# Patient Record
Sex: Female | Born: 1945 | Race: White | Hispanic: No | Smoking: Never smoker
Health system: Southern US, Community
[De-identification: ages and names within clinical notes are randomized; demographics above are authoritative.]

## PROBLEM LIST (undated history)

## (undated) DIAGNOSIS — M810 Age-related osteoporosis without current pathological fracture: Secondary | ICD-10-CM

## (undated) DIAGNOSIS — I8390 Asymptomatic varicose veins of unspecified lower extremity: Secondary | ICD-10-CM

## (undated) DIAGNOSIS — M609 Myositis, unspecified: Secondary | ICD-10-CM

## (undated) DIAGNOSIS — M76829 Posterior tibial tendinitis, unspecified leg: Secondary | ICD-10-CM

## (undated) DIAGNOSIS — R011 Cardiac murmur, unspecified: Secondary | ICD-10-CM

## (undated) DIAGNOSIS — L501 Idiopathic urticaria: Secondary | ICD-10-CM

## (undated) DIAGNOSIS — R7401 Elevation of levels of liver transaminase levels: Secondary | ICD-10-CM

## (undated) DIAGNOSIS — R918 Other nonspecific abnormal finding of lung field: Secondary | ICD-10-CM

## (undated) DIAGNOSIS — I10 Essential (primary) hypertension: Secondary | ICD-10-CM

## (undated) DIAGNOSIS — K760 Fatty (change of) liver, not elsewhere classified: Secondary | ICD-10-CM

## (undated) DIAGNOSIS — E7800 Pure hypercholesterolemia, unspecified: Secondary | ICD-10-CM

## (undated) DIAGNOSIS — E039 Hypothyroidism, unspecified: Secondary | ICD-10-CM

## (undated) DIAGNOSIS — D3A090 Benign carcinoid tumor of the bronchus and lung: Secondary | ICD-10-CM

## (undated) DIAGNOSIS — R7303 Prediabetes: Secondary | ICD-10-CM

## (undated) DIAGNOSIS — Z8719 Personal history of other diseases of the digestive system: Secondary | ICD-10-CM

## (undated) DIAGNOSIS — I739 Peripheral vascular disease, unspecified: Secondary | ICD-10-CM

## (undated) HISTORY — PX: KNEE SURGERY: SHX244

## (undated) HISTORY — DX: Other nonspecific abnormal finding of lung field: R91.8

## (undated) HISTORY — DX: Hypothyroidism, unspecified: E03.9

## (undated) HISTORY — DX: Cardiac murmur, unspecified: R01.1

## (undated) HISTORY — PX: REPLACEMENT TOTAL KNEE BILATERAL: SUR1225

## (undated) HISTORY — DX: Posterior tibial tendinitis, unspecified leg: M76.829

## (undated) HISTORY — PX: ABDOMINAL HYSTERECTOMY: SHX81

## (undated) HISTORY — PX: JOINT REPLACEMENT: SHX530

## (undated) HISTORY — DX: Benign carcinoid tumor of the bronchus and lung: D3A.090

## (undated) HISTORY — DX: Asymptomatic varicose veins of unspecified lower extremity: I83.90

## (undated) HISTORY — DX: Elevation of levels of liver transaminase levels: R74.01

## (undated) HISTORY — DX: Age-related osteoporosis without current pathological fracture: M81.0

## (undated) HISTORY — DX: Essential (primary) hypertension: I10

## (undated) HISTORY — DX: Idiopathic urticaria: L50.1

## (undated) HISTORY — DX: Prediabetes: R73.03

## (undated) HISTORY — PX: CHOLECYSTECTOMY: SHX55

## (undated) HISTORY — PX: TONSILLECTOMY: SUR1361

## (undated) HISTORY — PX: BUNIONECTOMY: SHX129

## (undated) HISTORY — DX: Fatty (change of) liver, not elsewhere classified: K76.0

## (undated) HISTORY — PX: SIGMOIDECTOMY: SHX176

## (undated) HISTORY — PX: COLONOSCOPY W/ POLYPECTOMY: SHX1380

## (undated) HISTORY — PX: TRANSTHORACIC ECHOCARDIOGRAM: SHX275

## (undated) HISTORY — DX: Myositis, unspecified: M60.9

## (undated) HISTORY — DX: Pure hypercholesterolemia, unspecified: E78.00

## (undated) HISTORY — PX: CT CTA CORONARY W/CA SCORE W/CM &/OR WO/CM: HXRAD787

---
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
## 2019-05-23 DIAGNOSIS — C801 Malignant (primary) neoplasm, unspecified: Secondary | ICD-10-CM

## 2019-05-23 HISTORY — PX: EYE SURGERY: SHX253

## 2019-05-23 HISTORY — DX: Malignant (primary) neoplasm, unspecified: C80.1

## 2019-06-27 DIAGNOSIS — H04123 Dry eye syndrome of bilateral lacrimal glands: Secondary | ICD-10-CM | POA: Diagnosis not present

## 2019-06-27 DIAGNOSIS — H527 Unspecified disorder of refraction: Secondary | ICD-10-CM | POA: Diagnosis not present

## 2019-06-27 DIAGNOSIS — Z961 Presence of intraocular lens: Secondary | ICD-10-CM | POA: Diagnosis not present

## 2019-06-27 DIAGNOSIS — D3131 Benign neoplasm of right choroid: Secondary | ICD-10-CM | POA: Diagnosis not present

## 2019-06-27 DIAGNOSIS — H26493 Other secondary cataract, bilateral: Secondary | ICD-10-CM | POA: Diagnosis not present

## 2019-07-08 DIAGNOSIS — M9901 Segmental and somatic dysfunction of cervical region: Secondary | ICD-10-CM | POA: Diagnosis not present

## 2019-07-08 DIAGNOSIS — M9905 Segmental and somatic dysfunction of pelvic region: Secondary | ICD-10-CM | POA: Diagnosis not present

## 2019-07-08 DIAGNOSIS — M9903 Segmental and somatic dysfunction of lumbar region: Secondary | ICD-10-CM | POA: Diagnosis not present

## 2019-07-08 DIAGNOSIS — M9904 Segmental and somatic dysfunction of sacral region: Secondary | ICD-10-CM | POA: Diagnosis not present

## 2019-07-09 DIAGNOSIS — M9905 Segmental and somatic dysfunction of pelvic region: Secondary | ICD-10-CM | POA: Diagnosis not present

## 2019-07-09 DIAGNOSIS — M9901 Segmental and somatic dysfunction of cervical region: Secondary | ICD-10-CM | POA: Diagnosis not present

## 2019-07-09 DIAGNOSIS — M9903 Segmental and somatic dysfunction of lumbar region: Secondary | ICD-10-CM | POA: Diagnosis not present

## 2019-07-09 DIAGNOSIS — M9904 Segmental and somatic dysfunction of sacral region: Secondary | ICD-10-CM | POA: Diagnosis not present

## 2019-07-09 DIAGNOSIS — L501 Idiopathic urticaria: Secondary | ICD-10-CM | POA: Diagnosis not present

## 2019-07-11 DIAGNOSIS — M9904 Segmental and somatic dysfunction of sacral region: Secondary | ICD-10-CM | POA: Diagnosis not present

## 2019-07-11 DIAGNOSIS — M9903 Segmental and somatic dysfunction of lumbar region: Secondary | ICD-10-CM | POA: Diagnosis not present

## 2019-07-11 DIAGNOSIS — M9905 Segmental and somatic dysfunction of pelvic region: Secondary | ICD-10-CM | POA: Diagnosis not present

## 2019-07-11 DIAGNOSIS — M9901 Segmental and somatic dysfunction of cervical region: Secondary | ICD-10-CM | POA: Diagnosis not present

## 2019-07-14 DIAGNOSIS — M9901 Segmental and somatic dysfunction of cervical region: Secondary | ICD-10-CM | POA: Diagnosis not present

## 2019-07-14 DIAGNOSIS — M9903 Segmental and somatic dysfunction of lumbar region: Secondary | ICD-10-CM | POA: Diagnosis not present

## 2019-07-14 DIAGNOSIS — M9904 Segmental and somatic dysfunction of sacral region: Secondary | ICD-10-CM | POA: Diagnosis not present

## 2019-07-14 DIAGNOSIS — M9905 Segmental and somatic dysfunction of pelvic region: Secondary | ICD-10-CM | POA: Diagnosis not present

## 2019-07-16 DIAGNOSIS — M9901 Segmental and somatic dysfunction of cervical region: Secondary | ICD-10-CM | POA: Diagnosis not present

## 2019-07-16 DIAGNOSIS — M9904 Segmental and somatic dysfunction of sacral region: Secondary | ICD-10-CM | POA: Diagnosis not present

## 2019-07-16 DIAGNOSIS — M9903 Segmental and somatic dysfunction of lumbar region: Secondary | ICD-10-CM | POA: Diagnosis not present

## 2019-07-16 DIAGNOSIS — M9905 Segmental and somatic dysfunction of pelvic region: Secondary | ICD-10-CM | POA: Diagnosis not present

## 2019-07-17 DIAGNOSIS — M9901 Segmental and somatic dysfunction of cervical region: Secondary | ICD-10-CM | POA: Diagnosis not present

## 2019-07-17 DIAGNOSIS — M9905 Segmental and somatic dysfunction of pelvic region: Secondary | ICD-10-CM | POA: Diagnosis not present

## 2019-07-17 DIAGNOSIS — M9903 Segmental and somatic dysfunction of lumbar region: Secondary | ICD-10-CM | POA: Diagnosis not present

## 2019-07-17 DIAGNOSIS — M9904 Segmental and somatic dysfunction of sacral region: Secondary | ICD-10-CM | POA: Diagnosis not present

## 2019-07-22 DIAGNOSIS — M9903 Segmental and somatic dysfunction of lumbar region: Secondary | ICD-10-CM | POA: Diagnosis not present

## 2019-07-22 DIAGNOSIS — M9901 Segmental and somatic dysfunction of cervical region: Secondary | ICD-10-CM | POA: Diagnosis not present

## 2019-07-22 DIAGNOSIS — M9905 Segmental and somatic dysfunction of pelvic region: Secondary | ICD-10-CM | POA: Diagnosis not present

## 2019-07-22 DIAGNOSIS — M9904 Segmental and somatic dysfunction of sacral region: Secondary | ICD-10-CM | POA: Diagnosis not present

## 2019-07-24 DIAGNOSIS — M9901 Segmental and somatic dysfunction of cervical region: Secondary | ICD-10-CM | POA: Diagnosis not present

## 2019-07-24 DIAGNOSIS — M9905 Segmental and somatic dysfunction of pelvic region: Secondary | ICD-10-CM | POA: Diagnosis not present

## 2019-07-24 DIAGNOSIS — M9904 Segmental and somatic dysfunction of sacral region: Secondary | ICD-10-CM | POA: Diagnosis not present

## 2019-07-24 DIAGNOSIS — M9903 Segmental and somatic dysfunction of lumbar region: Secondary | ICD-10-CM | POA: Diagnosis not present

## 2019-07-25 DIAGNOSIS — M9903 Segmental and somatic dysfunction of lumbar region: Secondary | ICD-10-CM | POA: Diagnosis not present

## 2019-07-25 DIAGNOSIS — M9904 Segmental and somatic dysfunction of sacral region: Secondary | ICD-10-CM | POA: Diagnosis not present

## 2019-07-25 DIAGNOSIS — M9905 Segmental and somatic dysfunction of pelvic region: Secondary | ICD-10-CM | POA: Diagnosis not present

## 2019-07-25 DIAGNOSIS — M9901 Segmental and somatic dysfunction of cervical region: Secondary | ICD-10-CM | POA: Diagnosis not present

## 2019-07-29 DIAGNOSIS — M9905 Segmental and somatic dysfunction of pelvic region: Secondary | ICD-10-CM | POA: Diagnosis not present

## 2019-07-29 DIAGNOSIS — M9904 Segmental and somatic dysfunction of sacral region: Secondary | ICD-10-CM | POA: Diagnosis not present

## 2019-07-29 DIAGNOSIS — M9903 Segmental and somatic dysfunction of lumbar region: Secondary | ICD-10-CM | POA: Diagnosis not present

## 2019-07-29 DIAGNOSIS — M9901 Segmental and somatic dysfunction of cervical region: Secondary | ICD-10-CM | POA: Diagnosis not present

## 2019-07-30 DIAGNOSIS — M1990 Unspecified osteoarthritis, unspecified site: Secondary | ICD-10-CM | POA: Diagnosis not present

## 2019-07-30 DIAGNOSIS — Z1211 Encounter for screening for malignant neoplasm of colon: Secondary | ICD-10-CM | POA: Diagnosis not present

## 2019-07-31 DIAGNOSIS — M9904 Segmental and somatic dysfunction of sacral region: Secondary | ICD-10-CM | POA: Diagnosis not present

## 2019-07-31 DIAGNOSIS — M9901 Segmental and somatic dysfunction of cervical region: Secondary | ICD-10-CM | POA: Diagnosis not present

## 2019-07-31 DIAGNOSIS — Z1211 Encounter for screening for malignant neoplasm of colon: Secondary | ICD-10-CM | POA: Diagnosis not present

## 2019-07-31 DIAGNOSIS — M9903 Segmental and somatic dysfunction of lumbar region: Secondary | ICD-10-CM | POA: Diagnosis not present

## 2019-07-31 DIAGNOSIS — M9905 Segmental and somatic dysfunction of pelvic region: Secondary | ICD-10-CM | POA: Diagnosis not present

## 2019-08-01 DIAGNOSIS — H26493 Other secondary cataract, bilateral: Secondary | ICD-10-CM | POA: Diagnosis not present

## 2019-08-04 DIAGNOSIS — M9904 Segmental and somatic dysfunction of sacral region: Secondary | ICD-10-CM | POA: Diagnosis not present

## 2019-08-04 DIAGNOSIS — M9905 Segmental and somatic dysfunction of pelvic region: Secondary | ICD-10-CM | POA: Diagnosis not present

## 2019-08-04 DIAGNOSIS — M9901 Segmental and somatic dysfunction of cervical region: Secondary | ICD-10-CM | POA: Diagnosis not present

## 2019-08-04 DIAGNOSIS — M9903 Segmental and somatic dysfunction of lumbar region: Secondary | ICD-10-CM | POA: Diagnosis not present

## 2019-08-06 DIAGNOSIS — L501 Idiopathic urticaria: Secondary | ICD-10-CM | POA: Diagnosis not present

## 2019-08-07 DIAGNOSIS — M9905 Segmental and somatic dysfunction of pelvic region: Secondary | ICD-10-CM | POA: Diagnosis not present

## 2019-08-07 DIAGNOSIS — M9904 Segmental and somatic dysfunction of sacral region: Secondary | ICD-10-CM | POA: Diagnosis not present

## 2019-08-07 DIAGNOSIS — M9903 Segmental and somatic dysfunction of lumbar region: Secondary | ICD-10-CM | POA: Diagnosis not present

## 2019-08-07 DIAGNOSIS — M9901 Segmental and somatic dysfunction of cervical region: Secondary | ICD-10-CM | POA: Diagnosis not present

## 2019-08-08 DIAGNOSIS — H26491 Other secondary cataract, right eye: Secondary | ICD-10-CM | POA: Diagnosis not present

## 2019-08-11 DIAGNOSIS — M9904 Segmental and somatic dysfunction of sacral region: Secondary | ICD-10-CM | POA: Diagnosis not present

## 2019-08-11 DIAGNOSIS — M9901 Segmental and somatic dysfunction of cervical region: Secondary | ICD-10-CM | POA: Diagnosis not present

## 2019-08-11 DIAGNOSIS — M9903 Segmental and somatic dysfunction of lumbar region: Secondary | ICD-10-CM | POA: Diagnosis not present

## 2019-08-11 DIAGNOSIS — M9905 Segmental and somatic dysfunction of pelvic region: Secondary | ICD-10-CM | POA: Diagnosis not present

## 2019-08-14 DIAGNOSIS — M9904 Segmental and somatic dysfunction of sacral region: Secondary | ICD-10-CM | POA: Diagnosis not present

## 2019-08-14 DIAGNOSIS — M9903 Segmental and somatic dysfunction of lumbar region: Secondary | ICD-10-CM | POA: Diagnosis not present

## 2019-08-14 DIAGNOSIS — M9901 Segmental and somatic dysfunction of cervical region: Secondary | ICD-10-CM | POA: Diagnosis not present

## 2019-08-14 DIAGNOSIS — M9905 Segmental and somatic dysfunction of pelvic region: Secondary | ICD-10-CM | POA: Diagnosis not present

## 2019-08-18 DIAGNOSIS — M9905 Segmental and somatic dysfunction of pelvic region: Secondary | ICD-10-CM | POA: Diagnosis not present

## 2019-08-18 DIAGNOSIS — M9903 Segmental and somatic dysfunction of lumbar region: Secondary | ICD-10-CM | POA: Diagnosis not present

## 2019-08-18 DIAGNOSIS — M9904 Segmental and somatic dysfunction of sacral region: Secondary | ICD-10-CM | POA: Diagnosis not present

## 2019-08-18 DIAGNOSIS — M9901 Segmental and somatic dysfunction of cervical region: Secondary | ICD-10-CM | POA: Diagnosis not present

## 2019-08-19 DIAGNOSIS — H26492 Other secondary cataract, left eye: Secondary | ICD-10-CM | POA: Diagnosis not present

## 2019-08-21 DIAGNOSIS — M9903 Segmental and somatic dysfunction of lumbar region: Secondary | ICD-10-CM | POA: Diagnosis not present

## 2019-08-21 DIAGNOSIS — M9901 Segmental and somatic dysfunction of cervical region: Secondary | ICD-10-CM | POA: Diagnosis not present

## 2019-08-21 DIAGNOSIS — M9905 Segmental and somatic dysfunction of pelvic region: Secondary | ICD-10-CM | POA: Diagnosis not present

## 2019-08-21 DIAGNOSIS — M9904 Segmental and somatic dysfunction of sacral region: Secondary | ICD-10-CM | POA: Diagnosis not present

## 2019-08-25 DIAGNOSIS — M9903 Segmental and somatic dysfunction of lumbar region: Secondary | ICD-10-CM | POA: Diagnosis not present

## 2019-08-25 DIAGNOSIS — M9901 Segmental and somatic dysfunction of cervical region: Secondary | ICD-10-CM | POA: Diagnosis not present

## 2019-08-25 DIAGNOSIS — M9904 Segmental and somatic dysfunction of sacral region: Secondary | ICD-10-CM | POA: Diagnosis not present

## 2019-08-25 DIAGNOSIS — M9905 Segmental and somatic dysfunction of pelvic region: Secondary | ICD-10-CM | POA: Diagnosis not present

## 2019-08-28 DIAGNOSIS — M9905 Segmental and somatic dysfunction of pelvic region: Secondary | ICD-10-CM | POA: Diagnosis not present

## 2019-08-28 DIAGNOSIS — M9904 Segmental and somatic dysfunction of sacral region: Secondary | ICD-10-CM | POA: Diagnosis not present

## 2019-08-28 DIAGNOSIS — M9903 Segmental and somatic dysfunction of lumbar region: Secondary | ICD-10-CM | POA: Diagnosis not present

## 2019-08-28 DIAGNOSIS — M9901 Segmental and somatic dysfunction of cervical region: Secondary | ICD-10-CM | POA: Diagnosis not present

## 2019-09-01 DIAGNOSIS — M9905 Segmental and somatic dysfunction of pelvic region: Secondary | ICD-10-CM | POA: Diagnosis not present

## 2019-09-01 DIAGNOSIS — M9901 Segmental and somatic dysfunction of cervical region: Secondary | ICD-10-CM | POA: Diagnosis not present

## 2019-09-01 DIAGNOSIS — M9903 Segmental and somatic dysfunction of lumbar region: Secondary | ICD-10-CM | POA: Diagnosis not present

## 2019-09-01 DIAGNOSIS — M9904 Segmental and somatic dysfunction of sacral region: Secondary | ICD-10-CM | POA: Diagnosis not present

## 2019-09-03 DIAGNOSIS — L501 Idiopathic urticaria: Secondary | ICD-10-CM | POA: Diagnosis not present

## 2019-09-04 DIAGNOSIS — M9904 Segmental and somatic dysfunction of sacral region: Secondary | ICD-10-CM | POA: Diagnosis not present

## 2019-09-04 DIAGNOSIS — M9905 Segmental and somatic dysfunction of pelvic region: Secondary | ICD-10-CM | POA: Diagnosis not present

## 2019-09-04 DIAGNOSIS — M9903 Segmental and somatic dysfunction of lumbar region: Secondary | ICD-10-CM | POA: Diagnosis not present

## 2019-09-04 DIAGNOSIS — M9901 Segmental and somatic dysfunction of cervical region: Secondary | ICD-10-CM | POA: Diagnosis not present

## 2019-09-04 DIAGNOSIS — D3613 Benign neoplasm of peripheral nerves and autonomic nervous system of lower limb, including hip: Secondary | ICD-10-CM | POA: Diagnosis not present

## 2019-09-08 DIAGNOSIS — M9905 Segmental and somatic dysfunction of pelvic region: Secondary | ICD-10-CM | POA: Diagnosis not present

## 2019-09-08 DIAGNOSIS — M9904 Segmental and somatic dysfunction of sacral region: Secondary | ICD-10-CM | POA: Diagnosis not present

## 2019-09-08 DIAGNOSIS — M9903 Segmental and somatic dysfunction of lumbar region: Secondary | ICD-10-CM | POA: Diagnosis not present

## 2019-09-08 DIAGNOSIS — M9901 Segmental and somatic dysfunction of cervical region: Secondary | ICD-10-CM | POA: Diagnosis not present

## 2019-09-09 DIAGNOSIS — R195 Other fecal abnormalities: Secondary | ICD-10-CM | POA: Diagnosis not present

## 2019-09-09 DIAGNOSIS — Z9049 Acquired absence of other specified parts of digestive tract: Secondary | ICD-10-CM | POA: Diagnosis not present

## 2019-09-09 DIAGNOSIS — K56699 Other intestinal obstruction unspecified as to partial versus complete obstruction: Secondary | ICD-10-CM | POA: Diagnosis not present

## 2019-09-11 DIAGNOSIS — M9904 Segmental and somatic dysfunction of sacral region: Secondary | ICD-10-CM | POA: Diagnosis not present

## 2019-09-11 DIAGNOSIS — M9905 Segmental and somatic dysfunction of pelvic region: Secondary | ICD-10-CM | POA: Diagnosis not present

## 2019-09-11 DIAGNOSIS — M9903 Segmental and somatic dysfunction of lumbar region: Secondary | ICD-10-CM | POA: Diagnosis not present

## 2019-09-11 DIAGNOSIS — M9901 Segmental and somatic dysfunction of cervical region: Secondary | ICD-10-CM | POA: Diagnosis not present

## 2019-09-15 DIAGNOSIS — M9905 Segmental and somatic dysfunction of pelvic region: Secondary | ICD-10-CM | POA: Diagnosis not present

## 2019-09-15 DIAGNOSIS — M9904 Segmental and somatic dysfunction of sacral region: Secondary | ICD-10-CM | POA: Diagnosis not present

## 2019-09-15 DIAGNOSIS — M9903 Segmental and somatic dysfunction of lumbar region: Secondary | ICD-10-CM | POA: Diagnosis not present

## 2019-09-15 DIAGNOSIS — M9901 Segmental and somatic dysfunction of cervical region: Secondary | ICD-10-CM | POA: Diagnosis not present

## 2019-09-16 ENCOUNTER — Other Ambulatory Visit: Payer: Self-pay | Admitting: Gastroenterology

## 2019-09-16 DIAGNOSIS — R195 Other fecal abnormalities: Secondary | ICD-10-CM

## 2019-09-18 DIAGNOSIS — M9905 Segmental and somatic dysfunction of pelvic region: Secondary | ICD-10-CM | POA: Diagnosis not present

## 2019-09-18 DIAGNOSIS — M9901 Segmental and somatic dysfunction of cervical region: Secondary | ICD-10-CM | POA: Diagnosis not present

## 2019-09-18 DIAGNOSIS — M9903 Segmental and somatic dysfunction of lumbar region: Secondary | ICD-10-CM | POA: Diagnosis not present

## 2019-09-18 DIAGNOSIS — M9904 Segmental and somatic dysfunction of sacral region: Secondary | ICD-10-CM | POA: Diagnosis not present

## 2019-09-22 DIAGNOSIS — M9905 Segmental and somatic dysfunction of pelvic region: Secondary | ICD-10-CM | POA: Diagnosis not present

## 2019-09-22 DIAGNOSIS — M9904 Segmental and somatic dysfunction of sacral region: Secondary | ICD-10-CM | POA: Diagnosis not present

## 2019-09-22 DIAGNOSIS — M9903 Segmental and somatic dysfunction of lumbar region: Secondary | ICD-10-CM | POA: Diagnosis not present

## 2019-09-22 DIAGNOSIS — M9901 Segmental and somatic dysfunction of cervical region: Secondary | ICD-10-CM | POA: Diagnosis not present

## 2019-09-24 DIAGNOSIS — M9904 Segmental and somatic dysfunction of sacral region: Secondary | ICD-10-CM | POA: Diagnosis not present

## 2019-09-24 DIAGNOSIS — M9903 Segmental and somatic dysfunction of lumbar region: Secondary | ICD-10-CM | POA: Diagnosis not present

## 2019-09-24 DIAGNOSIS — M9901 Segmental and somatic dysfunction of cervical region: Secondary | ICD-10-CM | POA: Diagnosis not present

## 2019-09-24 DIAGNOSIS — M9905 Segmental and somatic dysfunction of pelvic region: Secondary | ICD-10-CM | POA: Diagnosis not present

## 2019-09-25 DIAGNOSIS — H35372 Puckering of macula, left eye: Secondary | ICD-10-CM | POA: Diagnosis not present

## 2019-09-25 DIAGNOSIS — H527 Unspecified disorder of refraction: Secondary | ICD-10-CM | POA: Diagnosis not present

## 2019-09-25 DIAGNOSIS — H26493 Other secondary cataract, bilateral: Secondary | ICD-10-CM | POA: Diagnosis not present

## 2019-09-29 DIAGNOSIS — M9903 Segmental and somatic dysfunction of lumbar region: Secondary | ICD-10-CM | POA: Diagnosis not present

## 2019-09-29 DIAGNOSIS — M9904 Segmental and somatic dysfunction of sacral region: Secondary | ICD-10-CM | POA: Diagnosis not present

## 2019-09-29 DIAGNOSIS — M9901 Segmental and somatic dysfunction of cervical region: Secondary | ICD-10-CM | POA: Diagnosis not present

## 2019-09-29 DIAGNOSIS — M9905 Segmental and somatic dysfunction of pelvic region: Secondary | ICD-10-CM | POA: Diagnosis not present

## 2019-10-01 ENCOUNTER — Ambulatory Visit: Payer: Medicare PPO | Admitting: Podiatry

## 2019-10-01 DIAGNOSIS — L501 Idiopathic urticaria: Secondary | ICD-10-CM | POA: Diagnosis not present

## 2019-10-02 ENCOUNTER — Ambulatory Visit
Admission: RE | Admit: 2019-10-02 | Discharge: 2019-10-02 | Disposition: A | Discharge status: Home / Self Care | Payer: Medicare PPO | Source: Ambulatory Visit | Attending: Gastroenterology | Admitting: Gastroenterology

## 2019-10-02 DIAGNOSIS — R195 Other fecal abnormalities: Secondary | ICD-10-CM

## 2019-10-02 DIAGNOSIS — K5730 Diverticulosis of large intestine without perforation or abscess without bleeding: Secondary | ICD-10-CM | POA: Diagnosis not present

## 2019-10-03 ENCOUNTER — Other Ambulatory Visit: Payer: Self-pay | Admitting: Gastroenterology

## 2019-10-03 DIAGNOSIS — R933 Abnormal findings on diagnostic imaging of other parts of digestive tract: Secondary | ICD-10-CM

## 2019-10-03 DIAGNOSIS — R918 Other nonspecific abnormal finding of lung field: Secondary | ICD-10-CM

## 2019-10-06 ENCOUNTER — Other Ambulatory Visit: Payer: Self-pay | Admitting: Gastroenterology

## 2019-10-06 ENCOUNTER — Ambulatory Visit
Admission: RE | Admit: 2019-10-06 | Discharge: 2019-10-06 | Disposition: A | Discharge status: Home / Self Care | Payer: Medicare PPO | Source: Ambulatory Visit | Attending: Gastroenterology | Admitting: Gastroenterology

## 2019-10-06 DIAGNOSIS — R918 Other nonspecific abnormal finding of lung field: Secondary | ICD-10-CM

## 2019-10-06 DIAGNOSIS — M9904 Segmental and somatic dysfunction of sacral region: Secondary | ICD-10-CM | POA: Diagnosis not present

## 2019-10-06 DIAGNOSIS — R911 Solitary pulmonary nodule: Secondary | ICD-10-CM | POA: Diagnosis not present

## 2019-10-06 DIAGNOSIS — R9389 Abnormal findings on diagnostic imaging of other specified body structures: Secondary | ICD-10-CM

## 2019-10-06 DIAGNOSIS — M9901 Segmental and somatic dysfunction of cervical region: Secondary | ICD-10-CM | POA: Diagnosis not present

## 2019-10-06 DIAGNOSIS — M9905 Segmental and somatic dysfunction of pelvic region: Secondary | ICD-10-CM | POA: Diagnosis not present

## 2019-10-06 DIAGNOSIS — R933 Abnormal findings on diagnostic imaging of other parts of digestive tract: Secondary | ICD-10-CM

## 2019-10-06 DIAGNOSIS — M9903 Segmental and somatic dysfunction of lumbar region: Secondary | ICD-10-CM | POA: Diagnosis not present

## 2019-10-06 MED ORDER — IOPAMIDOL (ISOVUE-300) INJECTION 61%
75.0000 mL | Freq: Once | INTRAVENOUS | Status: AC | PRN
  Administered 2019-10-06: 75 mL via INTRAVENOUS

## 2019-10-10 ENCOUNTER — Other Ambulatory Visit: Payer: Self-pay

## 2019-10-10 ENCOUNTER — Encounter: Payer: Self-pay | Admitting: Emergency Medicine

## 2019-10-10 ENCOUNTER — Ambulatory Visit (INDEPENDENT_AMBULATORY_CARE_PROVIDER_SITE_OTHER): Payer: Medicare PPO | Admitting: Emergency Medicine

## 2019-10-10 VITALS — BP 124/72 | HR 67 | Temp 97.5°F | Ht 66.0 in | Wt 155.0 lb

## 2019-10-10 DIAGNOSIS — R918 Other nonspecific abnormal finding of lung field: Secondary | ICD-10-CM | POA: Diagnosis not present

## 2019-10-10 DIAGNOSIS — R911 Solitary pulmonary nodule: Secondary | ICD-10-CM

## 2019-10-10 NOTE — Patient Instructions (Addendum)
Agree with your PET scan as planned We will arrange for navigational bronchoscopy by Dr. Lamonte Sakai to better characterize  right middle lobe nodule We will discuss maintenance of Xolair going forward Follow with Dr Lamonte Sakai in 1 month

## 2019-10-10 NOTE — Assessment & Plan Note (Signed)
2.8 x 1.6 right middle lobe nodule, 7 x 5 mm right upper lobe nodule concerning for malignancy.  Agree with the PET scan that is already scheduled for 10/16/2019.  I believe we need to proceed with scheduling navigational bronchoscopy to approach the right middle lobe lesion.  If the PET scan reveals an alternative target then we will cancel the ENB.  Procedure explained to the patient including risk and benefits.  She agrees to proceed with scheduling.

## 2019-10-10 NOTE — H&P (View-Only) (Signed)
Subjective:    Patient ID: Cynthia Rhodes, female    DOB: [birth year], 74 y.o.   MRN: [id number]  HPI 74 year old never smoker with a history of hypothyroidism and allergic disease on Xolair, scheduled to have another injection in June, being managed Dr Curly Rim.  She is referred today for evaluation of a 3.3 lobulated anterior right middle lobe mass that was originally seen on virtual colonoscopy 10/02/2019.  This prompted a CT chest done on 10/06/2019 which I have reviewed, shows a 2.8 x 1.6 spiculated right middle lobe mass concerning for primary malignancy.  Also noted was a 7 x 5 mm nodule in the right upper lobe.  A PET scan has been ordered and is planned for 5/27.     Review of Systems  Past Medical History:  Diagnosis Date  . Hypothyroidism      Family History  Problem Relation Age of Onset  . Asthma Mother   . 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   Social History   Socioeconomic History  . Marital status: Widowed    Spouse name: Not on file  . Number of children: Not on file  . Years of education: Not on file  . Highest education level: Not on file  Occupational History  . Not on file  Tobacco Use  . Smoking status: Never Smoker  . Smokeless tobacco: Never Used  Substance and Sexual Activity  . Alcohol use: Not on file  . Drug use: Not on file  . Sexual activity: Not on file  Other Topics Concern  . Not on file  Social History Narrative  . Not on file   Social Determinants of Health   Financial Resource Strain:   . Difficulty of Paying Living Expenses:   Food Insecurity:   . Worried About Charity fundraiser in the Last Year:   . Arboriculturist in the Last Year:   Transportation Needs:   . Film/video editor (Medical):   [name] Lack of Transportation (Non-Medical):   Physical Activity:   . Days of Exercise per Week:   . Minutes of Exercise per Session:   Stress:   .  Feeling of Stress :   Social Connections:   . Frequency of Communication with Friends and Family:   . Frequency of Social Gatherings with Friends and Family:   . Attends Religious Services:   . Active Member of Clubs or Organizations:   . Attends Archivist Meetings:   [name] Marital Status:   Intimate Partner Violence:   . Fear of Current or Ex-Partner:   . Emotionally Abused:   [name] Physically Abused:   . Sexually Abused:      Not on File   Outpatient Medications Prior to Visit  Medication Sig Dispense Refill  . levothyroxine (SYNTHROID) 50 MCG tablet Take 50 mcg by mouth daily. Every other day     No facility-administered medications prior to visit.        Objective:   Physical Exam Vitals:   10/10/19 1003  BP: 124/72  Pulse: 67  Temp: (!) 97.5 F (36.4 C)  TempSrc: Temporal  SpO2: 98%  Weight: 155 lb (70.3 kg)  Height: 5\' 6"  (1.676 m)   Gen: Pleasant, well-nourished, in no distress,  normal affect  ENT: No lesions,  mouth clear,  oropharynx clear, no postnasal drip  Neck: No JVD, no  stridor  Lungs: No use of accessory muscles, no crackles or wheezing on normal respiration, no wheeze on forced expiration  Cardiovascular: RRR, heart sounds normal, no murmur or gallops, no peripheral edema  Musculoskeletal: No deformities, no cyanosis or clubbing  Neuro: alert, awake, non focal  Skin: Warm, no lesions or rash       Assessment & Plan:  Pulmonary nodules 2.8 x 1.6 right middle lobe nodule, 7 x 5 mm right upper lobe nodule concerning for malignancy.  Agree with the PET scan that is already scheduled for 10/16/2019.  I believe we need to proceed with scheduling navigational bronchoscopy to approach the right middle lobe lesion.  If the PET scan reveals an alternative target then we will cancel the ENB.  Procedure explained to the patient including risk and benefits.  She agrees to proceed with scheduling.  Baltazar Apo, MD, PhD 10/10/2019, 5:14 PM [place]  [facility] [phone] or if no answer [phone]

## 2019-10-10 NOTE — Progress Notes (Signed)
Subjective:    Patient ID: Cynthia Rhodes, female    DOB: [birth year], 74 y.o.   MRN: [id number]  HPI 74 year old never smoker with a history of hypothyroidism and allergic disease on Xolair, scheduled to have another injection in June, being managed Dr Curly Rim.  She is referred today for evaluation of a 3.3 lobulated anterior right middle lobe mass that was originally seen on virtual colonoscopy 10/02/2019.  This prompted a CT chest done on 10/06/2019 which I have reviewed, shows a 2.8 x 1.6 spiculated right middle lobe mass concerning for primary malignancy.  Also noted was a 7 x 5 mm nodule in the right upper lobe.  A PET scan has been ordered and is planned for 5/27.     Review of Systems  Past Medical History:  Diagnosis Date  . Hypothyroidism      Family History  Problem Relation Age of Onset  . Asthma Mother   . 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   Social History   Socioeconomic History  . Marital status: Widowed    Spouse name: Not on file  . Number of children: Not on file  . Years of education: Not on file  . Highest education level: Not on file  Occupational History  . Not on file  Tobacco Use  . Smoking status: Never Smoker  . Smokeless tobacco: Never Used  Substance and Sexual Activity  . Alcohol use: Not on file  . Drug use: Not on file  . Sexual activity: Not on file  Other Topics Concern  . Not on file  Social History Narrative  . Not on file   Social Determinants of Health   Financial Resource Strain:   . Difficulty of Paying Living Expenses:   Food Insecurity:   . Worried About Charity fundraiser in the Last Year:   . Arboriculturist in the Last Year:   Transportation Needs:   . Film/video editor (Medical):   [name] Lack of Transportation (Non-Medical):   Physical Activity:   . Days of Exercise per Week:   . Minutes of Exercise per Session:   Stress:   .  Feeling of Stress :   Social Connections:   . Frequency of Communication with Friends and Family:   . Frequency of Social Gatherings with Friends and Family:   . Attends Religious Services:   . Active Member of Clubs or Organizations:   . Attends Archivist Meetings:   [name] Marital Status:   Intimate Partner Violence:   . Fear of Current or Ex-Partner:   . Emotionally Abused:   [name] Physically Abused:   . Sexually Abused:      Not on File   Outpatient Medications Prior to Visit  Medication Sig Dispense Refill  . levothyroxine (SYNTHROID) 50 MCG tablet Take 50 mcg by mouth daily. Every other day     No facility-administered medications prior to visit.        Objective:   Physical Exam Vitals:   10/10/19 1003  BP: 124/72  Pulse: 67  Temp: (!) 97.5 F (36.4 C)  TempSrc: Temporal  SpO2: 98%  Weight: 155 lb (70.3 kg)  Height: 5\' 6"  (1.676 m)   Gen: Pleasant, well-nourished, in no distress,  normal affect  ENT: No lesions,  mouth clear,  oropharynx clear, no postnasal drip  Neck: No JVD, no  stridor  Lungs: No use of accessory muscles, no crackles or wheezing on normal respiration, no wheeze on forced expiration  Cardiovascular: RRR, heart sounds normal, no murmur or gallops, no peripheral edema  Musculoskeletal: No deformities, no cyanosis or clubbing  Neuro: alert, awake, non focal  Skin: Warm, no lesions or rash       Assessment & Plan:  Pulmonary nodules 2.8 x 1.6 right middle lobe nodule, 7 x 5 mm right upper lobe nodule concerning for malignancy.  Agree with the PET scan that is already scheduled for 10/16/2019.  I believe we need to proceed with scheduling navigational bronchoscopy to approach the right middle lobe lesion.  If the PET scan reveals an alternative target then we will cancel the ENB.  Procedure explained to the patient including risk and benefits.  She agrees to proceed with scheduling.  Baltazar Apo, MD, PhD 10/10/2019, 5:14 PM [place]  [facility] [phone] or if no answer [phone]

## 2019-10-15 ENCOUNTER — Other Ambulatory Visit: Payer: Self-pay | Admitting: Family Medicine

## 2019-10-15 DIAGNOSIS — Z13820 Encounter for screening for osteoporosis: Secondary | ICD-10-CM

## 2019-10-16 ENCOUNTER — Other Ambulatory Visit: Payer: Self-pay

## 2019-10-16 ENCOUNTER — Encounter (HOSPITAL_COMMUNITY)
Admission: RE | Admit: 2019-10-16 | Discharge: 2019-10-16 | Disposition: A | Discharge status: Home / Self Care | Payer: Medicare PPO | Source: Ambulatory Visit | Attending: Gastroenterology | Admitting: Gastroenterology

## 2019-10-16 DIAGNOSIS — R918 Other nonspecific abnormal finding of lung field: Secondary | ICD-10-CM | POA: Diagnosis not present

## 2019-10-16 DIAGNOSIS — R9389 Abnormal findings on diagnostic imaging of other specified body structures: Secondary | ICD-10-CM

## 2019-10-16 LAB — GLUCOSE, CAPILLARY: Glucose-Capillary: 111 mg/dL — ABNORMAL HIGH (ref 70–99)

## 2019-10-16 MED ORDER — FLUDEOXYGLUCOSE F - 18 (FDG) INJECTION
7.7000 | Freq: Once | INTRAVENOUS | Status: AC
  Administered 2019-10-16: 7.7 via INTRAVENOUS

## 2019-10-21 ENCOUNTER — Other Ambulatory Visit: Payer: Self-pay | Admitting: Gastroenterology

## 2019-10-21 ENCOUNTER — Telehealth: Payer: Self-pay | Admitting: Emergency Medicine

## 2019-10-21 DIAGNOSIS — R9389 Abnormal findings on diagnostic imaging of other specified body structures: Secondary | ICD-10-CM

## 2019-10-21 NOTE — Telephone Encounter (Signed)
I agree - she should get the mammogram

## 2019-10-21 NOTE — Telephone Encounter (Signed)
Pt is scheduled for routine mammogram  Recently had PET scan  Wants to know if still needs to have mammogram  I asked that she call her PCP but she states her PCP is out of the office for a while I advised most likely will need to keep mammo bc this is much different from PET and focuses specifically on the breast  Please advise thanks!

## 2019-10-22 ENCOUNTER — Encounter: Payer: Self-pay | Admitting: Podiatry

## 2019-10-22 ENCOUNTER — Ambulatory Visit (INDEPENDENT_AMBULATORY_CARE_PROVIDER_SITE_OTHER): Payer: Medicare PPO

## 2019-10-22 ENCOUNTER — Ambulatory Visit: Payer: Medicare PPO

## 2019-10-22 ENCOUNTER — Ambulatory Visit (INDEPENDENT_AMBULATORY_CARE_PROVIDER_SITE_OTHER): Payer: Medicare PPO | Admitting: Podiatry

## 2019-10-22 ENCOUNTER — Other Ambulatory Visit: Payer: Self-pay

## 2019-10-22 DIAGNOSIS — G5762 Lesion of plantar nerve, left lower limb: Secondary | ICD-10-CM

## 2019-10-22 MED ORDER — MELOXICAM 15 MG PO TABS
15.0000 mg | ORAL_TABLET | Freq: Every day | ORAL | 1 refills | Status: DC
Start: 2019-10-22 — End: 2019-12-10

## 2019-10-22 NOTE — Telephone Encounter (Signed)
Called spoke with patient, let her know Dr. Sudie Bailey' recommendations, she states she will get the mammogram rescheduled ASAP   Nothing further needed at this time

## 2019-10-26 NOTE — Progress Notes (Signed)
   HPI: 74 y.o. female presenting today as a new patient for evaluation of a burning sensation on the left forefoot.  This is been ongoing for approximately 1 year now.  Had a sudden onset.  Patient denies injury.  Aggravated when she is walking she has the burning sensation.  She has tried orthotics which helped temporarily.  Past Medical History:  Diagnosis Date  . Hypothyroidism      Physical Exam: General: The patient is alert and oriented x3 in no acute distress.  Dermatology: Skin is warm, dry and supple bilateral lower extremities. Negative for open lesions or macerations.  Vascular: Palpable pedal pulses bilaterally. No edema or erythema noted. Capillary refill within normal limits.  Neurological: Epicritic and protective threshold grossly intact bilaterally.   Musculoskeletal Exam: Range of motion within normal limits to all pedal and ankle joints bilateral. Muscle strength 5/5 in all groups bilateral.  Pain on palpation noted to the left second interspace as well as pain with lateral compression and a positive Mulder sign.  Findings consistent with a Morton's neuroma left second interspace  Radiographic Exam:  Normal osseous mineralization. Joint spaces preserved. No fracture/dislocation/boney destruction.    Assessment: 1.  Morton's neuroma left second interspace   Plan of Care:  1. Patient evaluated. X-Rays reviewed.  2.  Injection of 0.5 cc Celestone Soluspan injection of the second interspace left foot 3.  Prescription for meloxicam 15 mg take daily 4.  Offloading metatarsal pads were provided and applied to the insoles 5.  Return to clinic in 4 weeks      Edrick Kins, DPM [facility]  Dr. Edrick Kins, DPM    [address], [place] [address]                Office [phone]  Fax [phone]

## 2019-10-29 ENCOUNTER — Ambulatory Visit
Admission: RE | Admit: 2019-10-29 | Discharge: 2019-10-29 | Disposition: A | Discharge status: Home / Self Care | Payer: Medicare PPO | Source: Ambulatory Visit | Attending: Gastroenterology | Admitting: Gastroenterology

## 2019-10-29 DIAGNOSIS — L501 Idiopathic urticaria: Secondary | ICD-10-CM | POA: Diagnosis not present

## 2019-10-29 DIAGNOSIS — E041 Nontoxic single thyroid nodule: Secondary | ICD-10-CM | POA: Diagnosis not present

## 2019-10-29 DIAGNOSIS — R9389 Abnormal findings on diagnostic imaging of other specified body structures: Secondary | ICD-10-CM

## 2019-10-31 DIAGNOSIS — Z1231 Encounter for screening mammogram for malignant neoplasm of breast: Secondary | ICD-10-CM | POA: Diagnosis not present

## 2019-11-01 ENCOUNTER — Other Ambulatory Visit (HOSPITAL_COMMUNITY)
Admission: RE | Admit: 2019-11-01 | Discharge: 2019-11-01 | Disposition: A | Discharge status: Home / Self Care | Payer: Medicare PPO | Source: Ambulatory Visit | Attending: Emergency Medicine | Admitting: Emergency Medicine

## 2019-11-01 ENCOUNTER — Encounter (HOSPITAL_COMMUNITY): Payer: Self-pay | Admitting: Emergency Medicine

## 2019-11-01 DIAGNOSIS — Z20822 Contact with and (suspected) exposure to covid-19: Secondary | ICD-10-CM | POA: Diagnosis not present

## 2019-11-01 DIAGNOSIS — Z01812 Encounter for preprocedural laboratory examination: Secondary | ICD-10-CM | POA: Insufficient documentation

## 2019-11-01 LAB — SARS CORONAVIRUS 2 (TAT 6-24 HRS): SARS Coronavirus 2: NEGATIVE

## 2019-11-01 NOTE — Progress Notes (Signed)
Patient denies chest pain, shortness of breath, or cardiology visit. Patient states she had a stress test in [place] about 3- 5 years ago. Reports at the time she was not having any cardiac issues. Educated on Environmental manager. Reminded about need for quarantine until after procedure.

## 2019-11-04 ENCOUNTER — Encounter (HOSPITAL_COMMUNITY)
Admission: RE | Disposition: A | Discharge status: Home / Self Care | Payer: Self-pay | Source: Home / Self Care | Attending: Emergency Medicine

## 2019-11-04 ENCOUNTER — Ambulatory Visit (HOSPITAL_COMMUNITY)
Admission: RE | Admit: 2019-11-04 | Discharge: 2019-11-04 | Disposition: A | Discharge status: Home / Self Care | Payer: Medicare PPO | Attending: Emergency Medicine | Admitting: Emergency Medicine

## 2019-11-04 ENCOUNTER — Ambulatory Visit (HOSPITAL_COMMUNITY): Payer: Medicare PPO

## 2019-11-04 ENCOUNTER — Encounter (HOSPITAL_COMMUNITY): Payer: Self-pay | Admitting: Emergency Medicine

## 2019-11-04 ENCOUNTER — Ambulatory Visit (HOSPITAL_COMMUNITY): Payer: Medicare PPO | Admitting: Anesthesiology

## 2019-11-04 DIAGNOSIS — E039 Hypothyroidism, unspecified: Secondary | ICD-10-CM | POA: Diagnosis not present

## 2019-11-04 DIAGNOSIS — R918 Other nonspecific abnormal finding of lung field: Secondary | ICD-10-CM | POA: Diagnosis not present

## 2019-11-04 DIAGNOSIS — Z9889 Other specified postprocedural states: Secondary | ICD-10-CM

## 2019-11-04 DIAGNOSIS — R911 Solitary pulmonary nodule: Secondary | ICD-10-CM | POA: Diagnosis not present

## 2019-11-04 DIAGNOSIS — J9811 Atelectasis: Secondary | ICD-10-CM | POA: Diagnosis not present

## 2019-11-04 HISTORY — PX: BRONCHIAL NEEDLE ASPIRATION BIOPSY: SHX5106

## 2019-11-04 HISTORY — PX: VIDEO BRONCHOSCOPY WITH ENDOBRONCHIAL NAVIGATION: SHX6175

## 2019-11-04 HISTORY — PX: BRONCHIAL BIOPSY: SHX5109

## 2019-11-04 HISTORY — PX: BRONCHIAL BRUSHINGS: SHX5108

## 2019-11-04 HISTORY — PX: BRONCHIAL WASHINGS: SHX5105

## 2019-11-04 LAB — CBC
HCT: 44.9 % (ref 36.0–46.0)
Hemoglobin: 14.3 g/dL (ref 12.0–15.0)
MCH: 29.9 pg (ref 26.0–34.0)
MCHC: 31.8 g/dL (ref 30.0–36.0)
MCV: 93.9 fL (ref 80.0–100.0)
Platelets: 333 10*3/uL (ref 150–400)
RBC: 4.78 MIL/uL (ref 3.87–5.11)
RDW: 13.4 % (ref 11.5–15.5)
WBC: 6.9 10*3/uL (ref 4.0–10.5)
nRBC: 0 % (ref 0.0–0.2)

## 2019-11-04 LAB — COMPREHENSIVE METABOLIC PANEL
ALT: 42 U/L (ref 0–44)
AST: 45 U/L — ABNORMAL HIGH (ref 15–41)
Albumin: 4 g/dL (ref 3.5–5.0)
Alkaline Phosphatase: 98 U/L (ref 38–126)
Anion gap: 8 (ref 5–15)
BUN: 20 mg/dL (ref 8–23)
CO2: 27 mmol/L (ref 22–32)
Calcium: 9.4 mg/dL (ref 8.9–10.3)
Chloride: 106 mmol/L (ref 98–111)
Creatinine, Ser: 0.83 mg/dL (ref 0.44–1.00)
GFR calc Af Amer: >60 mL/min (ref >60)
GFR calc non Af Amer: >60 mL/min (ref >60)
Glucose, Bld: 99 mg/dL (ref 70–99)
Potassium: 3.9 mmol/L (ref 3.5–5.1)
Sodium: 141 mmol/L (ref 135–145)
Total Bilirubin: 0.5 mg/dL (ref 0.3–1.2)
Total Protein: 6.8 g/dL (ref 6.5–8.1)

## 2019-11-04 LAB — APTT: aPTT: 30 seconds (ref 24–36)

## 2019-11-04 LAB — PROTIME-INR
INR: 1 (ref 0.8–1.2)
Prothrombin Time: 13.1 seconds (ref 11.4–15.2)

## 2019-11-04 SURGERY — VIDEO BRONCHOSCOPY WITH ENDOBRONCHIAL NAVIGATION
Anesthesia: General

## 2019-11-04 MED ORDER — LACTATED RINGERS IV SOLN: INTRAVENOUS | Status: DC | PRN

## 2019-11-04 MED ORDER — LIDOCAINE 2% (20 MG/ML) 5 ML SYRINGE
INTRAMUSCULAR | Status: DC | PRN
  Administered 2019-11-04: 40 mg via INTRAVENOUS

## 2019-11-04 MED ORDER — ONDANSETRON HCL 4 MG/2ML IJ SOLN: 4.0000 mg | Freq: Once | INTRAMUSCULAR | Status: DC | PRN

## 2019-11-04 MED ORDER — ONDANSETRON HCL 4 MG/2ML IJ SOLN
INTRAMUSCULAR | Status: DC | PRN
  Administered 2019-11-04: 4 mg via INTRAVENOUS

## 2019-11-04 MED ORDER — ROCURONIUM BROMIDE 10 MG/ML (PF) SYRINGE
PREFILLED_SYRINGE | INTRAVENOUS | Status: DC | PRN
  Administered 2019-11-04: 60 mg via INTRAVENOUS

## 2019-11-04 MED ORDER — FENTANYL CITRATE (PF) 100 MCG/2ML IJ SOLN: 25.0000 ug | INTRAMUSCULAR | Status: DC | PRN

## 2019-11-04 MED ORDER — SUGAMMADEX SODIUM 200 MG/2ML IV SOLN
INTRAVENOUS | Status: DC | PRN
  Administered 2019-11-04: 200 mg via INTRAVENOUS

## 2019-11-04 MED ORDER — CHLORHEXIDINE GLUCONATE 0.12 % MT SOLN
OROMUCOSAL | Status: AC
  Administered 2019-11-04: 15 mL
  Filled 2019-11-04: qty 15

## 2019-11-04 MED ORDER — PROPOFOL 10 MG/ML IV BOLUS
INTRAVENOUS | Status: DC | PRN
  Administered 2019-11-04: 160 mg via INTRAVENOUS

## 2019-11-04 MED ORDER — FENTANYL CITRATE (PF) 250 MCG/5ML IJ SOLN
INTRAMUSCULAR | Status: DC | PRN
  Administered 2019-11-04: 50 ug via INTRAVENOUS

## 2019-11-04 MED ORDER — EPHEDRINE SULFATE 50 MG/ML IJ SOLN
INTRAMUSCULAR | Status: DC | PRN
  Administered 2019-11-04: 5 mg via INTRAVENOUS

## 2019-11-04 MED ORDER — DEXAMETHASONE SODIUM PHOSPHATE 10 MG/ML IJ SOLN
INTRAMUSCULAR | Status: DC | PRN
  Administered 2019-11-04: 5 mg via INTRAVENOUS

## 2019-11-04 NOTE — Op Note (Signed)
Video Bronchoscopy with Electromagnetic Navigation Procedure Note  Date of Operation: 11/04/2019  Pre-op Diagnosis: Right middle lobe nodule, right upper lobe nodule  Post-op Diagnosis: Same  Surgeon: Baltazar Apo  Assistants: None  Anesthesia: General endotracheal anesthesia  Operation: Flexible video fiberoptic bronchoscopy with electromagnetic navigation and biopsies.  Estimated Blood Loss: Minimal  Complications: None apparent  Indications and History: Cynthia Rhodes is a 74 y.o. female, never smoker, with hypothyroidism and allergies on Xolair.  Found to have 3.3 lobulated anterior right middle lobe mass on virtual colonoscopy 10/02/2019.  CT scan and PET scan performed, showed hypermetabolism.  The right upper lobe nodules too small to be fully evaluated by PET.  The risks, benefits, complications, treatment options and expected outcomes were discussed with the patient.  The possibilities of pneumothorax, pneumonia, reaction to medication, pulmonary aspiration, perforation of a viscus, bleeding, failure to diagnose a condition and creating a complication requiring transfusion or operation were discussed with the patient who freely signed the consent.    Description of Procedure: The patient was seen in the Preoperative Area, was examined and was deemed appropriate to proceed.  The patient was taken to [facility] endoscopy room 2, identified as Cynthia Rhodes and the procedure verified as Flexible Video Fiberoptic Bronchoscopy.  A Time Out was held and the above information confirmed.   Prior to the date of the procedure a high-resolution CT scan of the chest was performed. Utilizing [place] a virtual tracheobronchial tree was generated to allow the creation of distinct navigation pathways to the patient's parenchymal abnormalities. After being taken to the operating room general anesthesia was initiated and the patient  was orally intubated. The video fiberoptic  bronchoscope was introduced via the endotracheal tube and a general inspection was performed which showed normal airways throughout.  There were no endobronchial lesions or abnormal secretions seen. The extendable working channel and locator guide were introduced into the bronchoscope. The distinct navigation pathways prepared prior to this procedure were then utilized to navigate to within 0.5-0.8 cm of the center of patient's right middle lobe mass identified on CT scan. The extendable working channel was secured into place and the locator guide was withdrawn. Under fluoroscopic guidance transbronchial needle brushings, transbronchial Wang needle biopsies, and transbronchial forceps biopsies were performed to be sent for cytology and pathology. A bronchioalveolar lavage was performed in the right middle lobe and sent for cytology and microbiology (bacterial, fungal, AFB smears and cultures). At the end of the procedure a general airway inspection was performed and there was no evidence of active bleeding. The bronchoscope was removed.  The patient tolerated the procedure well. There was no significant blood loss and there were no obvious complications. A post-procedural chest x-ray is pending.  Samples: 1. Transbronchial needle brushings from right middle lobe mass 2. Transbronchial Wang needle biopsies from right middle lobe mass 3. Transbronchial forceps biopsies from right middle lobe mass 4. Bronchoalveolar lavage from right middle lobe  Plans:  The patient will be discharged from the PACU to home when recovered from anesthesia and after chest x-ray is reviewed. We will review the cytology, pathology and microbiology results with the patient when they become available. Outpatient followup will be with Dr Lamonte Sakai.    Baltazar Apo, MD, PhD 11/04/2019, 9:07 AM [place] [facility] [phone] or if no answer [phone]

## 2019-11-04 NOTE — Anesthesia Postprocedure Evaluation (Signed)
Anesthesia Post Note  Patient: Cynthia Rhodes  Procedure(s) Performed: VIDEO BRONCHOSCOPY WITH ENDOBRONCHIAL NAVIGATION (N/A ) BRONCHIAL BRUSHINGS BRONCHIAL NEEDLE ASPIRATION BIOPSIES BRONCHIAL BIOPSIES BRONCHIAL WASHINGS     Patient location during evaluation: PACU Anesthesia Type: General Level of consciousness: awake and alert Pain management: pain level controlled Vital Signs Assessment: post-procedure vital signs reviewed and stable Respiratory status: spontaneous breathing, nonlabored ventilation and respiratory function stable Cardiovascular status: blood pressure returned to baseline and stable Postop Assessment: no apparent nausea or vomiting Anesthetic complications: no   No complications documented.  Last Vitals:  Vitals:   11/04/19 1021 11/04/19 1022  BP: (!) 144/61   Pulse: 68 64  Resp: 16 19  Temp: 36.6 C   SpO2: 93% 95%    Last Pain:  Vitals:   11/04/19 1021  TempSrc:   PainSc: 0-No pain                 Lidia Collum

## 2019-11-04 NOTE — Anesthesia Procedure Notes (Signed)
Procedure Name: Intubation Date/Time: 11/04/2019 7:36 AM Performed by: Alain Marion, CRNA Pre-anesthesia Checklist: Patient identified, Emergency Drugs available, Suction available and Patient being monitored Patient Re-evaluated:Patient Re-evaluated prior to induction Oxygen Delivery Method: Circle System Utilized Preoxygenation: Pre-oxygenation with 100% oxygen Induction Type: IV induction Ventilation: Mask ventilation without difficulty Laryngoscope Size: Mac and 3 Grade View: Grade I Tube type: Oral Tube size: 8.5 mm Number of attempts: 1 Airway Equipment and Method: Stylet Placement Confirmation: ETT inserted through vocal cords under direct vision,  positive ETCO2 and breath sounds checked- equal and bilateral Secured at: 20 cm Tube secured with: Tape Dental Injury: Teeth and Oropharynx as per pre-operative assessment  Comments: Performed by Reece Agar, SRNA under supervision of CRNA and MDA

## 2019-11-04 NOTE — Anesthesia Preprocedure Evaluation (Signed)
Anesthesia Evaluation  Patient identified by MRN, date of birth, ID band Patient awake    Reviewed: Allergy & Precautions, NPO status , Patient's Chart, lab work & pertinent test results  Airway Mallampati: II  TM Distance: >3 FB Neck ROM: Full    Dental  (+) Teeth Intact, Dental Advisory Given   Pulmonary    breath sounds clear to auscultation       Cardiovascular  Rhythm:Regular Rate:Normal     Neuro/Psych    GI/Hepatic   Endo/Other    Renal/GU      Musculoskeletal   Abdominal   Peds  Hematology   Anesthesia Other Findings   Reproductive/Obstetrics                             Anesthesia Physical Anesthesia Plan  ASA: II  Anesthesia Plan: General   Post-op Pain Management:    Induction: Intravenous  PONV Risk Score and Plan: Ondansetron and Dexamethasone  Airway Management Planned: Oral ETT  Additional Equipment:   Intra-op Plan:   Post-operative Plan: Extubation in OR  Informed Consent: I have reviewed the patients History and Physical, chart, labs and discussed the procedure including the risks, benefits and alternatives for the proposed anesthesia with the patient or authorized representative who has indicated his/her understanding and acceptance.     Dental advisory given  Plan Discussed with: CRNA and Anesthesiologist  Anesthesia Plan Comments:         Anesthesia Quick Evaluation

## 2019-11-04 NOTE — Interval H&P Note (Signed)
History and Physical Interval Note:  11/04/2019 7:26 AM  Donah Driver  has presented today for surgery, with the diagnosis of LUNG NODULE.  The various methods of treatment have been discussed with the patient and family. After consideration of risks, benefits and other options for treatment, the patient has consented to  Procedure(s): [place] (N/A) as a surgical intervention.  The patient's history has been reviewed, patient examined, no change in status, stable for surgery.  I have reviewed the patient's chart and labs.  Questions were answered to the patient's satisfaction.     Collene Gobble

## 2019-11-04 NOTE — Transfer of Care (Signed)
Immediate Anesthesia Transfer of Care Note  Patient: Anneliese E Haze  Procedure(s) Performed: VIDEO BRONCHOSCOPY WITH ENDOBRONCHIAL NAVIGATION (N/A ) BRONCHIAL BRUSHINGS BRONCHIAL NEEDLE ASPIRATION BIOPSIES BRONCHIAL BIOPSIES BRONCHIAL WASHINGS  Patient Location: PACU  Anesthesia Type:General  Level of Consciousness: awake and alert   Airway & Oxygen Therapy: Patient Spontanous Breathing and Patient connected to face mask oxygen  Post-op Assessment: Report given to RN, Post -op Vital signs reviewed and stable and Patient moving all extremities  Post vital signs: Reviewed and stable  Last Vitals:  Vitals Value Taken Time  BP 166/74 11/04/19 0915  Temp    Pulse 65 11/04/19 0916  Resp 16 11/04/19 0916  SpO2 100 % 11/04/19 0916  Vitals shown include unvalidated device data.  Last Pain:  Vitals:   11/04/19 0629  TempSrc: Oral  PainSc: 0-No pain         Complications: No complications documented.

## 2019-11-04 NOTE — Discharge Instructions (Signed)
Flexible Bronchoscopy, Care After This sheet gives you information about how to care for yourself after your test. Your doctor may also give you more specific instructions. If you have problems or questions, contact your doctor. Follow these instructions at home: Eating and drinking  Do not eat or drink anything (not even water) for 2 hours after your test, or until your numbing medicine (local anesthetic) wears off.  When your numbness is gone and your cough and gag reflexes have come back, you may: ? Eat only soft foods. ? Slowly drink liquids.  The day after the test, go back to your normal diet. Driving  Do not drive for 24 hours if you were given a medicine to help you relax (sedative).  Do not drive or use heavy machinery while taking prescription pain medicine. General instructions   Take over-the-counter and prescription medicines only as told by your doctor.  Return to your normal activities as told. Ask what activities are safe for you.  Do not use any products that have nicotine or tobacco in them. This includes cigarettes and e-cigarettes. If you need help quitting, ask your doctor.  Keep all follow-up visits as told by your doctor. This is important. It is very important if you had a tissue sample (biopsy) taken. Get help right away if:  You have shortness of breath that gets worse.  You get light-headed.  You feel like you are going to pass out (faint).  You have chest pain.  You cough up: ? More than a little blood. ? More blood than before. Summary  Do not eat or drink anything (not even water) for 2 hours after your test, or until your numbing medicine wears off.  Do not use cigarettes. Do not use e-cigarettes.  Get help right away if you have chest pain.  Please call our office for any questions or concerns.  [phone].  This information is not intended to replace advice given to you by your health care provider. Make sure you discuss any  questions you have with your health care provider. Document Revised: 04/20/2017 Document Reviewed: 05/26/2016 Elsevier Patient Education  2020 [employer].

## 2019-11-05 ENCOUNTER — Encounter (HOSPITAL_COMMUNITY): Payer: Self-pay | Admitting: Emergency Medicine

## 2019-11-05 LAB — ACID FAST SMEAR (AFB, MYCOBACTERIA): Acid Fast Smear: NEGATIVE

## 2019-11-05 LAB — CYTOLOGY - NON PAP

## 2019-11-05 LAB — SURGICAL PATHOLOGY

## 2019-11-06 LAB — CULTURE, RESPIRATORY W GRAM STAIN
Culture: NO GROWTH
Gram Stain: NONE SEEN

## 2019-11-10 ENCOUNTER — Encounter: Payer: Self-pay | Admitting: Emergency Medicine

## 2019-11-10 ENCOUNTER — Ambulatory Visit (INDEPENDENT_AMBULATORY_CARE_PROVIDER_SITE_OTHER): Payer: Medicare PPO | Admitting: Emergency Medicine

## 2019-11-10 ENCOUNTER — Other Ambulatory Visit: Payer: Self-pay

## 2019-11-10 ENCOUNTER — Encounter (HOSPITAL_COMMUNITY): Payer: Self-pay | Admitting: Radiology

## 2019-11-10 VITALS — BP 122/70 | HR 72 | Temp 97.8°F | Ht 66.0 in | Wt 155.0 lb

## 2019-11-10 DIAGNOSIS — R918 Other nonspecific abnormal finding of lung field: Secondary | ICD-10-CM

## 2019-11-10 NOTE — Patient Instructions (Addendum)
Perform pulmonary function testing.  We will schedule for you.  We will arrange for a needle biopsy of your right middle lobe pulmonary nodule in interventional radiology.  Follow with Dr. Lamonte Sakai next available with full pulmonary function testing on the same day.

## 2019-11-10 NOTE — Progress Notes (Signed)
   Subjective:    Patient ID: Cynthia Rhodes, female    DOB: 1946/01/17, 74 y.o.   MRN: [id number]  HPI 74 year old never smoker with a history of hypothyroidism and allergic disease on Xolair, scheduled to have another injection in June, being managed Dr Curly Rim.  She is referred today for evaluation of a 3.3 lobulated anterior right middle lobe mass that was originally seen on virtual colonoscopy 10/02/2019.  This prompted a CT chest done on 10/06/2019 which I have reviewed, shows a 2.8 x 1.6 spiculated right middle lobe mass concerning for primary malignancy.  Also noted was a 7 x 5 mm nodule in the right upper lobe.  A PET scan has been ordered and is planned for 5/27.   ROV 11/10/19 --follow-up visit further evaluation of pulmonary nodular disease.  We performed bronchoscopy to better evaluate a 2.8 x 1.6 spiculated right middle lobe mass: No malignant cells were seen on cytology or pathology.   She feels well post-procedure.   Review of Systems As per HPI   Past Medical History:  Diagnosis Date  . Hypothyroidism      Family History  Problem Relation Age of Onset  . Asthma Mother   . Breast cancer Mother     Has lived in [place], [place], [place] Worked as a rad Designer, multimedia - wore protective lead No inhaled exposure.        Objective:   Physical Exam Vitals:   11/10/19 0930  BP: 122/70  Pulse: 72  Temp: 97.8 F (36.6 C)  TempSrc: Oral  SpO2: 97%  Weight: 155 lb (70.3 kg)  Height: 5\' 6"  (1.676 m)   Gen: Pleasant, well-nourished, in no distress,  normal affect  ENT: No lesions,  mouth clear,  oropharynx clear, no postnasal drip  Neck: No JVD, no stridor  Lungs: No use of accessory muscles, no crackles or wheezing on normal respiration, no wheeze on forced expiration  Cardiovascular: RRR, heart sounds normal, no murmur or gallops, no peripheral edema  Musculoskeletal: No deformities, no cyanosis or clubbing  Neuro: alert, awake, non focal  Skin: Warm, no lesions or rash    We  spent 25 minutes discussing the bx results, options for dx and next steps.      Assessment & Plan:  Pulmonary nodules Good samples of right middle lobe nodule on ENB but cytology and pathology negative.  We discussed the pros and cons of repeating her CT scan in 3 months, then plan another procedure if needed.  Also discussed possibly pursuing alternative tissue diagnosis now.  She favors getting a procedure now if feasible.  I will consult interventional radiology for transthoracic needle biopsy.  I will also perform pulmonary function testing in the event that resection is a possibility going forward.  I have not sent her for primary resection because she has a smaller right upper lobe nodule that has not been well characterized.  She will need repeat scanning for this.  Baltazar Apo, MD, PhD 11/10/2019, 10:13 AM [facility] [phone] or if no answer [phone]

## 2019-11-10 NOTE — Assessment & Plan Note (Signed)
Good samples of right middle lobe nodule on ENB but cytology and pathology negative.  We discussed the pros and cons of repeating Cynthia Rhodes CT scan in 3 months, then plan another procedure if needed.  Also discussed possibly pursuing alternative tissue diagnosis now.  She favors getting a procedure now if feasible.  I will consult interventional radiology for transthoracic needle biopsy.  I will also perform pulmonary function testing in the event that resection is a possibility going forward.  I have not sent Cynthia Rhodes for primary resection because she has a smaller right upper lobe nodule that has not been well characterized.  She will need repeat scanning for this.

## 2019-11-10 NOTE — Progress Notes (Signed)
Cynthia Rhodes "[name]" Female, 74 y.o., [birth year] MRN:  [id number] Phone:  [phone] Jerilynn Mages) PCP:  [name], DO Coverage:  [facility] Medicare/[employer] Medicare Choice Ppo Next Appt With Radiology (GI-BCG DX DEXA 1) 01/08/2020 at 2:00 PM  RE: CT Biopsy Received: Today Arne Cleveland, MD  Jillyn Hidden [place]   CT core RML lesion  Bronch neg   DDH       Previous Messages   ----- Message -----  From: Garth Bigness D  Sent: 11/10/2019 12:16 PM EDT  To: [facility] Procedure Requests  Subject: CT Biopsy                     Procedure:  CT Biopsy   Reason: Pulmonary nodules, right middle lobe mass   History: CT, NM, [place] in computer   Provider: Collene Gobble   Provider Contact: [phone]

## 2019-11-17 ENCOUNTER — Ambulatory Visit (HOSPITAL_COMMUNITY)
Admission: RE | Admit: 2019-11-17 | Discharge: 2019-11-17 | Disposition: A | Discharge status: Home / Self Care | Payer: Medicare PPO | Source: Ambulatory Visit | Attending: Emergency Medicine | Admitting: Emergency Medicine

## 2019-11-17 DIAGNOSIS — Z01812 Encounter for preprocedural laboratory examination: Secondary | ICD-10-CM | POA: Insufficient documentation

## 2019-11-17 DIAGNOSIS — Z20822 Contact with and (suspected) exposure to covid-19: Secondary | ICD-10-CM | POA: Insufficient documentation

## 2019-11-17 LAB — SARS CORONAVIRUS 2 (TAT 6-24 HRS): SARS Coronavirus 2: NEGATIVE

## 2019-11-18 ENCOUNTER — Other Ambulatory Visit: Payer: Self-pay | Admitting: Radiology

## 2019-11-19 ENCOUNTER — Ambulatory Visit (HOSPITAL_COMMUNITY)
Admission: RE | Admit: 2019-11-19 | Discharge: 2019-11-19 | Disposition: A | Discharge status: Home / Self Care | Payer: Medicare PPO | Source: Ambulatory Visit | Attending: Emergency Medicine | Admitting: Emergency Medicine

## 2019-11-19 ENCOUNTER — Encounter (HOSPITAL_COMMUNITY): Payer: Self-pay

## 2019-11-19 ENCOUNTER — Other Ambulatory Visit: Payer: Self-pay

## 2019-11-19 ENCOUNTER — Ambulatory Visit (HOSPITAL_COMMUNITY)
Admission: RE | Admit: 2019-11-19 | Discharge: 2019-11-19 | Disposition: A | Discharge status: Home / Self Care | Payer: Medicare PPO | Source: Ambulatory Visit | Attending: Interventional Radiology | Admitting: Interventional Radiology

## 2019-11-19 DIAGNOSIS — R918 Other nonspecific abnormal finding of lung field: Secondary | ICD-10-CM | POA: Insufficient documentation

## 2019-11-19 DIAGNOSIS — Z79899 Other long term (current) drug therapy: Secondary | ICD-10-CM | POA: Insufficient documentation

## 2019-11-19 DIAGNOSIS — D3A8 Other benign neuroendocrine tumors: Secondary | ICD-10-CM | POA: Diagnosis not present

## 2019-11-19 DIAGNOSIS — Z885 Allergy status to narcotic agent status: Secondary | ICD-10-CM | POA: Insufficient documentation

## 2019-11-19 DIAGNOSIS — E039 Hypothyroidism, unspecified: Secondary | ICD-10-CM | POA: Diagnosis not present

## 2019-11-19 DIAGNOSIS — Z882 Allergy status to sulfonamides status: Secondary | ICD-10-CM | POA: Diagnosis not present

## 2019-11-19 DIAGNOSIS — C7A8 Other malignant neuroendocrine tumors: Secondary | ICD-10-CM | POA: Diagnosis not present

## 2019-11-19 DIAGNOSIS — Z96653 Presence of artificial knee joint, bilateral: Secondary | ICD-10-CM | POA: Insufficient documentation

## 2019-11-19 DIAGNOSIS — Z791 Long term (current) use of non-steroidal anti-inflammatories (NSAID): Secondary | ICD-10-CM | POA: Insufficient documentation

## 2019-11-19 DIAGNOSIS — Z9889 Other specified postprocedural states: Secondary | ICD-10-CM | POA: Diagnosis not present

## 2019-11-19 DIAGNOSIS — R911 Solitary pulmonary nodule: Secondary | ICD-10-CM | POA: Diagnosis not present

## 2019-11-19 LAB — CBC
HCT: 44.5 % (ref 36.0–46.0)
Hemoglobin: 13.9 g/dL (ref 12.0–15.0)
MCH: 29.7 pg (ref 26.0–34.0)
MCHC: 31.2 g/dL (ref 30.0–36.0)
MCV: 95.1 fL (ref 80.0–100.0)
Platelets: 316 10*3/uL (ref 150–400)
RBC: 4.68 MIL/uL (ref 3.87–5.11)
RDW: 13.6 % (ref 11.5–15.5)
WBC: 7.8 10*3/uL (ref 4.0–10.5)
nRBC: 0 % (ref 0.0–0.2)

## 2019-11-19 LAB — PROTIME-INR
INR: 1 (ref 0.8–1.2)
Prothrombin Time: 12.7 seconds (ref 11.4–15.2)

## 2019-11-19 MED ORDER — FENTANYL CITRATE (PF) 100 MCG/2ML IJ SOLN
INTRAMUSCULAR | Status: AC | PRN
  Administered 2019-11-19: 50 ug via INTRAVENOUS
  Administered 2019-11-19 (×2): 25 ug via INTRAVENOUS

## 2019-11-19 MED ORDER — FENTANYL CITRATE (PF) 100 MCG/2ML IJ SOLN
INTRAMUSCULAR | Status: AC
  Filled 2019-11-19: qty 2

## 2019-11-19 MED ORDER — MIDAZOLAM HCL 2 MG/2ML IJ SOLN
INTRAMUSCULAR | Status: AC | PRN
  Administered 2019-11-19 (×2): 0.5 mg via INTRAVENOUS

## 2019-11-19 MED ORDER — MIDAZOLAM HCL 2 MG/2ML IJ SOLN
INTRAMUSCULAR | Status: AC
  Filled 2019-11-19: qty 4

## 2019-11-19 MED ORDER — LIDOCAINE-EPINEPHRINE 1 %-1:100000 IJ SOLN
INTRAMUSCULAR | Status: AC
  Filled 2019-11-19: qty 1

## 2019-11-19 MED ORDER — SODIUM CHLORIDE 0.9 % IV SOLN: INTRAVENOUS | Status: DC

## 2019-11-19 NOTE — H&P (Signed)
Chief Complaint: Patient was seen in consultation today for right lung mass biopsy at the request of Byrum,Robert S  Referring Physician(s): Baltazar Apo S  Supervising Physician: Sandi Mariscal  Patient Status: [facility] - Out-pt  History of Present Illness: Cynthia Rhodes is a 74 y.o. female   Hx never smoker Hypothyroid  Incidental finding of RML nodule during routine virtual colonoscopy CT chest done on 10/06/2019 which I have reviewed, shows a 2.8 x 1.6 spiculated right middle lobe mass concerning for primary malignancy.  Bronchoscopy 11/04/19: A. LUNG, RIGHT MIDDLE LOBE MASS, BIOPSY:  - Lung tissue; no malignancy identified.   Now scheduled for needle biopsy of RML lesion  Denies pain; wt loss  Endorsed dry cough for years  Past Medical History:  Diagnosis Date   Hypothyroidism     Past Surgical History:  Procedure Laterality Date   ABDOMINAL HYSTERECTOMY     BRONCHIAL BIOPSY  11/04/2019   Procedure: BRONCHIAL BIOPSIES;  Surgeon: Collene Gobble, MD;  Location: [facility] ENDOSCOPY;  Service: Pulmonary;;   BRONCHIAL BRUSHINGS  11/04/2019   Procedure: BRONCHIAL BRUSHINGS;  Surgeon: Collene Gobble, MD;  Location: [facility] ENDOSCOPY;  Service: Pulmonary;;   BRONCHIAL NEEDLE ASPIRATION BIOPSY  11/04/2019   Procedure: BRONCHIAL NEEDLE ASPIRATION BIOPSIES;  Surgeon: Collene Gobble, MD;  Location: [facility];  Service: Pulmonary;;   BRONCHIAL WASHINGS  11/04/2019   Procedure: BRONCHIAL WASHINGS;  Surgeon: Collene Gobble, MD;  Location: [facility] ENDOSCOPY;  Service: Pulmonary;;   BUNIONECTOMY Bilateral    CHOLECYSTECTOMY     KNEE SURGERY Bilateral    REPLACEMENT TOTAL KNEE BILATERAL     SIGMOIDECTOMY     TONSILLECTOMY     VIDEO BRONCHOSCOPY WITH ENDOBRONCHIAL NAVIGATION N/A 11/04/2019   Procedure: VIDEO BRONCHOSCOPY WITH ENDOBRONCHIAL NAVIGATION;  Surgeon: Collene Gobble, MD;  Location: [facility] ENDOSCOPY;  Service: Pulmonary;  Laterality: N/A;    Allergies: Codeine, Drug [tape],  and Sulfa antibiotics  Medications: Prior to Admission medications   Medication Sig Start Date End Date Taking? Authorizing Provider  Calcium Carb-Cholecalciferol (CALCIUM + D3 PO) Take by mouth daily.   Yes [provider]  levothyroxine (SYNTHROID) 50 MCG tablet Take 50 mcg by mouth every other day. Alternating with 75 mcg   Yes [provider]  levothyroxine (SYNTHROID) 75 MCG tablet Take 75 mcg by mouth every other day. Alternating with 50 mcg 09/29/19  Yes [provider]  magnesium oxide (MAG-OX) 400 MG tablet Take 400 mg by mouth daily.   Yes [provider]  meloxicam (MOBIC) 15 MG tablet Take 1 tablet (15 mg total) by mouth daily. 10/22/19  Yes Edrick Kins, DPM  Multiple Vitamins-Minerals (HAIR/SKIN/NAILS/BIOTIN PO) Take 1 tablet by mouth daily.   Yes [provider]  Multiple Vitamins-Minerals (OCUVITE EYE HEALTH FORMULA PO) Take 1 tablet by mouth daily.   Yes [provider]  OVER THE COUNTER MEDICATION Take 3 capsules by mouth daily. Balance of Nature   Yes [provider]  Propylene Glycol (SYSTANE BALANCE) 0.6 % SOLN Place 1 drop into both eyes daily.   Yes [provider]  TURMERIC PO Take 1,000 mg by mouth daily.    Yes [provider]     Family History  Problem Relation Age of Onset   Asthma Mother    Breast cancer Mother     Social History   Socioeconomic History   Marital status: Widowed    Spouse name: Not on file   Number of children: Not on  file   Years of education: Not on file   Highest education level: Not on file  Occupational History   Not on file  Tobacco Use   Smoking status: Never Smoker   Smokeless tobacco: Never Used  Vaping Use   Vaping Use: Never used  Substance and Sexual Activity   Alcohol use: Yes    Alcohol/week: 14.0 standard drinks    Types: 14 Glasses of wine per week   Drug use: Never   Sexual activity: Not on file  Other Topics Concern     Not on file  Social History Narrative   Not on file   Social Determinants of Health   Financial Resource Strain:    Difficulty of Paying Living Expenses:   Food Insecurity:    Worried About Charity fundraiser in the Last Year:    Arboriculturist in the Last Year:   Transportation Needs:    Film/video editor (Medical):    Lack of Transportation (Non-Medical):   Physical Activity:    Days of Exercise per Week:    Minutes of Exercise per Session:   Stress:    Feeling of Stress :   Social Connections:    Frequency of Communication with Friends and Family:    Frequency of Social Gatherings with Friends and Family:    Attends Religious Services:    Active Member of Clubs or Organizations:    Attends Archivist Meetings:    Marital Status:      Review of Systems: A 12 point ROS discussed and pertinent positives are indicated in the HPI above.  All other systems are negative.  Review of Systems  Constitutional: Negative for activity change, fatigue and unexpected weight change.  HENT: Negative for sore throat.   Respiratory: Positive for cough. Negative for shortness of breath.   Cardiovascular: Negative for chest pain.  Gastrointestinal: Negative for abdominal pain.  Neurological: Negative for weakness.  Psychiatric/Behavioral: Negative for behavioral problems and confusion.    Vital Signs: BP (!) 162/86    Pulse 65    Temp 97.6 F (36.4 C) (Oral)    Resp 14    Ht 5\' 6"  (1.676 m)    Wt 155 lb (70.3 kg)    SpO2 98%    BMI 25.02 kg/m   Physical Exam Vitals reviewed.  Cardiovascular:     Rate and Rhythm: Normal rate and regular rhythm.     Heart sounds: Normal heart sounds.  Pulmonary:     Effort: Pulmonary effort is normal.     Breath sounds: Normal breath sounds.  Abdominal:     Palpations: Abdomen is soft.  Musculoskeletal:        General: Normal range of motion.  Skin:    General: Skin is warm.  Neurological:     Mental Status:  She is alert and oriented to person, place, and time.  Psychiatric:        Behavior: Behavior normal.     Imaging: DG Chest Port 1 View  Result Date: 11/04/2019 CLINICAL DATA:  Status post bronchoscopy EXAM: PORTABLE CHEST 1 VIEW COMPARISON:  Chest CT Oct 06, 2019; PET-CT Oct 16, 2019 FINDINGS: No pneumothorax. The known mass in the right middle lobe is evident with mild surrounding atelectasis. Lungs elsewhere are clear. Heart size and pulmonary vascular normal. No adenopathy. No bone lesions. IMPRESSION: Known right middle lobe mass evident with mild surrounding atelectasis. Lungs elsewhere clear. No pneumothorax. Cardiac silhouette normal. No adenopathy appreciable by  radiography. Electronically Signed   By: Lowella Grip III M.D.   On: 11/04/2019 09:28   US THYROID  Result Date: 10/29/2019 CLINICAL DATA:  Right neck nodule on recent PET-CT, without hypermetabolic activity. EXAM: THYROID ULTRASOUND TECHNIQUE: Ultrasound examination of the thyroid gland and adjacent soft tissues was performed. COMPARISON:  PET-CT 10/16/2019 FINDINGS: Parenchymal Echotexture: Moderately heterogenous Isthmus: 0.2 cm thickness Right lobe: 3 x 1 x 1.3 cm Left lobe: 3.4 x 0.9 x 0.7 cm _________________________________________________________ Estimated total number of nodules >/= 1 cm: 0 Number of spongiform nodules >/=  2 cm not described below (TR1): 0 Number of mixed cystic and solid nodules >/= 1.5 cm not described below (TR2): 0 _________________________________________________________ No discrete nodules are seen within the thyroid gland. Posterolateral to the right lobe of the thyroid is a 1.7 x 0.8 x 1.2 cm cystic lesion with internal echogenic foci, no significant color flow signal on color Doppler, no soft tissue component. IMPRESSION: 1. Small thyroid without nodule or other indication for biopsy or dedicated imaging follow-up. 2. 1.7 cm cystic lesion posterolateral to the right thyroid lobe corresponding to  lesion on PET-CT without hypermetabolic activity. 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 Correlate with any clinical or laboratory evidence of parathyroid pathology. The above is in keeping with the ACR TI-RADS recommendations - J Am Coll Radiol 2017;14:587-595. Electronically Signed   By: Lucrezia Europe M.D.   On: 10/29/2019 17:33   DG C-ARM BRONCHOSCOPY  Result Date: 11/04/2019 C-ARM BRONCHOSCOPY: Fluoroscopy was utilized by the requesting physician.  No radiographic interpretation.    Labs:  CBC: Recent Labs    11/04/19 0646 11/19/19 0935  WBC 6.9 7.8  HGB 14.3 13.9  HCT 44.9 44.5  PLT 333 316    COAGS: Recent Labs    11/04/19 0646  INR 1.0  APTT 30    BMP: Recent Labs    11/04/19 0646  NA 141  K 3.9  CL 106  CO2 27  GLUCOSE 99  BUN 20  CALCIUM 9.4  CREATININE 0.83  GFRNONAA >60  GFRAA >60    LIVER FUNCTION TESTS: Recent Labs    11/04/19 0646  BILITOT 0.5  AST 45*  ALT 42  ALKPHOS 98  PROT 6.8  ALBUMIN 4.0    TUMOR MARKERS: No results for input(s): AFPTM, CEA, CA199, CHROMGRNA in the last 8760 hours.  Assessment and Plan:  Right middle lobe lung nodule Incidental finding during routine virtual colonoscopy Bronch negative Scheduled now for needle biopsy Risks and benefits of CT guided lung nodule biopsy was discussed with the patient including, but not limited to bleeding, hemoptysis, respiratory failure requiring intubation, infection, pneumothorax requiring chest tube placement, stroke from air embolism or even death.  All of the patient's questions were answered and the patient is agreeable to proceed. Consent signed and in chart.   Thank you for this interesting consult.  I greatly enjoyed meeting ROSY ESTABROOK and look forward to participating in their care.  A copy of this report was sent to the requesting provider on this date.  Electronically Signed: Lavonia Drafts, PA-C 11/19/2019,  10:23 AM   I spent a total of  30 Minutes   in face to face in clinical consultation, greater than 50% of which was counseling/coordinating care for right lung nodule bx

## 2019-11-19 NOTE — Procedures (Signed)
Pre procedural Dx: Hypermetabolic right middle lobe nodule Post procedural Dx: Same  Technically successful CT guided biopsy of hypermetabolic right middle lobe pulmonary nodule.   EBL: None.  Complications: None immediate.   Ronny Bacon, MD Pager #: [phone]

## 2019-11-19 NOTE — Discharge Instructions (Signed)
Lung Biopsy, Care After This sheet gives you information about how to care for yourself after your procedure. Your health care provider may also give you more specific instructions depending on the type of biopsy you had. If you have problems or questions, contact your health care provider. What can I expect after the procedure? After the procedure, it is common to have:  A cough.  A sore throat.  Pain where a needle, bronchoscope, or incision was used to collect a biopsy sample (biopsy site). Follow these instructions at home: Medicines  Take over-the-counter and prescription medicines only as told by your health care provider.  Do not drink alcohol if your health care provider tells you not to drink.  Ask your health care provider if the medicine prescribed to you: ? Requires you to avoid driving or using heavy machinery. ? Can cause constipation. You may need to take these actions to prevent or treat constipation:  Drink enough fluid to keep your urine pale yellow.  Take over-the-counter or prescription medicines.  Eat foods that are high in fiber, such as beans, whole grains, and fresh fruits and vegetables.  Limit foods that are high in fat and processed sugars, such as fried or sweet foods.  Do not drive for 24 hours if you were given a sedative. Biopsy site care   Follow instructions from your health care provider about how to take care of your biopsy site. Make sure you: ? Wash your hands with soap and water before and after you change your bandage (dressing). If soap and water are not available, use hand sanitizer. ? Change your dressing as told by your health care provider. ? Leave stitches (sutures), skin glue, or adhesive strips in place. These skin closures may need to stay in place for 2 weeks or longer. If adhesive strip edges start to loosen and curl up, you may trim the loose edges. Do not remove adhesive strips completely unless your health care provider tells  you to do that.  Do not take baths, swim, or use a hot tub until your health care provider approves. Ask your health care provider if you may take showers. You may only be allowed to take sponge baths.  Check your biopsy site every day for signs of infection. Check for: ? Redness, swelling, or more pain. ? Fluid or blood. ? Warmth. ? Pus or a bad smell. General instructions  Return to your normal activities as told by your health care provider. Ask your health care provider what activities are safe for you.  It is up to you to get the results of your procedure. Ask your health care provider, or the department that is doing the procedure, when your results will be ready.  Keep all follow-up visits as told by your health care provider. This is important. Contact a health care provider if:  You have a fever.  You have redness, swelling, or more pain around your biopsy site.  You have fluid or blood coming from your biopsy site.  Your biopsy site feels warm to the touch.  You have pus or a bad smell coming from your biopsy site.  You have pain that does not get better with medicine. Get help right away if:  You cough up blood.  You have trouble breathing.  You have chest pain.  You lose consciousness. Summary  After the procedure, it is common to have a sore throat and a cough.  Return to your normal activities as told by your  health care provider. Ask your health care provider what activities are safe for you.  Take over-the-counter and prescription medicines only as told by your health care provider.  Report any unusual symptoms to your health care provider. This information is not intended to replace advice given to you by your health care provider. Make sure you discuss any questions you have with your health care provider. Document Revised: 06/12/2018 Document Reviewed: 06/06/2016 Elsevier Patient Education  Reserve.

## 2019-11-25 ENCOUNTER — Telehealth: Payer: Self-pay | Admitting: Emergency Medicine

## 2019-11-25 LAB — SURGICAL PATHOLOGY

## 2019-11-25 NOTE — Telephone Encounter (Signed)
Spoke with pt. She verbalized that she seen results on Mychart that said that her lung biopsy showed cancer. Dr Lamonte Sakai is out of the office this week. She has an appt with Derl Barrow, NP on 12/12/19 to discuss results but pt doesn't feel comfortable waiting that long to discuss. There are no other appt openings sooner on the schedule. Please advise.

## 2019-11-25 NOTE — Telephone Encounter (Signed)
appt scheduled with BW for 12/03/19 at 4 pm  Nothing in office sooner unfortunately

## 2019-11-25 NOTE — Telephone Encounter (Signed)
11/25/2019  I would recommend then getting the patient scheduled for an earlier visit.  If she would like a virtual visit then an APP can always discuss this over the phone.  Although I would prefer typically to have a in office visit.  Wyn Quaker, FNP

## 2019-11-26 ENCOUNTER — Other Ambulatory Visit: Payer: Self-pay

## 2019-11-26 ENCOUNTER — Ambulatory Visit (INDEPENDENT_AMBULATORY_CARE_PROVIDER_SITE_OTHER): Payer: Medicare PPO | Admitting: Podiatry

## 2019-11-26 ENCOUNTER — Encounter: Payer: Self-pay | Admitting: Podiatry

## 2019-11-26 DIAGNOSIS — G5762 Lesion of plantar nerve, left lower limb: Secondary | ICD-10-CM | POA: Diagnosis not present

## 2019-11-26 DIAGNOSIS — L501 Idiopathic urticaria: Secondary | ICD-10-CM | POA: Diagnosis not present

## 2019-11-26 NOTE — Progress Notes (Signed)
   HPI: 74 y.o. 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  Patient states that after her last visit she has noticed significant improvement.  She has been walking without pain.  She states that she is doing so much better.  She is currently taking the meloxicam and wearing the insoles with the offloading metatarsal pad as directed.  Past Medical History:  Diagnosis Date  . Hypothyroidism      Physical Exam: General: The patient is alert and oriented x3 in no acute distress.  Dermatology: Skin is warm, dry and supple bilateral lower extremities. Negative for open lesions or macerations.  Vascular: Palpable pedal pulses bilaterally. No edema or erythema noted. Capillary refill within normal limits.  Neurological: Epicritic and protective threshold grossly intact bilaterally.   Musculoskeletal Exam: Range of motion within normal limits to all pedal and ankle joints bilateral. Muscle strength 5/5 in all groups bilateral.  Significantly improved minimal pain on palpation noted to the left second interspace as well as pain with lateral compression and a positive Mulder sign.  Findings consistent with a Morton's neuroma left second interspace  Assessment: 1.  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 Patient evaluated.  2.  Continue meloxicam as needed 3.  Continue offloading metatarsal pads to the insoles of the shoes.  Additional metatarsal pads were dispensed today 4.  Return to clinic as needed     Edrick Kins, DPM [facility]  Dr. Edrick Kins, DPM    [address], [place] [address]                Office [phone]  Fax [phone]

## 2019-12-03 ENCOUNTER — Encounter: Payer: Self-pay | Admitting: Primary Care

## 2019-12-03 ENCOUNTER — Other Ambulatory Visit: Payer: Self-pay

## 2019-12-03 ENCOUNTER — Ambulatory Visit (INDEPENDENT_AMBULATORY_CARE_PROVIDER_SITE_OTHER): Payer: Medicare PPO | Admitting: Primary Care

## 2019-12-03 ENCOUNTER — Telehealth: Payer: Self-pay | Admitting: Primary Care

## 2019-12-03 DIAGNOSIS — R918 Other nonspecific abnormal finding of lung field: Secondary | ICD-10-CM

## 2019-12-03 NOTE — Telephone Encounter (Signed)
Hi Cynthia Rhodes, I have a patient of Dr. 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 Discussed pathology results today,  would like to refer this patient to [place].  -Derl Barrow

## 2019-12-03 NOTE — Patient Instructions (Addendum)
Well-differentiated lung neuroendocrine (bronchial carcinoid) tumors (NETs) are a rare group of pulmonary neoplasms that are often characterized by indolent clinical behavior. NETs can arise at a number of sites throughout the body, including the thymus, lung, gastrointestinal (GI) tract, and ovary.    Lung NETs are characterized by strikingly heterogeneous pathological features and clinical behavior. At one end of the spectrum are the so-called typical carcinoids, which are well-differentiated, low-grade, slowly growing neoplasms that seldom metastasize to extrathoracic structures. At the other end of the spectrum are the poorly differentiated and high-grade neuroendocrine carcinomas, as typified by small cell lung cancer, which behaves aggressively, with rapid tumor growth and early distant dissemination. The biologic behavior of intermediate-grade (atypical) NETs, which are of intermediate grade and differentiation, is intermediate between low-grade NETs and small cell lung cancer. The terms "typical" and "atypical" carcinoid of the lung correspond roughly to the terms "grade 1" and "grade 2," which are used more commonly in extrathoracic NETs  Patients with localized lung neuroendocrine tumors (NETs), surgical resection is the preferred treatment approach, assuming adequate pulmonary reserve. For patients whose condition does not permit complete resection and for exceptional low-grade cases where the lesion is entirely endobronchial, transbronchoscopic resection may be an alternative   Refer to [facility]/ Dr. [name]

## 2019-12-03 NOTE — Telephone Encounter (Signed)
Thank you :)

## 2019-12-03 NOTE — Telephone Encounter (Signed)
Thanks for the update, we will get her in

## 2019-12-03 NOTE — Progress Notes (Signed)
Virtual Visit via Telephone Note  I connected with Cynthia Rhodes on 12/03/19 at  4:00 PM EDT by telephone and verified that I am speaking with the correct person using two identifiers.  Location: Patient: Home Provider: Office   I discussed the limitations, risks, security and privacy concerns of performing an evaluation and management service by telephone and the availability of in person appointments. I also discussed with the patient that there may be a patient responsible charge related to this service. The patient expressed understanding and agreed to proceed.   History of Present Illness: 74 year old female, never smoked. 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 Patient of Dr. Lamonte Sakai, seen for initial consult on 10/10/19 for 2.2 lobulated anterior right middle lobe mass originally seen on virtual colonoscopy in May 2021. CT chest  Showed 2.8 x 1.6 spiculated right middle lobe mass concerning for primary malignancy. Good sample RML nodules on ENB but cytology and pathology negative. Discussed pros and cons of repeat CT chest I 3 months and plan another procedure if needed. She favored getting procedure now if feasible.   12/03/2019 Patient contacted today to review lung biopsy results. She underwent CT guided lung nodule biopsy on 11/19/19. 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 She is doing well, no acute complaints. Never smoked. No significant shortness of breath or cough. She is not on oxygen. She has never had pulmonary function testing, these have been ordered and need to be completed. Discussed pathology results with Dr. Lamonte Sakai he would like for [place] to refer patient to [facility].   Observations/Objective:  - Very pleasant; no overt shortness of breath, wheezing or cough  Testing: 11/19/19 SURGICAL LUNG BX-  LUNG, RIGHT MIDDLE LOBE, NEEDLE CORE BIOPSY:  - Well-differentiated neuroendocrine tumor (carcinoid)  - The  biopsy consists of a single small core with nests of small blue  cells without necrosis or significant mitotic activity. By  immunohistochemistry, the neoplastic cells are positive for CD56,  chromogranin, TTF-1 and synaptophysin. The proliferative rate by Ki-67  is less than 2%. 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   Imaging:  10/16/19 PET- 1. Hypermetabolic RIGHT middle lobe mass with moderate FDG uptake remains concerning for bronchogenic neoplasm. The smaller nodule in the RIGHT upper lobe does not display increased FDG activity however, the possibility of additional site of disease is not excluded. 2. No signs of adenopathy. 3. Focal area of activity in the colon may correspond to what was felt to represent a focus of formed stool based on mobility. The possibility of a pedunculated colonic lesion is considered. Sigmoid assessment with direct visualization may be warranted. 4. Ovoid hyperdense area in the RIGHT neck may represent residual thyroid tissue or parathyroid. Focused ultrasound may be helpful for further assessment. 5. Well-circumscribed water density structure along the RIGHT flank without surrounding stranding may represent a benign cystic area in the peritoneum but is not well evaluated. This would be in an ideal location for focused ultrasound assessment.  Assessment and Plan:  Right middle lobe lung mass: - 10/16/19 PET scan showed hypermetabolic RML lung mass measuring 3.1 x 1.5cm (SUVmax 4.8) and small right upper lobe nodule measuring 51m which did not displace increased FDG activity. No signs of adenopathy.  - CT guided needle bx RML on 6/30 showed well-differentiated neuroendocrine tumor (carcinoid)  - PFTs have been ordered and need to be scheduled  - [facility]aka[facility]   Follow Up Instructions:   -  1 month with Dr. Lamonte Sakai or APP/ follow-up based on [place] recommendation  I discussed the  assessment and treatment plan with the patient. The patient was provided an opportunity to ask questions and all were answered. The patient agreed with the plan and demonstrated an understanding of the instructions.   The patient was advised to call back or seek an in-person evaluation if the symptoms worsen or if the condition fails to improve as anticipated.  I provided 25 minutes of non-face-to-face time during this encounter.   Martyn Ehrich, NP

## 2019-12-04 ENCOUNTER — Telehealth: Payer: Self-pay | Admitting: Internal Medicine

## 2019-12-04 ENCOUNTER — Encounter: Payer: Self-pay | Admitting: *Deleted

## 2019-12-04 DIAGNOSIS — R918 Other nonspecific abnormal finding of lung field: Secondary | ICD-10-CM

## 2019-12-04 NOTE — Telephone Encounter (Signed)
Received a new pt referral from Dr. Lamonte Sakai for dx of lung cancer. Cynthia Rhodes has been cld and scheduled to see Dr. [name] on 7/21 at 1:30pm w/labs at 1pm. Pt aware to arrive 15 minutes early.

## 2019-12-04 NOTE — Progress Notes (Signed)
I received referral yesterday on Cynthia Rhodes.  Dr. [name] is able to see her on 12/10/19.  I notified new patient coordinator to call and schedule her to be seen with labs on  12/10/19.

## 2019-12-09 LAB — FUNGAL ORGANISM REFLEX

## 2019-12-09 LAB — FUNGUS CULTURE RESULT

## 2019-12-09 LAB — FUNGUS CULTURE WITH STAIN

## 2019-12-10 ENCOUNTER — Inpatient Hospital Stay: Payer: Medicare PPO | Attending: Internal Medicine | Admitting: Internal Medicine

## 2019-12-10 ENCOUNTER — Other Ambulatory Visit: Payer: Self-pay

## 2019-12-10 ENCOUNTER — Encounter: Payer: Self-pay | Admitting: *Deleted

## 2019-12-10 ENCOUNTER — Inpatient Hospital Stay: Payer: Medicare PPO

## 2019-12-10 ENCOUNTER — Encounter: Payer: Self-pay | Admitting: Internal Medicine

## 2019-12-10 DIAGNOSIS — E039 Hypothyroidism, unspecified: Secondary | ICD-10-CM | POA: Diagnosis not present

## 2019-12-10 DIAGNOSIS — Z79899 Other long term (current) drug therapy: Secondary | ICD-10-CM | POA: Diagnosis not present

## 2019-12-10 DIAGNOSIS — R5383 Other fatigue: Secondary | ICD-10-CM | POA: Insufficient documentation

## 2019-12-10 DIAGNOSIS — C342 Malignant neoplasm of middle lobe, bronchus or lung: Secondary | ICD-10-CM | POA: Diagnosis not present

## 2019-12-10 DIAGNOSIS — R0602 Shortness of breath: Secondary | ICD-10-CM | POA: Insufficient documentation

## 2019-12-10 DIAGNOSIS — C3492 Malignant neoplasm of unspecified part of left bronchus or lung: Secondary | ICD-10-CM | POA: Diagnosis not present

## 2019-12-10 DIAGNOSIS — R918 Other nonspecific abnormal finding of lung field: Secondary | ICD-10-CM

## 2019-12-10 DIAGNOSIS — Z803 Family history of malignant neoplasm of breast: Secondary | ICD-10-CM | POA: Insufficient documentation

## 2019-12-10 LAB — CBC WITH DIFFERENTIAL (CANCER CENTER ONLY)
Abs Immature Granulocytes: 0.02 10*3/uL (ref 0.00–0.07)
Basophils Absolute: 0.1 10*3/uL (ref 0.0–0.1)
Basophils Relative: 1 %
Eosinophils Absolute: 0.6 10*3/uL — ABNORMAL HIGH (ref 0.0–0.5)
Eosinophils Relative: 7 %
HCT: 40.6 % (ref 36.0–46.0)
Hemoglobin: 13.2 g/dL (ref 12.0–15.0)
Immature Granulocytes: 0 %
Lymphocytes Relative: 22 %
Lymphs Abs: 1.7 10*3/uL (ref 0.7–4.0)
MCH: 30.1 pg (ref 26.0–34.0)
MCHC: 32.5 g/dL (ref 30.0–36.0)
MCV: 92.7 fL (ref 80.0–100.0)
Monocytes Absolute: 0.9 10*3/uL (ref 0.1–1.0)
Monocytes Relative: 11 %
Neutro Abs: 4.6 10*3/uL (ref 1.7–7.7)
Neutrophils Relative %: 59 %
Platelet Count: 321 10*3/uL (ref 150–400)
RBC: 4.38 MIL/uL (ref 3.87–5.11)
RDW: 13.3 % (ref 11.5–15.5)
WBC Count: 7.9 10*3/uL (ref 4.0–10.5)
nRBC: 0 % (ref 0.0–0.2)

## 2019-12-10 LAB — CMP (CANCER CENTER ONLY)
ALT: 36 U/L (ref 0–44)
AST: 39 U/L (ref 15–41)
Albumin: 3.8 g/dL (ref 3.5–5.0)
Alkaline Phosphatase: 125 U/L (ref 38–126)
Anion gap: 8 (ref 5–15)
BUN: 26 mg/dL — ABNORMAL HIGH (ref 8–23)
CO2: 27 mmol/L (ref 22–32)
Calcium: 9.7 mg/dL (ref 8.9–10.3)
Chloride: 105 mmol/L (ref 98–111)
Creatinine: 0.9 mg/dL (ref 0.44–1.00)
GFR, Est AFR Am: >60 mL/min (ref >60)
GFR, Estimated: >60 mL/min (ref >60)
Glucose, Bld: 110 mg/dL — ABNORMAL HIGH (ref 70–99)
Potassium: 4.1 mmol/L (ref 3.5–5.1)
Sodium: 140 mmol/L (ref 135–145)
Total Bilirubin: 0.5 mg/dL (ref 0.3–1.2)
Total Protein: 7.2 g/dL (ref 6.5–8.1)

## 2019-12-10 NOTE — Progress Notes (Signed)
[place] Telephone:([phone]   [phone]  CONSULT NOTE  REFERRING PHYSICIAN: Dr. Baltazar Apo  REASON FOR CONSULTATION:  74 years old white female recently diagnosed with lung cancer.  HPI Cynthia Rhodes is a 74 y.o. female with past medical history significant for hypothyroidism and no history of smoking.  The patient was seen by gastroenterology and had CT virtual colonoscopy on 10/02/2019 and it showed incidental finding of lobular and somewhat spiculated 3.3 cm mass in the anterior right middle lobe.  There was also nodular areas within the left lower lobe.  The scan of the chest with contrast on 10/06/2019 and it showed 2.8 x 1.6 cm spiculated mass with pleural tail noted in the right middle lobe consistent with primary malignancy.  There was also a 0.7 x 0.5 cm nodule noted in the right upper lobe concerning for metastatic lesion.  The patient had a PET scan on 10/16/2019 and it showed the area of concern in the right middle lobe with lobular margins and some surrounding spiculation measuring 3.1 x 1.5 cm with bandlike area extending to the pleural service with SUV max of 4.8.  There was also 0.5 cm right upper lobe pulmonary nodule with no significant FDG uptake and smaller nodules along the minor fissure unchanged from the previous exam.  The PET scan also showed small suspicious nodule measuring 0.7 cm in the right thoracic inlet without FDG uptake compatible with thyroid tissue.  The patient was seen by Dr. Lamonte Sakai and she had ultrasound of the thyroid on 10/29/2019 and it showed small thyroid without nodule or other indication for biopsy.  On 11/04/2019 the patient underwent bronchoscopy with electromagnetic navigation procedure but the final cytology was not conclusive for malignancy.  The patient was then referred to interventional radiology and on November 19, 2019 she underwent CT-guided biopsy of the right middle lobe lung nodule. The final pathology  ([id number]) showed well-differentiated neuroendocrine tumor, carcinoid. The biopsy consists of a single small core with nests of small blue cells without necrosis or significant mitotic activity. By  immunohistochemistry, the neoplastic cells are positive for CD56, chromogranin, TTF-1 and synaptophysin. The proliferative rate by Ki-67 is less than 2%. 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  The patient was referred to me today for evaluation and recommendation regarding treatment of her condition. When seen today she is feeling fine with no concerning complaints except for fatigue and mild shortness of breath.  She denied having any other symptoms including hot flashes or diarrhea.  She has no weight loss.  She has no nausea, vomiting or abdominal pain.  She has no recent weight loss or night sweats.  She has no chest pain, cough or hemoptysis. Family history significant for mother with breast cancer, father had skin cancer and brother had prostate cancer. The patient is single and has no children.  She is a retired Warden/ranger.  She has no history of smoking and drinks 4 to 6 ounces of wine 3-4 times a week.  She has no history of drug abuse.  HPI  Past Medical History:  Diagnosis Date  . Hypothyroidism     Past Surgical History:  Procedure Laterality Date  . ABDOMINAL HYSTERECTOMY    . BRONCHIAL BIOPSY  11/04/2019   Procedure: BRONCHIAL BIOPSIES;  Surgeon: Collene Gobble, MD;  Location: [facility] ENDOSCOPY;  Service: Pulmonary;;  . BRONCHIAL BRUSHINGS  11/04/2019   Procedure: BRONCHIAL BRUSHINGS;  Surgeon: Collene Gobble, MD;  Location: [facility] ENDOSCOPY;  Service: Pulmonary;;  . BRONCHIAL NEEDLE ASPIRATION BIOPSY  11/04/2019   Procedure: BRONCHIAL NEEDLE ASPIRATION BIOPSIES;  Surgeon: Collene Gobble, MD;  Location[facility] ENDOSCOPY;  Service: Pulmonary;;  . BRONCHIAL WASHINGS  11/04/2019   Procedure: BRONCHIAL WASHINGS;  Surgeon: Collene Gobble, MD;   Location: [facility] ENDOSCOPY;  Service: Pulmonary;;  . BUNIONECTOMY Bilateral   . CHOLECYSTECTOMY    . KNEE SURGERY Bilateral   . REPLACEMENT TOTAL KNEE BILATERAL    . SIGMOIDECTOMY    . TONSILLECTOMY    . VIDEO BRONCHOSCOPY WITH ENDOBRONCHIAL NAVIGATION N/A 11/04/2019   Procedure: VIDEO BRONCHOSCOPY WITH ENDOBRONCHIAL NAVIGATION;  Surgeon: Collene Gobble, MD;  Location: [place] ENDOSCOPY;  Service: Pulmonary;  Laterality: N/A;    Family History  Problem Relation Age of Onset  . Asthma Mother   . Breast cancer Mother     Social History Social History   Tobacco Use  . Smoking status: Never Smoker  . Smokeless tobacco: Never Used  Vaping Use  . Vaping Use: Never used  Substance Use Topics  . Alcohol use: Yes    Alcohol/week: 14.0 standard drinks    Types: 14 Glasses of wine per week  . Drug use: Never    Allergies  Allergen Reactions  . Codeine Nausea Only and Other (See Comments)    Severe headaches   . Drug [Tape] Hives  . Sulfa Antibiotics Nausea And Vomiting    Current Outpatient Medications  Medication Sig Dispense Refill  . Calcium Carb-Cholecalciferol (CALCIUM + D3 PO) Take by mouth daily.    [name] levothyroxine (SYNTHROID) 50 MCG tablet Take 50 mcg by mouth every other day. Alternating with 75 mcg    . levothyroxine (SYNTHROID) 75 MCG tablet Take 75 mcg by mouth every other day. Alternating with 50 mcg    . magnesium oxide (MAG-OX) 400 MG tablet Take 400 mg by mouth daily.    . meloxicam (MOBIC) 15 MG tablet Take 1 tablet (15 mg total) by mouth daily. 30 tablet 1  . Multiple Vitamins-Minerals (HAIR/SKIN/NAILS/BIOTIN PO) Take 1 tablet by mouth daily.    . Multiple Vitamins-Minerals (OCUVITE EYE HEALTH FORMULA PO) Take 1 tablet by mouth daily.    [name] OVER THE COUNTER MEDICATION Take 3 capsules by mouth daily. Balance of Nature    . Propylene Glycol (SYSTANE BALANCE) 0.6 % SOLN Place 1 drop into both eyes daily.    . TURMERIC PO Take 1,000 mg by mouth daily.      No current  facility-administered medications for this visit.    Review of Systems  Constitutional: positive for fatigue Eyes: negative Ears, nose, mouth, throat, and face: negative Respiratory: positive for dyspnea on exertion Cardiovascular: negative Gastrointestinal: negative Genitourinary:negative Integument/breast: negative Hematologic/lymphatic: negative Musculoskeletal:negative Neurological: negative Behavioral/Psych: negative Endocrine: negative Allergic/Immunologic: negative  Physical Exam  ALP:FXTKW, healthy, no distress, well nourished, well developed and anxious SKIN: skin color, texture, turgor are normal, no rashes or significant lesions HEAD: Normocephalic, No masses, lesions, tenderness or abnormalities EYES: normal, PERRLA, Conjunctiva are pink and non-injected EARS: External ears normal, Canals clear OROPHARYNX:no exudate, no erythema and lips, buccal mucosa, and tongue normal  NECK: supple, no adenopathy, no JVD LYMPH:  no palpable lymphadenopathy, no hepatosplenomegaly BREAST:not examined LUNGS: clear to auscultation , and palpation HEART: regular rate & rhythm, no murmurs and no gallops ABDOMEN:abdomen soft, non-tender, normal bowel sounds and no masses or organomegaly BACK: No CVA tenderness, Range of motion is normal EXTREMITIES:no joint deformities, effusion, or inflammation, no edema  NEURO: alert & oriented x 3 with fluent speech, no focal motor/sensory deficits  PERFORMANCE STATUS: ECOG 1  LABORATORY DATA: Lab Results  Component Value Date   WBC 7.9 12/10/2019   HGB 13.2 12/10/2019   HCT 40.6 12/10/2019   MCV 92.7 12/10/2019   PLT 321 12/10/2019      Chemistry      Component Value Date/Time   NA 141 11/04/2019 0646   K 3.9 11/04/2019 0646   CL 106 11/04/2019 0646   CO2 27 11/04/2019 0646   BUN 20 11/04/2019 0646   CREATININE 0.83 11/04/2019 0646      Component Value Date/Time   CALCIUM 9.4 11/04/2019 0646   ALKPHOS 98 11/04/2019 0646   AST  45 (H) 11/04/2019 0646   ALT 42 11/04/2019 0646   BILITOT 0.5 11/04/2019 0646       RADIOGRAPHIC STUDIES: CT Biopsy  Result Date: 2019/12/01 INDICATION: Indeterminate hypermetabolic right middle lobe pulmonary nodule post nondiagnostic attempted bronchoscopic biopsy. As such, patient presents today for CT-guided right middle lobe pulmonary nodule biopsy. EXAM: CT-GUIDED BIOPSY OF HYPERMETABOLIC RIGHT MIDDLE LOBE PULMONARY NODULE COMPARISON:  PET-CT-10/16/2018; chest CT-10/06/2019 MEDICATIONS: None. ANESTHESIA/SEDATION: Fentanyl 100 mcg IV; Versed 1 mg IV Sedation time: 12 minutes; The patient was continuously monitored during the procedure by the interventional radiology nurse under my direct supervision. CONTRAST:  None COMPLICATIONS: None immediate. PROCEDURE: Informed consent was obtained from the patient following an explanation of the procedure, risks, benefits and alternatives. The patient understands,agrees and consents for the procedure. All questions were addressed. A time out was performed prior to the initiation of the procedure. The patient was positioned supine on the CT table and a limited chest CT was performed for procedural planning demonstrating unchanged size and appearance of approximately 3.6 x 1.7 cm macrolobulated right middle lobe pulmonary nodule/mass (image 46, series 2. The operative site was prepped and draped in the usual sterile fashion. Under sterile conditions and local anesthesia, a 17 gauge coaxial needle was advanced into the peripheral aspect of the nodule. Positioning was confirmed with intermittent CT fluoroscopy and followed by the acquisition of 4 core needle biopsies with an 18 gauge core needle biopsy device. The coaxial needle was removed following deployment of a Biosentry plug and superficial hemostasis was achieved with manual compression. Limited post procedural chest CT was negative for pneumothorax or additional complication. A dressing was placed. The  patient tolerated the procedure well without immediate postprocedural complication. The patient was escorted to have an upright chest radiograph. IMPRESSION: Technically successful CT guided core needle core biopsy of indeterminate hypermetabolic right middle lobe pulmonary nodule/mass. Electronically Signed   By: Sandi Mariscal M.D.   On: 2019/12/01 15:03   DG Chest Port 1 View  Result Date: 01-Dec-2019 CLINICAL DATA:  Status post CT-guided biopsy of hypermetabolic right middle lobe pulmonary nodule. EXAM: PORTABLE CHEST 1 VIEW COMPARISON:  Chest x-ray dated 11/04/2019 and chest CT dated 10/06/2019 FINDINGS: There is no pneumothorax after CT directed needle biopsy. Ill-defined mass in the right middle lobe is again noted. Small peripheral mass in the lateral aspect of the left lower lobe appears unchanged. The lungs are otherwise clear. No effusions. No acute bone abnormality. IMPRESSION: 1. No pneumothorax after CT directed needle biopsy. 2. Stable masses in the right middle lobe and left lower lobe. Electronically Signed   By: Lorriane Shire M.D.   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  There was also a suspicious focus in the right upper lobe diagnosed in June 2021.   PLAN: I had a lengthy discussion with the patient today about her current condition and treatment options.  I personally and independently reviewed the scan images and discussed the results with the patient today. I strongly recommend for the patient to see cardiothoracic surgery for consideration of surgical resection of the lesion in the right middle lobe and probably wedge resection of the right upper lobe lesion too. The patient had a lot of question about her condition and all the option available for treatment of her disease and I answered her question to her satisfaction. We will arrange for the patient a follow-up visit  with me after her evaluation by surgery and hopefully after the surgical resection to for close monitoring of her condition. She was advised to call immediately if she has any other concerning symptoms in the interval. The patient voices understanding of current disease status and treatment options and is in agreement with the current care plan.  All questions were answered. The patient knows to call the clinic with any problems, questions or concerns. We can certainly see the patient much sooner if necessary.  Thank you so much for allowing me to participate in the care of Cynthia Rhodes. I will continue to follow up the patient with you and assist in her care.  The total time spent in the appointment was 60 minutes.  Disclaimer: This note was dictated with voice recognition software. Similar sounding words can inadvertently be transcribed and may not be corrected upon review.   Eilleen Kempf December 10, 2019, 1:18 PM

## 2019-12-10 NOTE — Progress Notes (Addendum)
ERROR

## 2019-12-10 NOTE — Progress Notes (Signed)
I spoke with Cynthia Rhodes today during her first visit with Dr. [name].  She has dx of carcinoid tumor and has been referred to thoracic surgery.  I gave and explained information on carcinoid lung cancer and next steps. I reached out to their office with an update on referral.

## 2019-12-11 ENCOUNTER — Telehealth: Payer: Self-pay | Admitting: *Deleted

## 2019-12-11 NOTE — Telephone Encounter (Signed)
I followed up on Cynthia Rhodes's schedule.  She is set up to see Dr. Roxan Hockey on 12/30/19.  I called patient to make sure she was aware of appt.  She is aware and states her anxiety is coming down.  I listened as she explained.  I asked that she call me if needed.

## 2019-12-12 ENCOUNTER — Ambulatory Visit: Payer: Medicare PPO | Admitting: Primary Care

## 2019-12-17 LAB — ACID FAST CULTURE WITH REFLEXED SENSITIVITIES (MYCOBACTERIA): Acid Fast Culture: NEGATIVE

## 2019-12-18 DIAGNOSIS — R195 Other fecal abnormalities: Secondary | ICD-10-CM | POA: Diagnosis not present

## 2019-12-18 DIAGNOSIS — K56699 Other intestinal obstruction unspecified as to partial versus complete obstruction: Secondary | ICD-10-CM | POA: Diagnosis not present

## 2019-12-18 DIAGNOSIS — Z9049 Acquired absence of other specified parts of digestive tract: Secondary | ICD-10-CM | POA: Diagnosis not present

## 2019-12-18 DIAGNOSIS — R948 Abnormal results of function studies of other organs and systems: Secondary | ICD-10-CM | POA: Diagnosis not present

## 2019-12-18 DIAGNOSIS — D3A090 Benign carcinoid tumor of the bronchus and lung: Secondary | ICD-10-CM | POA: Diagnosis not present

## 2019-12-24 DIAGNOSIS — L501 Idiopathic urticaria: Secondary | ICD-10-CM | POA: Diagnosis not present

## 2019-12-30 ENCOUNTER — Other Ambulatory Visit: Payer: Self-pay

## 2019-12-30 ENCOUNTER — Institutional Professional Consult (permissible substitution) (INDEPENDENT_AMBULATORY_CARE_PROVIDER_SITE_OTHER): Payer: Medicare PPO | Admitting: Thoracic Surgery (Cardiothoracic Vascular Surgery)

## 2019-12-30 ENCOUNTER — Ambulatory Visit (INDEPENDENT_AMBULATORY_CARE_PROVIDER_SITE_OTHER): Payer: Medicare PPO | Admitting: Emergency Medicine

## 2019-12-30 ENCOUNTER — Encounter: Payer: Self-pay | Admitting: Thoracic Surgery (Cardiothoracic Vascular Surgery)

## 2019-12-30 VITALS — BP 147/78 | HR 83 | Temp 97.5°F | Resp 16 | Ht 66.0 in | Wt 155.4 lb

## 2019-12-30 DIAGNOSIS — D3A090 Benign carcinoid tumor of the bronchus and lung: Secondary | ICD-10-CM

## 2019-12-30 DIAGNOSIS — C3492 Malignant neoplasm of unspecified part of left bronchus or lung: Secondary | ICD-10-CM | POA: Diagnosis not present

## 2019-12-30 DIAGNOSIS — R918 Other nonspecific abnormal finding of lung field: Secondary | ICD-10-CM | POA: Diagnosis not present

## 2019-12-30 DIAGNOSIS — R911 Solitary pulmonary nodule: Secondary | ICD-10-CM

## 2019-12-30 DIAGNOSIS — C7A090 Malignant carcinoid tumor of the bronchus and lung: Secondary | ICD-10-CM

## 2019-12-30 LAB — PULMONARY FUNCTION TEST
DL/VA % pred: 114 %
DL/VA: 4.63 ml/min/mmHg/L
DLCO cor % pred: 107 %
DLCO cor: 22.06 ml/min/mmHg
DLCO unc % pred: 107 %
DLCO unc: 21.92 ml/min/mmHg
FEF 25-75 Post: 1.79 L/sec
FEF 25-75 Pre: 1.19 L/sec
FEF2575-%Change-Post: 49 %
FEF2575-%Pred-Post: 100 %
FEF2575-%Pred-Pre: 67 %
FEV1-%Change-Post: 9 %
FEV1-%Pred-Post: 88 %
FEV1-%Pred-Pre: 80 %
FEV1-Post: 2.03 L
FEV1-Pre: 1.85 L
FEV1FVC-%Change-Post: 5 %
FEV1FVC-%Pred-Pre: 93 %
FEV6-%Change-Post: 5 %
FEV6-%Pred-Post: 94 %
FEV6-%Pred-Pre: 89 %
FEV6-Post: 2.75 L
FEV6-Pre: 2.61 L
FEV6FVC-%Change-Post: 0 %
FEV6FVC-%Pred-Post: 104 %
FEV6FVC-%Pred-Pre: 104 %
FVC-%Change-Post: 4 %
FVC-%Pred-Post: 89 %
FVC-%Pred-Pre: 86 %
FVC-Post: 2.75 L
FVC-Pre: 2.64 L
Post FEV1/FVC ratio: 74 %
Post FEV6/FVC ratio: 100 %
Pre FEV1/FVC ratio: 70 %
Pre FEV6/FVC Ratio: 100 %
RV % pred: 126 %
RV: 3.02 L
TLC % pred: 105 %
TLC: 5.64 L

## 2019-12-30 NOTE — H&P (View-Only) (Signed)
PCP is Masneri, [name], DO Referring Provider is Curt Bears, MD  Chief Complaint  Patient presents with  . Lung Cancer    CARCINOID of ther RIGHT LUNG per 11/19/19 BX..Surgical consult,  PFT's - today,  PET Scan 10/16/19, Chest CT 10/06/19     HPI: Ms. 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  Cynthia Rhodes is a 74 year old woman with history of hypothyroidism, hysterectomy, degenerative joint disease, bilateral total knee replacements.  She is a lifelong non-smoker.  She recently had a virtual colonoscopy.  She was noted to have a right lung opacity.  CT of the chest showed a right middle lobe lung nodule, measuring 2.8 x 1.6 cm.  There also was a 7 mm nodule in the right upper lobe.  On PET CT the middle lobe nodule measured 3.1 x 1.5 cm.  It had an SUV max of 4.8.  There was no uptake in the right upper lobe nodule.  There was no hilar or mediastinal adenopathy.  Bronchoscopy was nondiagnostic.  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  She was referred to Dr. [name] and now is referred here for consideration for surgical resection.  She has a chronic cough dating back almost 10 years.  It is nonproductive.  She has had decreased energy but no change in appetite or weight loss.  She gets short of breath after walking about a mile.  She does have arthritis.  She does bruise easily.  She has not had any unusual headaches or visual changes.  Zubrod Score: At the time of surgery this patient's most appropriate activity status/level should be described as: []     0    Normal activity, no symptoms [x]     1    Restricted in physical strenuous activity but ambulatory, able to do out light work []     2    Ambulatory and capable of self care, unable to do work activities, up and about >50 % of waking hours                              []     3    Only limited self care, in bed greater than 50% of waking hours []     4     Completely disabled, no self care, confined to bed or chair []     5    Moribund   Past Medical History:  Diagnosis Date  . Hypothyroidism     Past Surgical History:  Procedure Laterality Date  . ABDOMINAL HYSTERECTOMY    . BRONCHIAL BIOPSY  11/04/2019   Procedure: BRONCHIAL BIOPSIES;  Surgeon: Collene Gobble, MD;  Location: [facility] ENDOSCOPY;  Service: Pulmonary;;  . BRONCHIAL BRUSHINGS  11/04/2019   Procedure: BRONCHIAL BRUSHINGS;  Surgeon: Collene Gobble, MD;  Location: [facility] ENDOSCOPY;  Service: Pulmonary;;  . BRONCHIAL NEEDLE ASPIRATION BIOPSY  11/04/2019   Procedure: BRONCHIAL NEEDLE ASPIRATION BIOPSIES;  Surgeon: Collene Gobble, MD;  Location: [facility] ENDOSCOPY;  Service: Pulmonary;;  . BRONCHIAL WASHINGS  11/04/2019   Procedure: BRONCHIAL WASHINGS;  Surgeon: Collene Gobble, MD;  Location: [facility] ENDOSCOPY;  Service: Pulmonary;;  . BUNIONECTOMY Bilateral   . CHOLECYSTECTOMY    . KNEE SURGERY Bilateral   . REPLACEMENT TOTAL KNEE BILATERAL    . SIGMOIDECTOMY    . TONSILLECTOMY    . VIDEO BRONCHOSCOPY WITH ENDOBRONCHIAL NAVIGATION N/A 11/04/2019   Procedure: VIDEO BRONCHOSCOPY WITH ENDOBRONCHIAL  NAVIGATION;  Surgeon: Collene Gobble, MD;  Location: [facility] ENDOSCOPY;  Service: Pulmonary;  Laterality: N/A;    Family History  Problem Relation Age of Onset  . Asthma Mother   . Breast cancer Mother     Social History Social History   Tobacco Use  . Smoking status: Never Smoker  . Smokeless tobacco: Never Used  Vaping Use  . Vaping Use: Never used  Substance Use Topics  . Alcohol use: Yes    Alcohol/week: 14.0 standard drinks    Types: 14 Glasses of wine per week  . Drug use: Never    Current Outpatient Medications  Medication Sig Dispense Refill  . Calcium Carb-Cholecalciferol (CALCIUM + D3 PO) Take by mouth daily.    [name] levothyroxine (SYNTHROID) 50 MCG tablet Take 50 mcg by mouth every other day. Alternating with 75 mcg    . levothyroxine (SYNTHROID) 75 MCG tablet Take 75 mcg by mouth  every other day. Alternating with 50 mcg    . magnesium oxide (MAG-OX) 400 MG tablet Take 400 mg by mouth daily.    . Multiple Vitamins-Minerals (HAIR/SKIN/NAILS/BIOTIN PO) Take 1 tablet by mouth daily.    . Multiple Vitamins-Minerals (OCUVITE EYE HEALTH FORMULA PO) Take 1 tablet by mouth daily.    [name] OVER THE COUNTER MEDICATION Take 3 capsules by mouth daily. Balance of Nature    . Propylene Glycol (SYSTANE BALANCE) 0.6 % SOLN Place 1 drop into both eyes daily.    . TURMERIC PO Take 1,000 mg by mouth daily.      No current facility-administered medications for this visit.    Allergies  Allergen Reactions  . Codeine Nausea Only and Other (See Comments)    Severe headaches   . Drug [Tape] Hives  . Sulfa Antibiotics Nausea And Vomiting    Review of Systems  Constitutional: Positive for fatigue. Negative for activity change and unexpected weight change.  HENT: Negative for trouble swallowing and voice change.   Respiratory: Positive for cough. Negative for wheezing.   Cardiovascular: Negative for chest pain and leg swelling.  Gastrointestinal: Negative for abdominal distention and abdominal pain.  Genitourinary: Negative for difficulty urinating and dyspareunia.  Musculoskeletal: Positive for arthralgias, joint swelling and myalgias (Leg cramps).  Neurological: Negative for seizures, syncope and weakness.  Hematological: Negative for adenopathy. Bruises/bleeds easily.  All other systems reviewed and are negative.   BP (!) 147/78 (BP Location: Right Arm, Patient Position: Sitting, Cuff Size: Normal)   Pulse 83   Temp (!) 97.5 F (36.4 C)   Resp 16   Ht 5\' 6"  (1.676 m)   Wt 155 lb 6.4 oz (70.5 kg)   SpO2 96% Comment: RA  BMI 25.08 kg/m  Physical Exam Vitals reviewed.  Constitutional:      General: She is not in acute distress.    Appearance: Normal appearance.  HENT:     Head: Normocephalic and atraumatic.  Eyes:     General: No scleral icterus.    Extraocular Movements:  Extraocular movements intact.  Cardiovascular:     Rate and Rhythm: Normal rate and regular rhythm.     Pulses: Normal pulses.     Heart sounds: Normal heart sounds. No murmur heard.  No friction rub. No gallop.   Pulmonary:     Effort: Pulmonary effort is normal. No respiratory distress.     Breath sounds: Normal breath sounds. No wheezing.  Abdominal:     General: There is no distension.     Palpations: Abdomen is  soft.  Musculoskeletal:        General: No swelling.     Cervical back: Neck supple.  Lymphadenopathy:     Cervical: No cervical adenopathy.  Skin:    General: Skin is warm and dry.  Neurological:     General: No focal deficit present.     Mental Status: She is alert and oriented to person, place, and time.     Cranial Nerves: No cranial nerve deficit.     Motor: No weakness.     Gait: Gait normal.    Diagnostic Tests: NUCLEAR MEDICINE PET SKULL BASE TO THIGH  TECHNIQUE: 7.7 mCi F-18 FDG was injected intravenously. Full-ring PET imaging was performed from the skull base to thigh after the radiotracer. CT data was obtained and used for attenuation correction and anatomic localization.  Fasting blood glucose: 111 mg/dl  COMPARISON:  CT chest of 10/06/2019  FINDINGS: Mediastinal blood pool activity: SUV max 2.39  Liver activity: SUV max NA  NECK: No hypermetabolic lymph nodes in the neck.  Incidental CT findings: none  CHEST: Area of concern in the RIGHT middle lobe with lobular margins and some surrounding spiculation measuring 3.1 x 1.5 cm with bandlike areas extending to the pleural surface, stable when measured in a similar fashion compared to the very recent comparison study. (SUVmax = 4.8) (image 43, series 8)  5 mm RIGHT upper lobe pulmonary nodule (image 32, series 8) demonstrates no significant FDG uptake well below blood pool activity. Smaller nodules along the minor fissure unchanged from previous, very recent imaging without FDG  uptake. No sign of mediastinal nodal disease  Incidental CT findings: 7 mm RIGHT thoracic inlet lesion without FDG uptake, hyperdensity 86 Hounsfield units. Just at the level of the thyroid bed but more focal than expected for thyroid tissue. Density would be compatible with thyroid tissue.  Minimal atherosclerotic changes in the thoracic aorta. No pericardial effusion.  ABDOMEN/PELVIS: Focus of increased metabolic activity in the sigmoid colon at the site of for was felt to represent a small focus of stool along the wall of the colon. (SUVmax = 8.7) corresponding abnormality felt to represent a focus of stool in the colon given mobile features, question attachment to adjacent haustration potentially a mobile polyp in the sigmoid colon.  Incidental CT findings: Ovoid cystic area along the RIGHT flank 5.4 x 1.7 cm (image 142, series 4) no sign of increased FDG uptake or surrounding stranding. No change from previous virtual colonoscopy. Calcific atherosclerotic changes throughout a nonaneurysmal abdominal aorta. Post hysterectomy.  SKELETON: No focal hypermetabolic activity to suggest skeletal metastasis.  Incidental CT findings: none  IMPRESSION: 1. Hypermetabolic RIGHT middle lobe mass with moderate FDG uptake remains concerning for bronchogenic neoplasm. The smaller nodule in the RIGHT upper lobe does not display increased FDG activity however, the possibility of additional site of disease is not excluded. 2. No signs of adenopathy. 3. Focal area of activity in the colon may correspond to what was felt to represent a focus of formed stool based on mobility. The possibility of a pedunculated colonic lesion is considered. Sigmoid assessment with direct visualization may be warranted. 4. Ovoid hyperdense area in the RIGHT neck may represent residual thyroid tissue or parathyroid. Focused ultrasound may be helpful for further assessment. 5. Well-circumscribed water  density structure along the RIGHT flank without surrounding stranding may represent a benign cystic area in the peritoneum but is not well evaluated. This would be in an ideal location for focused ultrasound assessment.  These  results were called by telephone at the time of interpretation on 10/16/2019 at 11:55 am to provider [facility] , who verbally acknowledged these results.   Electronically Signed   By: Zetta Bills M.D.   On: 10/16/2019 11:55 I personally reviewed the PET/CT images and concur with the findings noted above  Pulmonary function testing 12/30/2019 FVC 2.64 (86%) FEV1 1.85 (80%) FEV1 2.03 (88%) postbronchodilator TLC 5.64 (105%) DLCO 22.06 (107%)  Impression: Joany "[name]" Baldonado is a 74 year old non-smoker who was incidentally noted to have a right middle lobe nodule on a CT done for a virtual colonoscopy.  That was confirmed with a CT of the chest.  There also was a small right upper lobe nodule.  PET/CT showed the right middle lobe nodule was hypermetabolic.  There was no adenopathy.  Biopsy showed a well-differentiated carcinoid tumor.  I reviewed the CT and PET/CT images with Ms. Rubi and her significant other.  We discussed the management of carcinoid tumors.  Surgical resection is the gold standard.  In her case that would involve right middle lobectomy.  We also discussed options for dealing with the right upper lobe nodule.  One option would be radiographic observation.  The other option would be wedge resection at the time of surgery.  Unfortunately this nodule will not be easily located.  So we discussed the possibility of a navigational bronchoscopy to mark that nodule prior to resection.  I discussed the proposed operation of electromagnetic navigational bronchoscopy for marking of the right upper lobe nodule followed by robotic right VATS for right middle lobectomy and wedge resection of the right upper lobe nodule.  I informed her of the  general nature of the procedure including the need for general anesthesia, the incisions to be used, use of drains to postoperatively, the expected hospital stay, and the overall recovery.  She has good lung function in her middle lobe is very small so I do not think she will have any long-term effects on her functional status.  I informed her of the indications, risks, benefits, and alternatives.  She understands the risks include, but not limited to death, MI, DVT, PE, bleeding, possible need for transfusion, infection, prolonged air leak, cardiac arrhythmias, as well as the possibility of other unforeseeable complications.  She understands accepts the risk and agrees to proceed.  Plan: We will likely need new CT for planning for navigational bronchoscopy.  It has been 3 months since her most recent CT, so is not a bad idea anyway.  Navigational bronchoscopy for marking, robotic right VATS for right middle lobectomy and wedge of right upper lobe nodule on Thursday, January 15, 2020  Melrose Nakayama, MD [facility] [phone]

## 2019-12-30 NOTE — Progress Notes (Signed)
Full PFT performed today. °

## 2019-12-30 NOTE — Progress Notes (Signed)
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  Cynthia Rhodes is a 74 year old woman with history of hypothyroidism, hysterectomy, degenerative joint disease, bilateral total knee replacements.  She is a lifelong non-smoker.  She recently had a virtual colonoscopy.  She was noted to have a right lung opacity.  CT of the chest showed a right middle lobe lung nodule, measuring 2.8 x 1.6 cm.  There also was a 7 mm nodule in the right upper lobe.  On PET CT the middle lobe nodule measured 3.1 x 1.5 cm.  It had an SUV max of 4.8.  There was no uptake in the right upper lobe nodule.  There was no hilar or mediastinal adenopathy.  Bronchoscopy was nondiagnostic.  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  She was referred to Dr. [name] and now is referred here for consideration for surgical resection.  She has a chronic cough dating back almost 10 years.  It is nonproductive.  She has had decreased energy but no change in appetite or weight loss.  She gets short of breath after walking about a mile.  She does have arthritis.  She does bruise easily.  She has not had any unusual headaches or visual changes.  Zubrod Score: At the time of surgery this patients most appropriate activity status/level should be described as: []     0    Normal activity, no symptoms [x]     1    Restricted in physical strenuous activity but ambulatory, able to do out light work []     2    Ambulatory and capable of self care, unable to do work activities, up and about >50 % of waking hours                              []     3    Only limited self care, in bed greater than 50% of waking hours []     4     Completely disabled, no self care, confined to bed or chair []     5    Moribund   Past Medical History:  Diagnosis Date   Hypothyroidism     Past Surgical History:  Procedure Laterality Date   ABDOMINAL HYSTERECTOMY     BRONCHIAL BIOPSY  11/04/2019   Procedure: BRONCHIAL BIOPSIES;  Surgeon: Collene Gobble, MD;  Location: [place];  Service: Pulmonary;;   BRONCHIAL BRUSHINGS  11/04/2019   Procedure: BRONCHIAL BRUSHINGS;  Surgeon: Collene Gobble, MD;  Location: [facility] ENDOSCOPY;  Service: Pulmonary;;   BRONCHIAL NEEDLE ASPIRATION BIOPSY  11/04/2019   Procedure: BRONCHIAL NEEDLE ASPIRATION BIOPSIES;  Surgeon: Collene Gobble, MD;  Location: [place];  Service: Pulmonary;;   BRONCHIAL WASHINGS  11/04/2019   Procedure: BRONCHIAL WASHINGS;  Surgeon: Collene Gobble, MD;  Location: [facility] ENDOSCOPY;  Service: Pulmonary;;   BUNIONECTOMY Bilateral    CHOLECYSTECTOMY     KNEE SURGERY Bilateral    REPLACEMENT TOTAL KNEE BILATERAL     SIGMOIDECTOMY     TONSILLECTOMY     VIDEO BRONCHOSCOPY WITH ENDOBRONCHIAL NAVIGATION N/A 11/04/2019   Procedure: VIDEO BRONCHOSCOPY WITH ENDOBRONCHIAL  NAVIGATION;  Surgeon: Collene Gobble, MD;  Location: [facility] ENDOSCOPY;  Service: Pulmonary;  Laterality: N/A;    Family History  Problem Relation Age of Onset   Asthma Mother    Breast cancer Mother     Social History Social History   Tobacco Use   Smoking status: Never Smoker   Smokeless tobacco: Never Used  Vaping Use   Vaping Use: Never used  Substance Use Topics   Alcohol use: Yes    Alcohol/week: 14.0 standard drinks    Types: 14 Glasses of wine per week   Drug use: Never    Current Outpatient Medications  Medication Sig Dispense Refill   Calcium Carb-Cholecalciferol (CALCIUM + D3 PO) Take by mouth daily.     levothyroxine (SYNTHROID) 50 MCG tablet Take 50 mcg by mouth every other day. Alternating with 75 mcg     levothyroxine (SYNTHROID) 75 MCG tablet Take 75 mcg by mouth  every other day. Alternating with 50 mcg     magnesium oxide (MAG-OX) 400 MG tablet Take 400 mg by mouth daily.     Multiple Vitamins-Minerals (HAIR/SKIN/NAILS/BIOTIN PO) Take 1 tablet by mouth daily.     Multiple Vitamins-Minerals (OCUVITE EYE HEALTH FORMULA PO) Take 1 tablet by mouth daily.     OVER THE COUNTER MEDICATION Take 3 capsules by mouth daily. Balance of Nature     Propylene Glycol (SYSTANE BALANCE) 0.6 % SOLN Place 1 drop into both eyes daily.     TURMERIC PO Take 1,000 mg by mouth daily.      No current facility-administered medications for this visit.    Allergies  Allergen Reactions   Codeine Nausea Only and Other (See Comments)    Severe headaches    Drug [Tape] Hives   Sulfa Antibiotics Nausea And Vomiting    Review of Systems  Constitutional: Positive for fatigue. Negative for activity change and unexpected weight change.  HENT: Negative for trouble swallowing and voice change.   Respiratory: Positive for cough. Negative for wheezing.   Cardiovascular: Negative for chest pain and leg swelling.  Gastrointestinal: Negative for abdominal distention and abdominal pain.  Genitourinary: Negative for difficulty urinating and dyspareunia.  Musculoskeletal: Positive for arthralgias, joint swelling and myalgias (Leg cramps).  Neurological: Negative for seizures, syncope and weakness.  Hematological: Negative for adenopathy. Bruises/bleeds easily.  All other systems reviewed and are negative.   BP (!) 147/78 (BP Location: Right Arm, Patient Position: Sitting, Cuff Size: Normal)    Pulse 83    Temp (!) 97.5 F (36.4 C)    Resp 16    Ht 5\' 6"  (1.676 m)    Wt 155 lb 6.4 oz (70.5 kg)    SpO2 96% Comment: RA   BMI 25.08 kg/m  Physical Exam Vitals reviewed.  Constitutional:      General: She is not in acute distress.    Appearance: Normal appearance.  HENT:     Head: Normocephalic and atraumatic.  Eyes:     General: No scleral icterus.    Extraocular Movements:  Extraocular movements intact.  Cardiovascular:     Rate and Rhythm: Normal rate and regular rhythm.     Pulses: Normal pulses.     Heart sounds: Normal heart sounds. No murmur heard.  No friction rub. No gallop.   Pulmonary:     Effort: Pulmonary effort is normal. No respiratory distress.     Breath sounds: Normal breath sounds. No wheezing.  Abdominal:     General: There is no distension.  Palpations: Abdomen is soft.  Musculoskeletal:        General: No swelling.     Cervical back: Neck supple.  Lymphadenopathy:     Cervical: No cervical adenopathy.  Skin:    General: Skin is warm and dry.  Neurological:     General: No focal deficit present.     Mental Status: She is alert and oriented to person, place, and time.     Cranial Nerves: No cranial nerve deficit.     Motor: No weakness.     Gait: Gait normal.    Diagnostic Tests: NUCLEAR MEDICINE PET SKULL BASE TO THIGH  TECHNIQUE: 7.7 mCi F-18 FDG was injected intravenously. Full-ring PET imaging was performed from the skull base to thigh after the radiotracer. CT data was obtained and used for attenuation correction and anatomic localization.  Fasting blood glucose: 111 mg/dl  COMPARISON:  CT chest of 10/06/2019  FINDINGS: Mediastinal blood pool activity: SUV max 2.39  Liver activity: SUV max NA  NECK: No hypermetabolic lymph nodes in the neck.  Incidental CT findings: none  CHEST: Area of concern in the RIGHT middle lobe with lobular margins and some surrounding spiculation measuring 3.1 x 1.5 cm with bandlike areas extending to the pleural surface, stable when measured in a similar fashion compared to the very recent comparison study. (SUVmax = 4.8) (image 43, series 8)  5 mm RIGHT upper lobe pulmonary nodule (image 32, series 8) demonstrates no significant FDG uptake well below blood pool activity. Smaller nodules along the minor fissure unchanged from previous, very recent imaging without FDG  uptake. No sign of mediastinal nodal disease  Incidental CT findings: 7 mm RIGHT thoracic inlet lesion without FDG uptake, hyperdensity 86 Hounsfield units. Just at the level of the thyroid bed but more focal than expected for thyroid tissue. Density would be compatible with thyroid tissue.  Minimal atherosclerotic changes in the thoracic aorta. No pericardial effusion.  ABDOMEN/PELVIS: Focus of increased metabolic activity in the sigmoid colon at the site of for was felt to represent a small focus of stool along the wall of the colon. (SUVmax = 8.7) corresponding abnormality felt to represent a focus of stool in the colon given mobile features, question attachment to adjacent haustration potentially a mobile polyp in the sigmoid colon.  Incidental CT findings: Ovoid cystic area along the RIGHT flank 5.4 x 1.7 cm (image 142, series 4) no sign of increased FDG uptake or surrounding stranding. No change from previous virtual colonoscopy. Calcific atherosclerotic changes throughout a nonaneurysmal abdominal aorta. Post hysterectomy.  SKELETON: No focal hypermetabolic activity to suggest skeletal metastasis.  Incidental CT findings: none  IMPRESSION: 1. Hypermetabolic RIGHT middle lobe mass with moderate FDG uptake remains concerning for bronchogenic neoplasm. The smaller nodule in the RIGHT upper lobe does not display increased FDG activity however, the possibility of additional site of disease is not excluded. 2. No signs of adenopathy. 3. Focal area of activity in the colon may correspond to what was felt to represent a focus of formed stool based on mobility. The possibility of a pedunculated colonic lesion is considered. Sigmoid assessment with direct visualization may be warranted. 4. Ovoid hyperdense area in the RIGHT neck may represent residual thyroid tissue or parathyroid. Focused ultrasound may be helpful for further assessment. 5. Well-circumscribed water  density structure along the RIGHT flank without surrounding stranding may represent a benign cystic area in the peritoneum but is not well evaluated. This would be in an ideal location for focused ultrasound  assessment.  These results were called by telephone at the time of interpretation on 10/16/2019 at 11:55 am to provider [facility] , who verbally acknowledged these results.   Electronically Signed   By: Zetta Bills M.D.   On: 10/16/2019 11:55 I personally reviewed the PET/CT images and concur with the findings noted above  Pulmonary function testing 12/30/2019 FVC 2.64 (86%) FEV1 1.85 (80%) FEV1 2.03 (88%) postbronchodilator TLC 5.64 (105%) DLCO 22.06 (107%)  Impression: Lovina "[name]" Rhodes is a 74 year old non-smoker who was incidentally noted to have a right middle lobe nodule on a CT done for a virtual colonoscopy.  That was confirmed with a CT of the chest.  There also was a small right upper lobe nodule.  PET/CT showed the right middle lobe nodule was hypermetabolic.  There was no adenopathy.  Biopsy showed a well-differentiated carcinoid tumor.  I reviewed the CT and PET/CT images with Ms. Szuch and her significant other.  We discussed the management of carcinoid tumors.  Surgical resection is the gold standard.  In her case that would involve right middle lobectomy.  We also discussed options for dealing with the right upper lobe nodule.  One option would be radiographic observation.  The other option would be wedge resection at the time of surgery.  Unfortunately this nodule will not be easily located.  So we discussed the possibility of a navigational bronchoscopy to mark that nodule prior to resection.  I discussed the proposed operation of electromagnetic navigational bronchoscopy for marking of the right upper lobe nodule followed by robotic right VATS for right middle lobectomy and wedge resection of the right upper lobe nodule.  I informed her of the  general nature of the procedure including the need for general anesthesia, the incisions to be used, use of drains to postoperatively, the expected hospital stay, and the overall recovery.  She has good lung function in her middle lobe is very small so I do not think she will have any long-term effects on her functional status.  I informed her of the indications, risks, benefits, and alternatives.  She understands the risks include, but not limited to death, MI, DVT, PE, bleeding, possible need for transfusion, infection, prolonged air leak, cardiac arrhythmias, as well as the possibility of other unforeseeable complications.  She understands accepts the risk and agrees to proceed.  Plan: We will likely need new CT for planning for navigational bronchoscopy.  It has been 3 months since her most recent CT, so is not a bad idea anyway.  Navigational bronchoscopy for marking, robotic right VATS for right middle lobectomy and wedge of right upper lobe nodule on Thursday, January 15, 2020  Melrose Nakayama, MD [facility] [phone]

## 2019-12-30 NOTE — Progress Notes (Signed)
      [address]       [place] [address]             [phone]       

## 2019-12-31 DIAGNOSIS — M9904 Segmental and somatic dysfunction of sacral region: Secondary | ICD-10-CM | POA: Diagnosis not present

## 2019-12-31 DIAGNOSIS — M9905 Segmental and somatic dysfunction of pelvic region: Secondary | ICD-10-CM | POA: Diagnosis not present

## 2019-12-31 DIAGNOSIS — M9903 Segmental and somatic dysfunction of lumbar region: Secondary | ICD-10-CM | POA: Diagnosis not present

## 2019-12-31 DIAGNOSIS — M9901 Segmental and somatic dysfunction of cervical region: Secondary | ICD-10-CM | POA: Diagnosis not present

## 2020-01-08 ENCOUNTER — Other Ambulatory Visit: Payer: Medicare PPO

## 2020-01-09 DIAGNOSIS — H35372 Puckering of macula, left eye: Secondary | ICD-10-CM | POA: Diagnosis not present

## 2020-01-09 DIAGNOSIS — H35342 Macular cyst, hole, or pseudohole, left eye: Secondary | ICD-10-CM | POA: Diagnosis not present

## 2020-01-09 DIAGNOSIS — H26493 Other secondary cataract, bilateral: Secondary | ICD-10-CM | POA: Diagnosis not present

## 2020-01-12 NOTE — Progress Notes (Signed)
[facility] [place], [place] - [address] SEC OF [address] [place] [address] Phone: [phone] Fax: [phone]      Your procedure is scheduled on Thursday 01/15/2020.  Report to [facility] Main Entrance "A" at 08:15 A.M., and check in at the Admitting office.  Call this number if you have problems the morning of surgery:  [phone]  Call [phone] if you have any questions prior to your surgery date Monday-Friday 8am-4pm    Remember:  Do not eat or drink after midnight the night before your surgery    Take these medicines the morning of surgery with A SIP OF WATER: Levothyroxine (Synthroid) Propylene Glycol (Systane balance) eye drop Acetaminophen (Tylenol) - if needed for pain   As of today, STOP taking any Turmeric, Aspirin (unless otherwise instructed by your surgeon) Aleve, Naproxen, Ibuprofen, Motrin, Advil, Goody's, BC's, all herbal medications, fish oil, and all vitamins.                      Do not wear jewelry, make up, or nail polish            Do not wear lotions, powders, perfumes, or deodorant.            Do not shave 48 hours prior to surgery.  Men may shave face and neck.            Do not bring valuables to the hospital.            [facility] is not responsible for any belongings or valuables.  Do NOT Smoke (Tobacco/Vaping) or drink Alcohol 24 hours prior to your procedure  If you use a CPAP at night, you may bring all equipment for your overnight stay.   Contacts, glasses, dentures or bridgework may not be worn into surgery.      For patients admitted to the hospital, discharge time will be determined by your treatment team.   Patients discharged the day of surgery will not be allowed to drive home, and someone needs to stay with them for 24 hours.    Special instructions:   [facility]- Preparing For Surgery  Before surgery, you can play an important role. Because skin is  not sterile, your skin needs to be as free of germs as possible. You can reduce the number of germs on your skin by washing with CHG (chlorahexidine gluconate) Soap before surgery.  CHG is an antiseptic cleaner which kills germs and bonds with the skin to continue killing germs even after washing.    Oral Hygiene is also important to reduce your risk of infection.  Remember - BRUSH YOUR TEETH THE MORNING OF SURGERY WITH YOUR REGULAR TOOTHPASTE  Please do not use if you have an allergy to CHG or antibacterial soaps. If your skin becomes reddened/irritated stop using the CHG.  Do not shave (including legs and underarms) for at least 48 hours prior to first CHG shower. It is OK to shave your face.  Please follow these instructions carefully.   1. Shower the NIGHT BEFORE SURGERY and the MORNING OF SURGERY with CHG Soap.   2. If you chose to wash your hair, wash your hair first as usual with your normal shampoo.  3. After you shampoo, rinse your hair and body thoroughly to remove the shampoo.  4. Use CHG as you would any other liquid soap. You can apply CHG  directly to the skin and wash gently with a scrungie or a clean washcloth.   5. Apply the CHG Soap to your body ONLY FROM THE NECK DOWN.  Do not use on open wounds or open sores. Avoid contact with your eyes, ears, mouth and genitals (private parts). Wash Face and genitals (private parts)  with your normal soap.   6. Wash thoroughly, paying special attention to the area where your surgery will be performed.  7. Thoroughly rinse your body with warm water from the neck down.  8. DO NOT shower/wash with your normal soap after using and rinsing off the CHG Soap.  9. Pat yourself dry with a CLEAN TOWEL.  10. Wear CLEAN PAJAMAS to bed the night before surgery  11. Place CLEAN SHEETS on your bed the night of your first shower and DO NOT SLEEP WITH PETS.   Day of Surgery: Shower with CHG soap as directed Wear Clean/Comfortable clothing the  morning of surgery Do not apply any deodorants/lotions.   Remember to brush your teeth WITH YOUR REGULAR TOOTHPASTE.   Please read over the following fact sheets that you were given.

## 2020-01-13 ENCOUNTER — Encounter (HOSPITAL_COMMUNITY): Payer: Self-pay

## 2020-01-13 ENCOUNTER — Other Ambulatory Visit: Payer: Self-pay

## 2020-01-13 ENCOUNTER — Encounter (HOSPITAL_COMMUNITY)
Admission: RE | Admit: 2020-01-13 | Discharge: 2020-01-13 | Disposition: A | Discharge status: Home / Self Care | Payer: Medicare PPO | Source: Ambulatory Visit | Attending: Thoracic Surgery (Cardiothoracic Vascular Surgery) | Admitting: Thoracic Surgery (Cardiothoracic Vascular Surgery)

## 2020-01-13 ENCOUNTER — Other Ambulatory Visit (HOSPITAL_COMMUNITY)
Admission: RE | Admit: 2020-01-13 | Discharge: 2020-01-13 | Disposition: A | Discharge status: Home / Self Care | Payer: Medicare PPO | Source: Ambulatory Visit | Attending: Thoracic Surgery (Cardiothoracic Vascular Surgery) | Admitting: Thoracic Surgery (Cardiothoracic Vascular Surgery)

## 2020-01-13 DIAGNOSIS — E039 Hypothyroidism, unspecified: Secondary | ICD-10-CM | POA: Diagnosis not present

## 2020-01-13 DIAGNOSIS — Z825 Family history of asthma and other chronic lower respiratory diseases: Secondary | ICD-10-CM | POA: Diagnosis not present

## 2020-01-13 DIAGNOSIS — R911 Solitary pulmonary nodule: Secondary | ICD-10-CM

## 2020-01-13 DIAGNOSIS — Z803 Family history of malignant neoplasm of breast: Secondary | ICD-10-CM | POA: Diagnosis not present

## 2020-01-13 DIAGNOSIS — J9811 Atelectasis: Secondary | ICD-10-CM | POA: Diagnosis not present

## 2020-01-13 DIAGNOSIS — Z4682 Encounter for fitting and adjustment of non-vascular catheter: Secondary | ICD-10-CM | POA: Diagnosis not present

## 2020-01-13 DIAGNOSIS — Z01818 Encounter for other preprocedural examination: Secondary | ICD-10-CM | POA: Diagnosis not present

## 2020-01-13 DIAGNOSIS — J90 Pleural effusion, not elsewhere classified: Secondary | ICD-10-CM | POA: Diagnosis not present

## 2020-01-13 DIAGNOSIS — J439 Emphysema, unspecified: Secondary | ICD-10-CM | POA: Diagnosis not present

## 2020-01-13 DIAGNOSIS — R9389 Abnormal findings on diagnostic imaging of other specified body structures: Secondary | ICD-10-CM | POA: Diagnosis not present

## 2020-01-13 DIAGNOSIS — Z79890 Hormone replacement therapy (postmenopausal): Secondary | ICD-10-CM | POA: Diagnosis not present

## 2020-01-13 DIAGNOSIS — Z885 Allergy status to narcotic agent status: Secondary | ICD-10-CM | POA: Diagnosis not present

## 2020-01-13 DIAGNOSIS — C7A090 Malignant carcinoid tumor of the bronchus and lung: Secondary | ICD-10-CM | POA: Diagnosis not present

## 2020-01-13 DIAGNOSIS — M1990 Unspecified osteoarthritis, unspecified site: Secondary | ICD-10-CM | POA: Diagnosis not present

## 2020-01-13 DIAGNOSIS — C3481 Malignant neoplasm of overlapping sites of right bronchus and lung: Secondary | ICD-10-CM | POA: Diagnosis not present

## 2020-01-13 DIAGNOSIS — C342 Malignant neoplasm of middle lobe, bronchus or lung: Secondary | ICD-10-CM | POA: Diagnosis present

## 2020-01-13 DIAGNOSIS — Z888 Allergy status to other drugs, medicaments and biological substances status: Secondary | ICD-10-CM | POA: Diagnosis not present

## 2020-01-13 DIAGNOSIS — J8410 Pulmonary fibrosis, unspecified: Secondary | ICD-10-CM | POA: Diagnosis not present

## 2020-01-13 DIAGNOSIS — Z96653 Presence of artificial knee joint, bilateral: Secondary | ICD-10-CM | POA: Diagnosis not present

## 2020-01-13 DIAGNOSIS — Z9889 Other specified postprocedural states: Secondary | ICD-10-CM | POA: Diagnosis not present

## 2020-01-13 DIAGNOSIS — C7A8 Other malignant neuroendocrine tumors: Secondary | ICD-10-CM | POA: Diagnosis not present

## 2020-01-13 DIAGNOSIS — C3411 Malignant neoplasm of upper lobe, right bronchus or lung: Secondary | ICD-10-CM | POA: Diagnosis not present

## 2020-01-13 DIAGNOSIS — Z20822 Contact with and (suspected) exposure to covid-19: Secondary | ICD-10-CM | POA: Diagnosis not present

## 2020-01-13 DIAGNOSIS — J939 Pneumothorax, unspecified: Secondary | ICD-10-CM | POA: Diagnosis not present

## 2020-01-13 DIAGNOSIS — Z882 Allergy status to sulfonamides status: Secondary | ICD-10-CM | POA: Diagnosis not present

## 2020-01-13 DIAGNOSIS — J852 Abscess of lung without pneumonia: Secondary | ICD-10-CM | POA: Diagnosis not present

## 2020-01-13 HISTORY — DX: Peripheral vascular disease, unspecified: I73.9

## 2020-01-13 HISTORY — DX: Personal history of other diseases of the digestive system: Z87.19

## 2020-01-13 LAB — BLOOD GAS, ARTERIAL
Acid-Base Excess: 1.7 mmol/L (ref 0.0–2.0)
Bicarbonate: 25.3 mmol/L (ref 20.0–28.0)
Drawn by: 58793
FIO2: 21
O2 Saturation: 97.3 %
Patient temperature: 37
pCO2 arterial: 36.8 mmHg (ref 32.0–48.0)
pH, Arterial: 7.452 — ABNORMAL HIGH (ref 7.350–7.450)
pO2, Arterial: 91.7 mmHg (ref 83.0–108.0)

## 2020-01-13 LAB — CBC
HCT: 42.4 % (ref 36.0–46.0)
Hemoglobin: 13.5 g/dL (ref 12.0–15.0)
MCH: 29.4 pg (ref 26.0–34.0)
MCHC: 31.8 g/dL (ref 30.0–36.0)
MCV: 92.4 fL (ref 80.0–100.0)
Platelets: 348 10*3/uL (ref 150–400)
RBC: 4.59 MIL/uL (ref 3.87–5.11)
RDW: 13.2 % (ref 11.5–15.5)
WBC: 7.9 10*3/uL (ref 4.0–10.5)
nRBC: 0 % (ref 0.0–0.2)

## 2020-01-13 LAB — APTT: aPTT: 29 seconds (ref 24–36)

## 2020-01-13 LAB — COMPREHENSIVE METABOLIC PANEL
ALT: 28 U/L (ref 0–44)
AST: 35 U/L (ref 15–41)
Albumin: 3.7 g/dL (ref 3.5–5.0)
Alkaline Phosphatase: 105 U/L (ref 38–126)
Anion gap: 11 (ref 5–15)
BUN: 17 mg/dL (ref 8–23)
CO2: 22 mmol/L (ref 22–32)
Calcium: 9.6 mg/dL (ref 8.9–10.3)
Chloride: 106 mmol/L (ref 98–111)
Creatinine, Ser: 0.71 mg/dL (ref 0.44–1.00)
GFR calc Af Amer: >60 mL/min (ref >60)
GFR calc non Af Amer: >60 mL/min (ref >60)
Glucose, Bld: 102 mg/dL — ABNORMAL HIGH (ref 70–99)
Potassium: 4.2 mmol/L (ref 3.5–5.1)
Sodium: 139 mmol/L (ref 135–145)
Total Bilirubin: 0.4 mg/dL (ref 0.3–1.2)
Total Protein: 6.9 g/dL (ref 6.5–8.1)

## 2020-01-13 LAB — URINALYSIS, ROUTINE W REFLEX MICROSCOPIC
Bilirubin Urine: NEGATIVE
Glucose, UA: NEGATIVE mg/dL
Hgb urine dipstick: NEGATIVE
Ketones, ur: NEGATIVE mg/dL
Leukocytes,Ua: NEGATIVE
Nitrite: NEGATIVE
Protein, ur: NEGATIVE mg/dL
Specific Gravity, Urine: 1.016 (ref 1.005–1.030)
pH: 5 (ref 5.0–8.0)

## 2020-01-13 LAB — SURGICAL PCR SCREEN
MRSA, PCR: NEGATIVE
Staphylococcus aureus: NEGATIVE

## 2020-01-13 LAB — PROTIME-INR
INR: 1 (ref 0.8–1.2)
Prothrombin Time: 12.7 seconds (ref 11.4–15.2)

## 2020-01-13 LAB — SARS CORONAVIRUS 2 (TAT 6-24 HRS): SARS Coronavirus 2: NEGATIVE

## 2020-01-13 NOTE — Progress Notes (Signed)
PCP - Dr. Crissie Sickles with [facility]  Chest x-ray - 01/13/20 EKG - 01/13/20 Stress Test - Yes unsure of year ECHO - Denies Cardiac Cath - Denies  Sleep Study - Denies DM - Denies  COVID TEST- 01/13/20  Anesthesia review: No  Patient denies shortness of breath, fever, cough and chest pain at PAT appointment   All instructions explained to the patient, with a verbal understanding of the material. Patient agrees to go over the instructions while at home for a better understanding. Patient also instructed to self quarantine after being tested for COVID-19. The opportunity to ask questions was provided.

## 2020-01-14 DIAGNOSIS — Z825 Family history of asthma and other chronic lower respiratory diseases: Secondary | ICD-10-CM | POA: Diagnosis not present

## 2020-01-14 DIAGNOSIS — R9389 Abnormal findings on diagnostic imaging of other specified body structures: Secondary | ICD-10-CM | POA: Diagnosis not present

## 2020-01-14 DIAGNOSIS — E039 Hypothyroidism, unspecified: Secondary | ICD-10-CM | POA: Diagnosis not present

## 2020-01-14 NOTE — Anesthesia Preprocedure Evaluation (Addendum)
Anesthesia Evaluation  Patient identified by MRN, date of birth, ID band Patient awake    Reviewed: Allergy & Precautions, H&P , NPO status , Patient's Chart, lab work & pertinent test results  Airway Mallampati: III  TM Distance: >3 FB Neck ROM: Full    Dental no notable dental hx. (+) Teeth Intact, Dental Advisory Given, Chipped,    Pulmonary neg pulmonary ROS,    Pulmonary exam normal breath sounds clear to auscultation       Cardiovascular Exercise Tolerance: Good + Peripheral Vascular Disease  negative cardio ROS   Rhythm:Regular Rate:Normal     Neuro/Psych negative neurological ROS  negative psych ROS   GI/Hepatic Neg liver ROS, hiatal hernia,   Endo/Other  Hypothyroidism   Renal/GU negative Renal ROS  negative genitourinary   Musculoskeletal   Abdominal   Peds  Hematology negative hematology ROS (+)   Anesthesia Other Findings   Reproductive/Obstetrics negative OB ROS                           Anesthesia Physical Anesthesia Plan  ASA: III  Anesthesia Plan: General   Post-op Pain Management:    Induction: Intravenous  PONV Risk Score and Plan: 4 or greater and Dexamethasone, Ondansetron, Treatment may vary due to age or medical condition and Midazolam  Airway Management Planned: Double Lumen EBT  Additional Equipment: Arterial line, CVP and Ultrasound Guidance Line Placement  Intra-op Plan:   Post-operative Plan: Extubation in OR and Possible Post-op intubation/ventilation  Informed Consent: I have reviewed the patients History and Physical, chart, labs and discussed the procedure including the risks, benefits and alternatives for the proposed anesthesia with the patient or authorized representative who has indicated his/her understanding and acceptance.     Dental advisory given  Plan Discussed with: CRNA  Anesthesia Plan Comments:         Anesthesia Quick  Evaluation

## 2020-01-15 ENCOUNTER — Inpatient Hospital Stay (HOSPITAL_COMMUNITY): Payer: Medicare PPO | Admitting: Certified Registered Nurse Anesthetist

## 2020-01-15 ENCOUNTER — Other Ambulatory Visit: Payer: Self-pay

## 2020-01-15 ENCOUNTER — Inpatient Hospital Stay (HOSPITAL_COMMUNITY)
Admission: RE | Admit: 2020-01-15 | Discharge: 2020-01-18 | DRG: 165 | Disposition: A | Discharge status: Home / Self Care | Payer: Medicare PPO | Attending: Thoracic Surgery (Cardiothoracic Vascular Surgery) | Admitting: Thoracic Surgery (Cardiothoracic Vascular Surgery)

## 2020-01-15 ENCOUNTER — Encounter (HOSPITAL_COMMUNITY)
Admission: RE | Disposition: A | Discharge status: Home / Self Care | Payer: Self-pay | Source: Home / Self Care | Attending: Thoracic Surgery (Cardiothoracic Vascular Surgery)

## 2020-01-15 ENCOUNTER — Inpatient Hospital Stay (HOSPITAL_COMMUNITY): Payer: Medicare PPO

## 2020-01-15 ENCOUNTER — Encounter (HOSPITAL_COMMUNITY): Payer: Self-pay | Admitting: Thoracic Surgery (Cardiothoracic Vascular Surgery)

## 2020-01-15 DIAGNOSIS — C342 Malignant neoplasm of middle lobe, bronchus or lung: Secondary | ICD-10-CM | POA: Diagnosis present

## 2020-01-15 DIAGNOSIS — Z825 Family history of asthma and other chronic lower respiratory diseases: Secondary | ICD-10-CM

## 2020-01-15 DIAGNOSIS — Z419 Encounter for procedure for purposes other than remedying health state, unspecified: Secondary | ICD-10-CM

## 2020-01-15 DIAGNOSIS — Z882 Allergy status to sulfonamides status: Secondary | ICD-10-CM | POA: Diagnosis not present

## 2020-01-15 DIAGNOSIS — Z96653 Presence of artificial knee joint, bilateral: Secondary | ICD-10-CM | POA: Diagnosis present

## 2020-01-15 DIAGNOSIS — Z20822 Contact with and (suspected) exposure to covid-19: Secondary | ICD-10-CM | POA: Diagnosis present

## 2020-01-15 DIAGNOSIS — Z902 Acquired absence of lung [part of]: Secondary | ICD-10-CM

## 2020-01-15 DIAGNOSIS — Z803 Family history of malignant neoplasm of breast: Secondary | ICD-10-CM | POA: Diagnosis not present

## 2020-01-15 DIAGNOSIS — Z885 Allergy status to narcotic agent status: Secondary | ICD-10-CM | POA: Diagnosis not present

## 2020-01-15 DIAGNOSIS — Z888 Allergy status to other drugs, medicaments and biological substances status: Secondary | ICD-10-CM | POA: Diagnosis not present

## 2020-01-15 DIAGNOSIS — E039 Hypothyroidism, unspecified: Secondary | ICD-10-CM | POA: Diagnosis present

## 2020-01-15 DIAGNOSIS — Z79890 Hormone replacement therapy (postmenopausal): Secondary | ICD-10-CM | POA: Diagnosis not present

## 2020-01-15 DIAGNOSIS — Z4682 Encounter for fitting and adjustment of non-vascular catheter: Secondary | ICD-10-CM

## 2020-01-15 DIAGNOSIS — C3481 Malignant neoplasm of overlapping sites of right bronchus and lung: Secondary | ICD-10-CM | POA: Diagnosis present

## 2020-01-15 DIAGNOSIS — J939 Pneumothorax, unspecified: Secondary | ICD-10-CM

## 2020-01-15 DIAGNOSIS — M1990 Unspecified osteoarthritis, unspecified site: Secondary | ICD-10-CM | POA: Diagnosis present

## 2020-01-15 DIAGNOSIS — R911 Solitary pulmonary nodule: Secondary | ICD-10-CM

## 2020-01-15 DIAGNOSIS — C3411 Malignant neoplasm of upper lobe, right bronchus or lung: Secondary | ICD-10-CM

## 2020-01-15 HISTORY — PX: INTERCOSTAL NERVE BLOCK: SHX5021

## 2020-01-15 HISTORY — PX: VIDEO BRONCHOSCOPY WITH ENDOBRONCHIAL NAVIGATION: SHX6175

## 2020-01-15 HISTORY — PX: LYMPH NODE DISSECTION: SHX5087

## 2020-01-15 LAB — POCT I-STAT 7, (LYTES, BLD GAS, ICA,H+H)
Acid-base deficit: 1 mmol/L (ref 0.0–2.0)
Bicarbonate: 28.2 mmol/L — ABNORMAL HIGH (ref 20.0–28.0)
Calcium, Ion: 1.31 mmol/L (ref 1.15–1.40)
HCT: 40 % (ref 36.0–46.0)
Hemoglobin: 13.6 g/dL (ref 12.0–15.0)
O2 Saturation: 94 %
Potassium: 4.2 mmol/L (ref 3.5–5.1)
Sodium: 140 mmol/L (ref 135–145)
TCO2: 30 mmol/L (ref 22–32)
pCO2 arterial: 66.1 mmHg (ref 32.0–48.0)
pH, Arterial: 7.238 — ABNORMAL LOW (ref 7.350–7.450)
pO2, Arterial: 86 mmHg (ref 83.0–108.0)

## 2020-01-15 LAB — ABO/RH: ABO/RH(D): A POS

## 2020-01-15 LAB — PREPARE RBC (CROSSMATCH)

## 2020-01-15 LAB — GLUCOSE, CAPILLARY: Glucose-Capillary: 131 mg/dL — ABNORMAL HIGH (ref 70–99)

## 2020-01-15 SURGERY — LOBECTOMY, LUNG, ROBOT-ASSISTED, USING VATS
Anesthesia: General | Site: Chest | Laterality: Right

## 2020-01-15 MED ORDER — HYPROMELLOSE (GONIOSCOPIC) 2.5 % OP SOLN
1.0000 [drp] | OPHTHALMIC | Status: DC | PRN
  Filled 2020-01-15: qty 15

## 2020-01-15 MED ORDER — SODIUM CHLORIDE FLUSH 0.9 % IV SOLN
INTRAVENOUS | Status: DC | PRN
  Administered 2020-01-15: 90 mL

## 2020-01-15 MED ORDER — ROCURONIUM BROMIDE 10 MG/ML (PF) SYRINGE
PREFILLED_SYRINGE | INTRAVENOUS | Status: DC | PRN
  Administered 2020-01-15: 20 mg via INTRAVENOUS
  Administered 2020-01-15: 60 mg via INTRAVENOUS
  Administered 2020-01-15: 20 mg via INTRAVENOUS

## 2020-01-15 MED ORDER — ONDANSETRON HCL 4 MG/2ML IJ SOLN
INTRAMUSCULAR | Status: DC | PRN
  Administered 2020-01-15: 4 mg via INTRAVENOUS

## 2020-01-15 MED ORDER — DEXAMETHASONE SODIUM PHOSPHATE 10 MG/ML IJ SOLN
INTRAMUSCULAR | Status: AC
  Filled 2020-01-15: qty 1

## 2020-01-15 MED ORDER — BISACODYL 5 MG PO TBEC
10.0000 mg | DELAYED_RELEASE_TABLET | Freq: Every day | ORAL | Status: DC
  Administered 2020-01-16 – 2020-01-18 (×3): 10 mg via ORAL
  Filled 2020-01-15 (×3): qty 2

## 2020-01-15 MED ORDER — PROPOFOL 10 MG/ML IV BOLUS
INTRAVENOUS | Status: AC
  Filled 2020-01-15: qty 20

## 2020-01-15 MED ORDER — IPRATROPIUM-ALBUTEROL 0.5-2.5 (3) MG/3ML IN SOLN
3.0000 mL | Freq: Four times a day (QID) | RESPIRATORY_TRACT | Status: DC
  Administered 2020-01-15: 3 mL via RESPIRATORY_TRACT
  Filled 2020-01-15: qty 3

## 2020-01-15 MED ORDER — ACETAMINOPHEN 500 MG PO TABS
1000.0000 mg | ORAL_TABLET | Freq: Once | ORAL | Status: AC
  Administered 2020-01-15: 1000 mg via ORAL
  Filled 2020-01-15: qty 2

## 2020-01-15 MED ORDER — FENTANYL CITRATE (PF) 100 MCG/2ML IJ SOLN
INTRAMUSCULAR | Status: DC | PRN
  Administered 2020-01-15: 100 ug via INTRAVENOUS
  Administered 2020-01-15: 50 ug via INTRAVENOUS
  Administered 2020-01-15: 150 ug via INTRAVENOUS
  Administered 2020-01-15 (×2): 50 ug via INTRAVENOUS
  Administered 2020-01-15: 100 ug via INTRAVENOUS

## 2020-01-15 MED ORDER — METHYLENE BLUE 0.5 % INJ SOLN
INTRAVENOUS | Status: AC
  Filled 2020-01-15: qty 10

## 2020-01-15 MED ORDER — TRAMADOL HCL 50 MG PO TABS: 50.0000 mg | ORAL_TABLET | Freq: Four times a day (QID) | ORAL | Status: DC | PRN

## 2020-01-15 MED ORDER — BUPIVACAINE LIPOSOME 1.3 % IJ SUSP
20.0000 mL | Freq: Once | INTRAMUSCULAR | Status: DC
  Filled 2020-01-15: qty 20

## 2020-01-15 MED ORDER — SODIUM CHLORIDE 0.9% IV SOLUTION: Freq: Once | INTRAVENOUS | Status: DC

## 2020-01-15 MED ORDER — SUGAMMADEX SODIUM 200 MG/2ML IV SOLN
INTRAVENOUS | Status: DC | PRN
  Administered 2020-01-15: 200 mg via INTRAVENOUS

## 2020-01-15 MED ORDER — GLYCOPYRROLATE PF 0.2 MG/ML IJ SOSY
PREFILLED_SYRINGE | INTRAMUSCULAR | Status: DC | PRN
  Administered 2020-01-15: .1 mg via INTRAVENOUS

## 2020-01-15 MED ORDER — ROCURONIUM BROMIDE 10 MG/ML (PF) SYRINGE
PREFILLED_SYRINGE | INTRAVENOUS | Status: AC
  Filled 2020-01-15: qty 10

## 2020-01-15 MED ORDER — LEVOTHYROXINE SODIUM 75 MCG PO TABS
75.0000 ug | ORAL_TABLET | ORAL | Status: DC
  Administered 2020-01-17: 75 ug via ORAL
  Filled 2020-01-15: qty 1

## 2020-01-15 MED ORDER — MIDAZOLAM HCL 2 MG/2ML IJ SOLN
INTRAMUSCULAR | Status: AC
  Filled 2020-01-15: qty 2

## 2020-01-15 MED ORDER — FENTANYL CITRATE (PF) 100 MCG/2ML IJ SOLN
INTRAMUSCULAR | Status: AC
Start: 2020-01-15 — End: 2020-01-16
  Filled 2020-01-15: qty 2

## 2020-01-15 MED ORDER — PHENYLEPHRINE HCL-NACL 10-0.9 MG/250ML-% IV SOLN
INTRAVENOUS | Status: DC | PRN
  Administered 2020-01-15: 60 ug/min via INTRAVENOUS

## 2020-01-15 MED ORDER — SODIUM CHLORIDE 0.9 % IR SOLN
Status: DC | PRN
  Administered 2020-01-15: 1000 mL

## 2020-01-15 MED ORDER — 0.9 % SODIUM CHLORIDE (POUR BTL) OPTIME
TOPICAL | Status: DC | PRN
  Administered 2020-01-15: 3000 mL

## 2020-01-15 MED ORDER — ACETAMINOPHEN 160 MG/5ML PO SOLN: 1000.0000 mg | Freq: Four times a day (QID) | ORAL | Status: DC

## 2020-01-15 MED ORDER — HEMOSTATIC AGENTS (NO CHARGE) OPTIME
TOPICAL | Status: DC | PRN
  Administered 2020-01-15: 2 via TOPICAL

## 2020-01-15 MED ORDER — ENOXAPARIN SODIUM 40 MG/0.4ML ~~LOC~~ SOLN
40.0000 mg | Freq: Every day | SUBCUTANEOUS | Status: DC
  Administered 2020-01-16 – 2020-01-18 (×3): 40 mg via SUBCUTANEOUS
  Filled 2020-01-15 (×3): qty 0.4

## 2020-01-15 MED ORDER — METOCLOPRAMIDE HCL 5 MG/ML IJ SOLN
10.0000 mg | Freq: Four times a day (QID) | INTRAMUSCULAR | Status: AC
  Administered 2020-01-15 – 2020-01-16 (×4): 10 mg via INTRAVENOUS
  Filled 2020-01-15 (×4): qty 2

## 2020-01-15 MED ORDER — LACTATED RINGERS IV SOLN: INTRAVENOUS | Status: DC | PRN

## 2020-01-15 MED ORDER — SENNOSIDES-DOCUSATE SODIUM 8.6-50 MG PO TABS
1.0000 | ORAL_TABLET | Freq: Every day | ORAL | Status: DC
  Administered 2020-01-15 – 2020-01-17 (×3): 1 via ORAL
  Filled 2020-01-15 (×3): qty 1

## 2020-01-15 MED ORDER — DEXTROSE-NACL 5-0.9 % IV SOLN: INTRAVENOUS | Status: DC

## 2020-01-15 MED ORDER — INSULIN ASPART 100 UNIT/ML ~~LOC~~ SOLN
0.0000 [IU] | SUBCUTANEOUS | Status: DC
  Administered 2020-01-15: 2 [IU] via SUBCUTANEOUS
  Administered 2020-01-16: 8 [IU] via SUBCUTANEOUS

## 2020-01-15 MED ORDER — CHLORHEXIDINE GLUCONATE 0.12 % MT SOLN
OROMUCOSAL | Status: AC
  Administered 2020-01-15: 15 mL
  Filled 2020-01-15: qty 15

## 2020-01-15 MED ORDER — ONDANSETRON HCL 4 MG/2ML IJ SOLN: 4.0000 mg | Freq: Four times a day (QID) | INTRAMUSCULAR | Status: DC | PRN

## 2020-01-15 MED ORDER — PHENYLEPHRINE 40 MCG/ML (10ML) SYRINGE FOR IV PUSH (FOR BLOOD PRESSURE SUPPORT)
PREFILLED_SYRINGE | INTRAVENOUS | Status: AC
  Filled 2020-01-15: qty 10

## 2020-01-15 MED ORDER — ACETAMINOPHEN 500 MG PO TABS
1000.0000 mg | ORAL_TABLET | Freq: Four times a day (QID) | ORAL | Status: DC
  Administered 2020-01-15 – 2020-01-18 (×10): 1000 mg via ORAL
  Filled 2020-01-15 (×12): qty 2

## 2020-01-15 MED ORDER — CEFAZOLIN SODIUM-DEXTROSE 2-4 GM/100ML-% IV SOLN
2.0000 g | Freq: Three times a day (TID) | INTRAVENOUS | Status: AC
  Administered 2020-01-15 – 2020-01-16 (×2): 2 g via INTRAVENOUS
  Filled 2020-01-15 (×2): qty 100

## 2020-01-15 MED ORDER — DEXAMETHASONE SODIUM PHOSPHATE 10 MG/ML IJ SOLN
INTRAMUSCULAR | Status: DC | PRN
  Administered 2020-01-15: 10 mg via INTRAVENOUS

## 2020-01-15 MED ORDER — METHYLENE BLUE 0.5 % INJ SOLN
INTRAVENOUS | Status: DC | PRN
  Administered 2020-01-15: 1 mL

## 2020-01-15 MED ORDER — EPHEDRINE SULFATE-NACL 50-0.9 MG/10ML-% IV SOSY
PREFILLED_SYRINGE | INTRAVENOUS | Status: DC | PRN
  Administered 2020-01-15 (×2): 5 mg via INTRAVENOUS

## 2020-01-15 MED ORDER — FENTANYL CITRATE (PF) 100 MCG/2ML IJ SOLN
25.0000 ug | INTRAMUSCULAR | Status: DC | PRN
  Administered 2020-01-15 (×4): 25 ug via INTRAVENOUS

## 2020-01-15 MED ORDER — FENTANYL CITRATE (PF) 250 MCG/5ML IJ SOLN
INTRAMUSCULAR | Status: AC
  Filled 2020-01-15: qty 5

## 2020-01-15 MED ORDER — GLYCOPYRROLATE PF 0.2 MG/ML IJ SOSY
PREFILLED_SYRINGE | INTRAMUSCULAR | Status: AC
  Filled 2020-01-15: qty 1

## 2020-01-15 MED ORDER — BUPIVACAINE HCL (PF) 0.5 % IJ SOLN
INTRAMUSCULAR | Status: AC
  Filled 2020-01-15: qty 30

## 2020-01-15 MED ORDER — EPHEDRINE 5 MG/ML INJ
INTRAVENOUS | Status: AC
  Filled 2020-01-15: qty 10

## 2020-01-15 MED ORDER — PROPOFOL 10 MG/ML IV BOLUS
INTRAVENOUS | Status: DC | PRN
  Administered 2020-01-15: 120 mg via INTRAVENOUS

## 2020-01-15 MED ORDER — LIDOCAINE 2% (20 MG/ML) 5 ML SYRINGE
INTRAMUSCULAR | Status: AC
  Filled 2020-01-15: qty 5

## 2020-01-15 MED ORDER — KETOROLAC TROMETHAMINE 15 MG/ML IJ SOLN
15.0000 mg | Freq: Four times a day (QID) | INTRAMUSCULAR | Status: DC | PRN
  Administered 2020-01-16: 15 mg via INTRAVENOUS
  Filled 2020-01-15: qty 1

## 2020-01-15 MED ORDER — CEFAZOLIN SODIUM-DEXTROSE 2-4 GM/100ML-% IV SOLN
2.0000 g | INTRAVENOUS | Status: AC
  Administered 2020-01-15: 2 g via INTRAVENOUS
  Filled 2020-01-15: qty 100

## 2020-01-15 MED ORDER — MIDAZOLAM HCL 5 MG/5ML IJ SOLN
INTRAMUSCULAR | Status: DC | PRN
  Administered 2020-01-15 (×2): 1 mg via INTRAVENOUS

## 2020-01-15 MED ORDER — LEVOTHYROXINE SODIUM 50 MCG PO TABS
50.0000 ug | ORAL_TABLET | ORAL | Status: DC
  Administered 2020-01-16 – 2020-01-18 (×2): 50 ug via ORAL
  Filled 2020-01-15 (×2): qty 1

## 2020-01-15 MED ORDER — CHLORHEXIDINE GLUCONATE CLOTH 2 % EX PADS
6.0000 | MEDICATED_PAD | Freq: Every day | CUTANEOUS | Status: DC
  Administered 2020-01-15 – 2020-01-18 (×4): 6 via TOPICAL

## 2020-01-15 MED ORDER — OXYCODONE HCL 5 MG PO TABS
5.0000 mg | ORAL_TABLET | ORAL | Status: DC | PRN
  Administered 2020-01-15 – 2020-01-18 (×6): 10 mg via ORAL
  Filled 2020-01-15 (×6): qty 2

## 2020-01-15 MED ORDER — ONDANSETRON HCL 4 MG/2ML IJ SOLN
INTRAMUSCULAR | Status: AC
  Filled 2020-01-15: qty 2

## 2020-01-15 SURGICAL SUPPLY — 145 items
ADAPTER BRONCHOSCOPE OLYMPUS (ADAPTER) ×5 IMPLANT
ADAPTER VALVE BIOPSY EBUS (MISCELLANEOUS) IMPLANT
ADPTR VALVE BIOPSY EBUS (MISCELLANEOUS)
APPLIER CLIP ROT 10 11.4 M/L (STAPLE)
BLADE CLIPPER SURG (BLADE) ×5 IMPLANT
BLADE SURG SZ11 CARB STEEL (BLADE) IMPLANT
BNDG COHESIVE 6X5 TAN STRL LF (GAUZE/BANDAGES/DRESSINGS) IMPLANT
BRUSH BIOPSY BRONCH 10 SDTNB (MISCELLANEOUS) IMPLANT
BRUSH BIOPSY BRONCH 10MM SDTNB (MISCELLANEOUS)
BRUSH SUPERTRAX BIOPSY (INSTRUMENTS) IMPLANT
BRUSH SUPERTRAX NDL-TIP CYTO (INSTRUMENTS) ×5 IMPLANT
CANISTER SUCT 3000ML PPV (MISCELLANEOUS) ×10 IMPLANT
CANNULA REDUC XI 12-8 STAPL (CANNULA) ×2
CANNULA REDUC XI 12-8MM STAPL (CANNULA) ×2
CANNULA REDUCER 12-8 DVNC XI (CANNULA) ×6 IMPLANT
CATH THORACIC 28FR (CATHETERS) IMPLANT
CATH THORACIC 28FR RT ANG (CATHETERS) IMPLANT
CATH THORACIC 36FR (CATHETERS) IMPLANT
CATH THORACIC 36FR RT ANG (CATHETERS) IMPLANT
CLIP APPLIE ROT 10 11.4 M/L (STAPLE) IMPLANT
CLIP VESOCCLUDE MED 6/CT (CLIP) IMPLANT
CNTNR URN SCR LID CUP LEK RST (MISCELLANEOUS) ×45 IMPLANT
CONN ST 1/4X3/8  BEN (MISCELLANEOUS) ×2
CONN ST 1/4X3/8 BEN (MISCELLANEOUS) ×3 IMPLANT
CONN Y 3/8X3/8X3/8  BEN (MISCELLANEOUS)
CONN Y 3/8X3/8X3/8 BEN (MISCELLANEOUS) IMPLANT
CONT SPEC 4OZ STRL OR WHT (MISCELLANEOUS) ×30
COVER BACK TABLE 60X90IN (DRAPES) ×5 IMPLANT
DEFOGGER SCOPE WARMER CLEARIFY (MISCELLANEOUS) ×5 IMPLANT
DERMABOND ADVANCED (GAUZE/BANDAGES/DRESSINGS) ×2
DERMABOND ADVANCED .7 DNX12 (GAUZE/BANDAGES/DRESSINGS) ×3 IMPLANT
DRAIN CHANNEL 28F RND 3/8 FF (WOUND CARE) ×5 IMPLANT
DRAIN CHANNEL 32F RND 10.7 FF (WOUND CARE) IMPLANT
DRAPE ARM DVNC X/XI (DISPOSABLE) ×12 IMPLANT
DRAPE COLUMN DVNC XI (DISPOSABLE) ×3 IMPLANT
DRAPE CV SPLIT W-CLR ANES SCRN (DRAPES) ×5 IMPLANT
DRAPE DA VINCI XI ARM (DISPOSABLE) ×8
DRAPE DA VINCI XI COLUMN (DISPOSABLE) ×2
DRAPE INCISE IOBAN 66X45 STRL (DRAPES) IMPLANT
DRAPE ORTHO SPLIT 77X108 STRL (DRAPES) ×2
DRAPE SURG ORHT 6 SPLT 77X108 (DRAPES) ×3 IMPLANT
DRAPE WARM FLUID 44X44 (DRAPES) ×5 IMPLANT
ELECT BLADE 6.5 EXT (BLADE) ×5 IMPLANT
ELECT REM PT RETURN 9FT ADLT (ELECTROSURGICAL) ×5
ELECTRODE REM PT RTRN 9FT ADLT (ELECTROSURGICAL) ×3 IMPLANT
FILTER STRAW FLUID ASPIR (MISCELLANEOUS) ×5 IMPLANT
FORCEPS BIOP SUPERTRX PREMAR (INSTRUMENTS) IMPLANT
GAUZE KITTNER 4X5 RF (MISCELLANEOUS) ×10 IMPLANT
GAUZE SPONGE 4X4 12PLY STRL (GAUZE/BANDAGES/DRESSINGS) ×5 IMPLANT
GLOVE BIOGEL PI IND STRL 6.5 (GLOVE) ×3 IMPLANT
GLOVE BIOGEL PI INDICATOR 6.5 (GLOVE) ×2
GLOVE SURG SIGNA 7.5 PF LTX (GLOVE) ×5 IMPLANT
GLOVE TRIUMPH SURG SIZE 7.5 (KITS) ×10 IMPLANT
GOWN STRL REUS W/ TWL LRG LVL3 (GOWN DISPOSABLE) ×9 IMPLANT
GOWN STRL REUS W/ TWL XL LVL3 (GOWN DISPOSABLE) ×9 IMPLANT
GOWN STRL REUS W/TWL 2XL LVL3 (GOWN DISPOSABLE) ×5 IMPLANT
GOWN STRL REUS W/TWL LRG LVL3 (GOWN DISPOSABLE) ×6
GOWN STRL REUS W/TWL XL LVL3 (GOWN DISPOSABLE) ×6
HEMOSTAT SURGICEL 2X14 (HEMOSTASIS) ×10 IMPLANT
IRRIGATION STRYKERFLOW (MISCELLANEOUS) ×3 IMPLANT
IRRIGATOR STRYKERFLOW (MISCELLANEOUS) ×5
KIT BASIN OR (CUSTOM PROCEDURE TRAY) ×5 IMPLANT
KIT CLEAN ENDO COMPLIANCE (KITS) ×5 IMPLANT
KIT ILLUMISITE 180 PROCEDURE (KITS) ×5 IMPLANT
KIT ILLUMISITE 90 PROCEDURE (KITS) IMPLANT
KIT SUCTION CATH 14FR (SUCTIONS) IMPLANT
KIT TURNOVER KIT B (KITS) ×5 IMPLANT
LOOP VESSEL SUPERMAXI WHITE (MISCELLANEOUS) IMPLANT
MARKER SKIN DUAL TIP RULER LAB (MISCELLANEOUS) ×5 IMPLANT
NEEDLE 18GX1X1/2 (RX/OR ONLY) (NEEDLE) ×5 IMPLANT
NEEDLE HYPO 25GX1X1/2 BEV (NEEDLE) ×5 IMPLANT
NEEDLE SPNL 22GX3.5 QUINCKE BK (NEEDLE) ×5 IMPLANT
NEEDLE SUPERTRX PREMARK BIOPSY (NEEDLE) ×5 IMPLANT
NS IRRIG 1000ML POUR BTL (IV SOLUTION) ×10 IMPLANT
OIL SILICONE PENTAX (PARTS (SERVICE/REPAIRS)) ×5 IMPLANT
PACK CHEST (CUSTOM PROCEDURE TRAY) ×5 IMPLANT
PAD ARMBOARD 7.5X6 YLW CONV (MISCELLANEOUS) ×10 IMPLANT
PATCHES PATIENT (LABEL) ×15 IMPLANT
RELOAD STAPLER 2.5X45 WHT DVNC (STAPLE) ×6 IMPLANT
RELOAD STAPLER 3.5X45 BLU DVNC (STAPLE) ×21 IMPLANT
RELOAD STAPLER 4.3X45 GRN DVNC (STAPLE) ×12 IMPLANT
SCISSORS LAP 5X35 DISP (ENDOMECHANICALS) IMPLANT
SEAL CANN UNIV 5-8 DVNC XI (MISCELLANEOUS) ×6 IMPLANT
SEAL XI 5MM-8MM UNIVERSAL (MISCELLANEOUS) ×4
SEALANT PROGEL (MISCELLANEOUS) IMPLANT
SEALANT SURG COSEAL 4ML (VASCULAR PRODUCTS) IMPLANT
SEALANT SURG COSEAL 8ML (VASCULAR PRODUCTS) IMPLANT
SET TUBE SMOKE EVAC HIGH FLOW (TUBING) ×5 IMPLANT
SHEARS HARMONIC HDI 20CM (ELECTROSURGICAL) IMPLANT
SOLUTION ELECTROLUBE (MISCELLANEOUS) ×5 IMPLANT
SPECIMEN JAR MEDIUM (MISCELLANEOUS) ×5 IMPLANT
SPONGE INTESTINAL PEANUT (DISPOSABLE) IMPLANT
SPONGE TONSIL TAPE 1 RFD (DISPOSABLE) IMPLANT
STAPLER 45 SUREFORM CVD (STAPLE) ×4
STAPLER 45 SUREFORM CVD DVNC (STAPLE) ×6 IMPLANT
STAPLER CANNULA SEAL DVNC XI (STAPLE) ×6 IMPLANT
STAPLER CANNULA SEAL XI (STAPLE) ×4
STAPLER RELOAD 2.5X45 WHITE (STAPLE) ×4
STAPLER RELOAD 2.5X45 WHT DVNC (STAPLE) ×6
STAPLER RELOAD 3.5X45 BLU DVNC (STAPLE) ×21
STAPLER RELOAD 3.5X45 BLUE (STAPLE) ×14
STAPLER RELOAD 4.3X45 GREEN (STAPLE) ×8
STAPLER RELOAD 4.3X45 GRN DVNC (STAPLE) ×12
SUT PDS AB 3-0 SH 27 (SUTURE) IMPLANT
SUT PROLENE 4 0 RB 1 (SUTURE)
SUT PROLENE 4-0 RB1 .5 CRCL 36 (SUTURE) IMPLANT
SUT SILK  1 MH (SUTURE) ×2
SUT SILK 1 MH (SUTURE) ×3 IMPLANT
SUT SILK 1 TIES 10X30 (SUTURE) ×5 IMPLANT
SUT SILK 2 0 SH (SUTURE) IMPLANT
SUT SILK 2 0SH CR/8 30 (SUTURE) IMPLANT
SUT SILK 3 0 SH 30 (SUTURE) IMPLANT
SUT SILK 3 0SH CR/8 30 (SUTURE) IMPLANT
SUT VIC AB 1 CTX 36 (SUTURE)
SUT VIC AB 1 CTX36XBRD ANBCTR (SUTURE) IMPLANT
SUT VIC AB 2-0 CTX 36 (SUTURE) IMPLANT
SUT VIC AB 3-0 MH 27 (SUTURE) IMPLANT
SUT VIC AB 3-0 X1 27 (SUTURE) ×5 IMPLANT
SUT VICRYL 0 TIES 12 18 (SUTURE) ×5 IMPLANT
SUT VICRYL 0 UR6 27IN ABS (SUTURE) ×10 IMPLANT
SUT VICRYL 2 TP 1 (SUTURE) IMPLANT
SYR 10ML LL (SYRINGE) ×5 IMPLANT
SYR 20ML ECCENTRIC (SYRINGE) ×5 IMPLANT
SYR 20ML LL LF (SYRINGE) ×5 IMPLANT
SYR 30ML LL (SYRINGE) ×5 IMPLANT
SYR 5ML LL (SYRINGE) ×5 IMPLANT
SYR BULB IRRIG 60ML STRL (SYRINGE) ×5 IMPLANT
SYSTEM RETRIEVAL ANCHOR 8 (MISCELLANEOUS) ×5 IMPLANT
SYSTEM SAHARA CHEST DRAIN ATS (WOUND CARE) ×5 IMPLANT
TAPE CLOTH 4X10 WHT NS (GAUZE/BANDAGES/DRESSINGS) ×5 IMPLANT
TIP APPLICATOR SPRAY EXTEND 16 (VASCULAR PRODUCTS) IMPLANT
TOWEL GREEN STERILE (TOWEL DISPOSABLE) ×5 IMPLANT
TOWEL GREEN STERILE FF (TOWEL DISPOSABLE) ×5 IMPLANT
TRAP SPECIMEN MUCUS 40CC (MISCELLANEOUS) IMPLANT
TRAY FOLEY MTR SLVR 16FR STAT (SET/KITS/TRAYS/PACK) ×5 IMPLANT
TRAY FOLEY W/BAG SLVR 14FR (SET/KITS/TRAYS/PACK) ×5 IMPLANT
TROCAR BLADELESS 15MM (ENDOMECHANICALS) IMPLANT
TROCAR XCEL 12X100 BLDLESS (ENDOMECHANICALS) ×5 IMPLANT
TUBE CONNECTING 20'X1/4 (TUBING) ×2
TUBE CONNECTING 20X1/4 (TUBING) ×8 IMPLANT
UNDERPAD 30X36 HEAVY ABSORB (UNDERPADS AND DIAPERS) ×5 IMPLANT
VALVE BIOPSY  SINGLE USE (MISCELLANEOUS) ×2
VALVE BIOPSY SINGLE USE (MISCELLANEOUS) ×3 IMPLANT
VALVE SUCTION BRONCHIO DISP (MISCELLANEOUS) ×5 IMPLANT
WATER STERILE IRR 1000ML POUR (IV SOLUTION) ×5 IMPLANT

## 2020-01-15 NOTE — Anesthesia Procedure Notes (Signed)
Procedure Name: Intubation Date/Time: 01/15/2020 8:46 AM Performed by: Orlie Dakin, CRNA Pre-anesthesia Checklist: Patient identified, Emergency Drugs available, Suction available and Patient being monitored Patient Re-evaluated:Patient Re-evaluated prior to induction Oxygen Delivery Method: Circle system utilized Preoxygenation: Pre-oxygenation with 100% oxygen Laryngoscope Size: 3 and Miller Grade View: Grade I Tube type: Oral Endobronchial tube: Left, EBT position confirmed by auscultation, EBT position confirmed by fiberoptic bronchoscope and Double lumen EBT and 35 Fr Number of attempts: 1 Airway Equipment and Method: Stylet and Video-laryngoscopy Placement Confirmation: ETT inserted through vocal cords under direct vision,  positive ETCO2 and breath sounds checked- equal and bilateral Tube secured with: Tape Dental Injury: Teeth and Oropharynx as per pre-operative assessment  Comments: ETT removed and Double lumen EBT placed as above.

## 2020-01-15 NOTE — Transfer of Care (Signed)
Immediate Anesthesia Transfer of Care Note  Patient: Cynthia Rhodes  Procedure(s) Performed: XI ROBOTIC ASSISTED THORASCOPY-right middle LOBECTOMY, right upper lobe WEDGE RESECTION (Right Chest) VIDEO BRONCHOSCOPY WITH ENDOBRONCHIAL NAVIGATION to mark right upper lobe nodule (N/A ) LYMPH NODE DISSECTION (N/A Chest) INTERCOSTAL NERVE BLOCK (Right Chest)  Patient Location: PACU  Anesthesia Type:General  Level of Consciousness: drowsy  Airway & Oxygen Therapy: Patient Spontanous Breathing and Patient connected to nasal cannula oxygen  Post-op Assessment: Report given to RN and Post -op Vital signs reviewed and stable  Post vital signs: Reviewed and stable  Last Vitals:  Vitals Value Taken Time  BP    Temp    Pulse 62 01/15/20 1215  Resp 18 01/15/20 1215  SpO2 100 % 01/15/20 1215  Vitals shown include unvalidated device data.  Last Pain:  Vitals:   01/15/20 0709  TempSrc:   PainSc: 0-No pain         Complications: No complications documented.

## 2020-01-15 NOTE — Brief Op Note (Addendum)
01/15/2020  12:03 PM  PATIENT:  Cynthia Rhodes  74 y.o. female  PRE-OPERATIVE DIAGNOSIS:  Right middle lobe carcinoid, right upper lobe nodule  POST-OPERATIVE DIAGNOSIS:  Right middle lobe carcinoid, right upper lobe carcinoid tumor  PROCEDURE:  Procedure(s):  VIDEO BRONCHOSCOPY WITH ENDOBRONCHIAL NAVIGATION to mark right upper lobe nodule (N/A)  XI ROBOTIC ASSISTED THORASCOPY RIGHT MIDDLE LOBECTOMY RIGHT UPPER LOBE WEDGE RESECTION LYMPH NODE DISSECTION (N/A) INTERCOSTAL NERVE BLOCK (Right)  SURGEON:  Surgeon(s) and Role:    * Melrose Nakayama, MD - Primary  PHYSICIAN ASSISTANT: Ellwood Handler PA-C  ANESTHESIA:   general  EBL:  Minimal  BLOOD ADMINISTERED:none  DRAINS:  28 [address] Drain Right Chest    LOCAL MEDICATIONS USED:  BUPIVICAINE   SPECIMEN:  Source of Specimen:  Wedge Resection RUL, RML, Lymph Nodes  DISPOSITION OF SPECIMEN:  PATHOLOGY  COUNTS:  YES  TOURNIQUET:  * No tourniquets in log *  DICTATION: done  PLAN OF CARE: Admit to inpatient   PATIENT DISPOSITION:  PACU - hemodynamically stable.   Delay start of Pharmacological VTE agent (>24hrs) due to surgical blood loss or risk of bleeding: no

## 2020-01-15 NOTE — Plan of Care (Signed)
  Problem: Education: Goal: Knowledge of General Education information will improve Description: Including pain rating scale, medication(s)/side effects and non-pharmacologic comfort measures Outcome: Progressing   Problem: Health Behavior/Discharge Planning: Goal: Ability to manage health-related needs will improve Outcome: Progressing   Problem: Clinical Measurements: Goal: Ability to maintain clinical measurements within normal limits will improve Outcome: Progressing   Problem: Clinical Measurements: Goal: Will remain free from infection Outcome: Progressing   Problem: Clinical Measurements: Goal: Diagnostic test results will improve Outcome: Progressing   Problem: Clinical Measurements: Goal: Respiratory complications will improve Outcome: Progressing   Problem: Activity: Goal: Risk for activity intolerance will decrease Outcome: Progressing   Problem: Nutrition: Goal: Adequate nutrition will be maintained Outcome: Progressing   Problem: Pain Managment: Goal: General experience of comfort will improve Outcome: Progressing   Problem: Skin Integrity: Goal: Risk for impaired skin integrity will decrease Outcome: Progressing

## 2020-01-15 NOTE — Op Note (Signed)
NAME: Cynthia Rhodes, Cynthia Rhodes MEDICAL RECORD [id number] ACCOUNT [id number] DATE OF BIRTH:04-Oct-1945 FACILITY: [facility] LOCATION: [facility] PHYSICIAN:Maymunah Stegemann Chaya Jan, MD  OPERATIVE REPORT  DATE OF PROCEDURE:  01/15/2020  PREOPERATIVE DIAGNOSIS:  Carcinoid tumor, right middle lobe, clinical stage (IB- T2 N0).  Right upper lobe lung nodule.  POSTOPERATIVE DIAGNOSIS:  Right middle lobe carcinoid tumor. Right upper lobe carcinoid tumor.  PROCEDURE:   1.  Electromagnetic navigational bronchoscopy for marking of right upper lobe lung nodule. 2.  Xi- Robotic-assisted right thoracoscopy for right middle lobectomy and right upper lobe wedge resection.  3.  Lymph node dissection.   4.  Intercostal nerve blocks levels 3 through 10.  SURGEON:  Modesto Charon, MD  ASSISTANT:  Ellwood Handler, PA  ANESTHESIA:  General.  FINDINGS:  Navigated to within a centimeter of the right upper lobe nodule.  Frozen section of right upper lobe nodule showed carcinoid tumor.  Right middle lobe bronchial margin negative for tumor.  CLINICAL NOTE:  Cynthia Rhodes is a 74 year old woman who was incidentally found to have a right middle lobe lung nodule back in May.  She was also noted to have a 7 mm nodule in the anterior portion of the right upper lobe.  On PET CT, the middle lobe  nodule had an SUV max of 4.8.  There was no hilar or mediastinal adenopathy.  Bronchoscopy was nondiagnostic.  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  She was offered a surgical resection with a plan to mark the right upper lobe  nodule and perform a wedge resection and do a right middle lobectomy for complete resection of the carcinoid tumor.  The indications, risks, benefits, and alternatives were discussed in detail with the patient.  She understood and accepted the risks and  agreed to proceed.  OPERATIVE NOTE:  Cynthia Rhodes was brought to the preoperative holding area on 01/15/2020.  Anesthesia established  central venous access and placed an arterial blood pressure monitoring line.  Intravenous antibiotics were administered.  She was taken to  the Operating Room, anesthetized and intubated.   A Foley catheter was placed.  Sequential compression devices were placed on the calves for DVT prophylaxis.  A timeout was performed.  Flexible fiberoptic bronchoscopy was performed via the endotracheal tube.  It revealed normal endobronchial anatomy with no endobronchial lesions to the level of the subsegmental bronchi.  The locatable guide for navigation was placed and the locatable guide was navigated to the lesion in the anterior portion of the  right upper lobe based on the CT planning.  The catheter had good alignment and was within 1 centimeter of the nodule.  This area was marked with 1 mL of a solution containing methylene blue and ICG.  The bronchoscope was removed.  She was reintubated with a double lumen endotracheal tube.  She was placed in a left lateral decubitus position and the right chest was prepped and draped in the usual sterile fashion.  Single-lung ventilation of the left lung was initiated.  The  right chest was prepped and draped in sterile fashion.  Another timeout was performed.  A solution containing 20 mL of liposomal bupivacaine, 30 mL of 0.5% bupivacaine and 50 mL of saline was prepared.  This was used for the local at the incision sites as well as for the intercostal nerve blocks.  An incision  was made in the 8th interspace in the midaxillary line.  An 8 mm port was inserted.  The thoracoscope was advanced into the chest.  There  was good isolation of the right lung.  Carbon dioxide was insufflated per protocol.  A 12 mm port was placed 5 cm  anterior to the camera port and a 12 mm assistant's port was placed in the 10th interspace centered between the 2 anterior ports.  A second 12 cm port was placed 5 cm posterior to the camera port.  Intercostal nerve blocks then were performed from the  3rd  to the 10th interspace.  10 mL of the bupivacaine solution was injected into a subpleural plane at each level from the 3rd to the 10th.  An 8 mm port then was placed in the 8th interspace posteriorly for the retraction arm.  The robotic instruments were  inserted with thoracoscopic visualization.  The inferior ligament was divided.  All lymph nodes that were encountered during the dissection were removed and sent as separate specimens for permanent pathology.  The pleural reflection was divided at the hilum posteriorly and level 7 nodes were  removed superiorly.  Level 10 nodes were removed.  The pleura was divided overlying the mediastinum superior to the azygos vein and level 4R nodes were removed.  The dissection was carried into the fissure.  The fissure was relatively complete at the  confluence of the fissures.  The pulmonary artery was identified at that area.  Multiple level 12 lymph nodes were present around the takeoff of the middle lobe branches.  The fissure was completed between the middle and lower lobes with a single firing  of the robotic stapler.  The Firefly device was used to identify the site of the right upper lobe nodule.  This was in the anteroinferior portion of the right upper lobe.  To begin this wedge resection, the minor fissure was divided with sequential  firings of the robotic stapler.  The wedge resection then was completed with additional firings of the stapler using both green and blue cartridges.  The specimen was placed into an endoscopic retrieval bag, removed and sent for frozen section of the  nodule, which returned showing carcinoid tumor.  The right middle lobe vein was identified.  It was encircled and divided with the vascular stapler.  The lateral upper lobe arterial branch was freed up and divided with a stapler and then the medial branch was divided from a more anterior approach.  The  right middle lobe bronchus was dissected free of surrounding tissue,  encircled and then the stapler was placed across the bronchus.  A green cartridge was used on the bronchus.  The stapler was closed.  A test inflation showed good aeration of the upper  and lower lobes.  Stapler was fired, transecting the bronchus.  It should be noted while clearing the bronchus, a level 13 node was identified, which appeared more abnormal than the other lymph nodes.  This was sent as a separate specimen for permanent pathology.  The  remainder of the minor fissure then was completed with a stapler, completing the resection.  The middle lobe was placed into an endoscopic retrieval bag, removed and sent for frozen section of the bronchial margin, which subsequently returned with no  tumor seen.  All staple lines were inspected for hemostasis.  The chest was copiously irrigated with saline.  A test inflation pressure of 30 cm of water revealed no leakage from the bronchial stump or the staple lines.  After ensuring that all sponges and the vessel loop  that had been used during the dissection were removed,  the robotic arms were  removed and the robot was undocked.  A 28-French Blake drain was placed through the anterior 8th interspace incision and secured with a #1 silk suture.  Dual-lung ventilation  was resumed.  The remaining incisions were closed with 0 Vicryl fascial sutures and 3-0 Vicryl subcuticular sutures.  All sponge, needle and instrument counts were correct at the end of the procedure.  The patient was taken from the operating room to  the [facility] unit extubated and in good condition.  VN/NUANCE  D:01/15/2020 T:01/15/2020 JOB:[id number]

## 2020-01-15 NOTE — Anesthesia Procedure Notes (Signed)
Central Venous Catheter Insertion Performed by: Nolon Nations, MD, anesthesiologist Start/End8/26/2021 7:30 AM, 01/15/2020 7:45 AM Patient location: Pre-op. Preanesthetic checklist: patient identified, IV checked, site marked, risks and benefits discussed, surgical consent, monitors and equipment checked, pre-op evaluation, timeout performed and anesthesia consent Position: Trendelenburg Lidocaine 1% used for infiltration and patient sedated Hand hygiene performed , maximum sterile barriers used  and Seldinger technique used Catheter size: 8 Fr Total catheter length 16. Central line was placed.Double lumen Procedure performed using ultrasound guided technique. Ultrasound Notes:anatomy identified, needle tip was noted to be adjacent to the nerve/plexus identified, no ultrasound evidence of intravascular and/or intraneural injection and image(s) printed for medical record Attempts: 1 Following insertion, dressing applied, line sutured and Biopatch. Post procedure assessment: blood return through all ports, free fluid flow and no air  Patient tolerated the procedure well with no immediate complications.

## 2020-01-15 NOTE — Anesthesia Procedure Notes (Signed)
Arterial Line Insertion Start/End8/26/2021 7:44 AM, 01/15/2020 7:48 AM Performed by: Orlie Dakin, CRNA, CRNA  Patient location: Pre-op. Preanesthetic checklist: patient identified, IV checked, risks and benefits discussed, surgical consent, monitors and equipment checked, pre-op evaluation and timeout performed Lidocaine 1% used for infiltration and patient sedated Left, radial was placed Catheter size: 20 G Hand hygiene performed  and maximum sterile barriers used   Attempts: 1 Procedure performed without using ultrasound guided technique. Following insertion, dressing applied and Biopatch. Post procedure assessment: normal  Patient tolerated the procedure well with no immediate complications.

## 2020-01-15 NOTE — Hospital Course (Signed)
History of Present Illness:  Ms. Cynthia Rhodes is a 74 yo female with known history of hypothyroidism, hysterectomy, degenerative joint disease, bilateral total knee replacements.  She is a lifelong non-smoker.  She recently had a CT virtual colonoscopy.  She was noted to have a 3.3 cm mass in the anterior right middle lobe of the lung.  CT of the chest showed a right middle lobe lung nodule, measuring 2.8 x 1.6 cm.  There also was a 7 mm nodule in the right upper lobe.  On PET CT the middle lobe nodule measured 3.1 x 1.5 cm.  It had an SUV max of 4.8.  There was no uptake in the right upper lobe nodule.  There was no hilar or mediastinal adenopathy.  Bronchoscopy was nondiagnostic. 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  She was referred to Dr. [name] who referred the patient to [facility] for consultation.  She was evaluated by Dr. Roxan Hockey at which time she admitted to experiencing a chronic cough.  This had been present for about 10 years and was non-productive.  She has noticed decreased energy levels but denied weight loss and appetite changes.  She can walk about a mile before she gets short of breath.  It was felt she would require surgical resection.  The risks and benefits of the procedure were explained to the patient and she was agreeable to proceed.   Hospital Course:  Ms. Cynthia Rhodes presented to [facility] on 01/15/2020.  She was taken to the operating room and underwent Video Bronchoscopy with Endobronchial Navigation with marking of right upper lobe nodule, Robotic Assisted Video Thoracoscopy with wedge resection of Right Upper Lobe Nodule, Right Middle Lobectomy, Lymph Node dissection, and Intercostal nerve block.  She tolerated the procedure without difficulty, was extubated, and taken to the PACU in stable condition.

## 2020-01-15 NOTE — Interval H&P Note (Signed)
History and Physical Interval Note:  01/15/2020 7:46 AM  Cynthia Rhodes  has presented today for surgery, with the diagnosis of Right middle lobe carcinoid, right upper lobe nodule.  The various methods of treatment have been discussed with the patient and family. After consideration of risks, benefits and other options for treatment, the patient has consented to  Procedure(s): XI ROBOTIC ASSISTED THORASCOPY-right middle LOBECTOMY, right upper lobe WEDGE RESECTION (Right) VIDEO BRONCHOSCOPY WITH ENDOBRONCHIAL NAVIGATION to mark right upper lobe nodule (N/A) as a surgical intervention.  The patient's history has been reviewed, patient examined, no change in status, stable for surgery.  I have reviewed the patient's chart and labs.  Questions were answered to the patient's satisfaction.     Melrose Nakayama

## 2020-01-15 NOTE — Anesthesia Postprocedure Evaluation (Signed)
Anesthesia Post Note  Patient: Cynthia Rhodes  Procedure(s) Performed: XI ROBOTIC ASSISTED THORASCOPY-right middle LOBECTOMY, right upper lobe WEDGE RESECTION (Right Chest) VIDEO BRONCHOSCOPY WITH ENDOBRONCHIAL NAVIGATION to mark right upper lobe nodule (N/A ) LYMPH NODE DISSECTION (N/A Chest) INTERCOSTAL NERVE BLOCK (Right Chest)     Patient location during evaluation: PACU Anesthesia Type: General Level of consciousness: awake and alert Pain management: pain level controlled Vital Signs Assessment: post-procedure vital signs reviewed and stable Respiratory status: spontaneous breathing, nonlabored ventilation, respiratory function stable and patient connected to nasal cannula oxygen Cardiovascular status: blood pressure returned to baseline and stable Postop Assessment: no apparent nausea or vomiting Anesthetic complications: no   No complications documented.  Last Vitals:  Vitals:   01/15/20 1354 01/15/20 1400  BP:  (!) 157/74  Pulse: 64 (!) 58  Resp: 16 17  Temp:    SpO2: 100% 98%    Last Pain:  Vitals:   01/15/20 1345  TempSrc:   PainSc: Asleep                 Ziquan Fidel,W. EDMOND

## 2020-01-15 NOTE — Anesthesia Procedure Notes (Signed)
Procedure Name: Intubation Date/Time: 01/15/2020 8:13 AM Performed by: Orlie Dakin, CRNA Pre-anesthesia Checklist: Patient identified, Emergency Drugs available, Suction available and Patient being monitored Patient Re-evaluated:Patient Re-evaluated prior to induction Oxygen Delivery Method: Circle system utilized Preoxygenation: Pre-oxygenation with 100% oxygen Induction Type: IV induction Ventilation: Mask ventilation without difficulty Laryngoscope Size: Miller and 3 Grade View: Grade I Tube type: Oral Tube size: 8.5 mm Number of attempts: 1 Airway Equipment and Method: Stylet Placement Confirmation: ETT inserted through vocal cords under direct vision,  positive ETCO2 and breath sounds checked- equal and bilateral Secured at: 23 cm Tube secured with: Tape Dental Injury: Teeth and Oropharynx as per pre-operative assessment

## 2020-01-16 ENCOUNTER — Inpatient Hospital Stay (HOSPITAL_COMMUNITY): Payer: Medicare PPO

## 2020-01-16 ENCOUNTER — Ambulatory Visit: Payer: Medicare PPO | Admitting: Emergency Medicine

## 2020-01-16 ENCOUNTER — Encounter (HOSPITAL_COMMUNITY): Payer: Self-pay | Admitting: Thoracic Surgery (Cardiothoracic Vascular Surgery)

## 2020-01-16 LAB — GLUCOSE, CAPILLARY
Glucose-Capillary: 221 mg/dL — ABNORMAL HIGH (ref 70–99)
Glucose-Capillary: 227 mg/dL — ABNORMAL HIGH (ref 70–99)
Glucose-Capillary: 71 mg/dL (ref 70–99)

## 2020-01-16 LAB — BASIC METABOLIC PANEL
Anion gap: 7 (ref 5–15)
BUN: 13 mg/dL (ref 8–23)
CO2: 27 mmol/L (ref 22–32)
Calcium: 8.3 mg/dL — ABNORMAL LOW (ref 8.9–10.3)
Chloride: 104 mmol/L (ref 98–111)
Creatinine, Ser: 0.72 mg/dL (ref 0.44–1.00)
GFR calc Af Amer: >60 mL/min (ref >60)
GFR calc non Af Amer: >60 mL/min (ref >60)
Glucose, Bld: 75 mg/dL (ref 70–99)
Potassium: 3.5 mmol/L (ref 3.5–5.1)
Sodium: 138 mmol/L (ref 135–145)

## 2020-01-16 LAB — CBC
HCT: 36.1 % (ref 36.0–46.0)
Hemoglobin: 11.7 g/dL — ABNORMAL LOW (ref 12.0–15.0)
MCH: 29.8 pg (ref 26.0–34.0)
MCHC: 32.4 g/dL (ref 30.0–36.0)
MCV: 92.1 fL (ref 80.0–100.0)
Platelets: 301 10*3/uL (ref 150–400)
RBC: 3.92 MIL/uL (ref 3.87–5.11)
RDW: 13.3 % (ref 11.5–15.5)
WBC: 12.6 10*3/uL — ABNORMAL HIGH (ref 4.0–10.5)
nRBC: 0 % (ref 0.0–0.2)

## 2020-01-16 MED ORDER — IPRATROPIUM-ALBUTEROL 0.5-2.5 (3) MG/3ML IN SOLN: 3.0000 mL | Freq: Three times a day (TID) | RESPIRATORY_TRACT | Status: DC

## 2020-01-16 MED ORDER — IPRATROPIUM-ALBUTEROL 0.5-2.5 (3) MG/3ML IN SOLN: 3.0000 mL | Freq: Four times a day (QID) | RESPIRATORY_TRACT | Status: DC | PRN

## 2020-01-16 NOTE — Progress Notes (Signed)
Foley  cath removed  By NT tolerated well.

## 2020-01-16 NOTE — Plan of Care (Signed)
  Problem: Education: Goal: Knowledge of General Education information will improve Description: Including pain rating scale, medication(s)/side effects and non-pharmacologic comfort measures Outcome: Progressing   Problem: Health Behavior/Discharge Planning: Goal: Ability to manage health-related needs will improve Outcome: Progressing   Problem: Clinical Measurements: Goal: Ability to maintain clinical measurements within normal limits will improve Outcome: Progressing   Problem: Clinical Measurements: Goal: Will remain free from infection Outcome: Progressing   Problem: Clinical Measurements: Goal: Diagnostic test results will improve Outcome: Progressing   Problem: Clinical Measurements: Goal: Respiratory complications will improve Outcome: Progressing   Problem: Activity: Goal: Risk for activity intolerance will decrease Outcome: Progressing   Problem: Nutrition: Goal: Adequate nutrition will be maintained Outcome: Progressing   Problem: Coping: Goal: Level of anxiety will decrease Outcome: Progressing   Problem: Pain Managment: Goal: General experience of comfort will improve Outcome: Progressing   Problem: Safety: Goal: Ability to remain free from injury will improve Outcome: Progressing   Problem: Skin Integrity: Goal: Risk for impaired skin integrity will decrease Outcome: Progressing

## 2020-01-16 NOTE — Progress Notes (Addendum)
[address]       [place] [address]             [phone]      1 Day Post-Op Procedure(s) (LRB): XI ROBOTIC ASSISTED THORASCOPY-right middle LOBECTOMY, right upper lobe WEDGE RESECTION (Right) VIDEO BRONCHOSCOPY WITH ENDOBRONCHIAL NAVIGATION to mark right upper lobe nodule (N/A) LYMPH NODE DISSECTION (N/A) INTERCOSTAL NERVE BLOCK (Right) Subjective: She feels okay this morning other than some incisional soreness which is controlled mostly with tylenol.   Objective: Vital signs in last 24 hours: Temp:  [97 F (36.1 C)-98 F (36.7 C)] 98 F (36.7 C) (08/27 0356) Pulse Rate:  [52-79] 68 (08/27 0356) Cardiac Rhythm: Normal sinus rhythm (08/27 0356) Resp:  [9-27] 17 (08/27 0356) BP: (109-172)/(49-105) 109/76 (08/27 0356) SpO2:  [92 %-100 %] 95 % (08/27 0356) Arterial Line BP: (151-187)/(65-87) 152/67 (08/26 1745)     Intake/Output from previous day: 08/26 0701 - 08/27 0700 In: 3329.6 [P.O.:720; I.V.:2109.6; IV Piggyback:200] Out: 2135 [Urine:1965; Chest Tube:170] Intake/Output this shift: No intake/output data recorded.  General appearance: alert, cooperative and no distress Heart: regular rate and rhythm, S1, S2 normal, no murmur, click, rub or gallop Lungs: clear to auscultation bilaterally Abdomen: soft, non-tender; bowel sounds normal; no masses,  no organomegaly Extremities: extremities normal, atraumatic, no cyanosis or edema Wound: clean and dry  Lab Results: Recent Labs    01/13/20 1043 01/13/20 1043 01/15/20 1050 01/16/20 0300  WBC 7.9  --   --  12.6*  HGB 13.5   < > 13.6 11.7*  HCT 42.4   < > 40.0 36.1  PLT 348  --   --  301   < > = values in this interval not displayed.   BMET:  Recent Labs    01/13/20 1043 01/13/20 1043 01/15/20 1050 01/16/20 0300  NA 139   < > 140 138  K 4.2   < > 4.2 3.5  CL 106  --   --  104  CO2 22  --   --  27  GLUCOSE 102*  --   --  75  BUN 17  --   --  13  CREATININE 0.71  --   --  0.72   CALCIUM 9.6  --   --  8.3*   < > = values in this interval not displayed.    PT/INR:  Recent Labs    01/13/20 1043  LABPROT 12.7  INR 1.0   ABG    Component Value Date/Time   PHART 7.238 (L) 01/15/2020 1050   HCO3 28.2 (H) 01/15/2020 1050   TCO2 30 01/15/2020 1050   ACIDBASEDEF 1.0 01/15/2020 1050   O2SAT 94.0 01/15/2020 1050   CBG (last 3)  Recent Labs    01/15/20 1954 01/15/20 2355 01/16/20 0357  GLUCAP 131* 221* 71    Assessment/Plan: S/P Procedure(s) (LRB): XI ROBOTIC ASSISTED THORASCOPY-right middle LOBECTOMY, right upper lobe WEDGE RESECTION (Right) VIDEO BRONCHOSCOPY WITH ENDOBRONCHIAL NAVIGATION to mark right upper lobe nodule (N/A) LYMPH NODE DISSECTION (N/A) INTERCOSTAL NERVE BLOCK (Right)  1. Pulm- CXR reviewed and right chest tube in good position with no visible pneumo. Only 170cc out of the chest tube. She is tolerating room air with good oxygen saturation 2. CV- BP well controlled, NSR in the 60s. 3. Renal-creatinine 0.72, electrolytes okay 4. H and H 11.7/36.1, stable 5. Blood glucose well controlled 6. Continue lovenox for DVT prophylaxis  Plan: Discontinue foley catheter and IV fluids. Chest tube  without air leak this morning and CXR is stable without pneumo. May be able to discontinue chest tube today. She is using her incentive spirometer. Pain is well controlled.    LOS: 1 day    Elgie Collard 01/16/2020 Patient seen and examined, agree with above. Looks great Will leave CT in place today, should be able to come out tomorrow [place]. Roxan Hockey, MD [facility] [phone]

## 2020-01-16 NOTE — Discharge Summary (Addendum)
[address]       [place] [address]             [phone]      Physician Discharge Summary  Patient ID: Cynthia Rhodes MRN: [id number] DOB/AGE: 75-Feb-1947 75 y.o.  Admit date: 01/15/2020 Discharge date: 01/18/2020  Admission Diagnoses:  Patient Active Problem List   Diagnosis Date Noted  . 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  She is a lifelong non-smoker.  She recently had a virtual colonoscopy.  She was noted to have a right lung opacity.  CT of the chest showed a right middle lobe lung nodule, measuring 2.8 x 1.6 cm.  There also was a 7 mm nodule in the right upper lobe.  On PET CT the middle lobe nodule measured 3.1 x 1.5 cm.  It had an SUV max of 4.8.  There was no uptake in the right upper lobe nodule.  There was no hilar or mediastinal adenopathy.  Bronchoscopy was nondiagnostic.  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  She was referred to Dr. [name] and now is referred here for consideration for surgical resection.  She has a chronic cough dating back almost 10 years.  It is nonproductive.  She has had decreased energy but no change in appetite or weight loss.  She gets short of breath after walking about a mile.  She does have arthritis.  She does bruise easily.  She has not had any unusual headaches or visual changes.  Hospital Course:  As dictated by Nicholes Rough PA-C: Cynthia Rhodes is a 74 year old female patient who underwent electromagnetic navigational  bronchoscopy for marking the right upper lobe lung nodule, robotic assisted right thoracoscopy for right middle lobectomy and right upper lobe wedge resection with Dr. Roxan Hockey.  She tolerated the procedure well was transferred to the cardiac telemetry floor for further care.  Postop day 1 she had some minor incisional pain.  She did not have an air leak and had minimal drainage from the chest tube.  Her chest x-ray showed no pneumothorax and was stable.  We discontinued her IV fluids and foley catheter.  Addendum: Chest tube was on water seal, did not have an air leak, chest x ray remained stable, and chest tube was removed on 08/28. Central line was removed on 08/28.  Portable CXR done after chest tube removal showed no pneumothorax. PA/LAT CXR done 08/29 showed trace right apical pneumothorax (CXR reviewed with Dr. Servando Snare and questionable if trace right apical pneumothorax is present). Patient has been hypertensive post op. She does have incisional pain, which may be contributing. She was instructed to follow up with her medical doctor after discharge for further evaluation. She has been tolerating a diet. She is ambulating on room air with good oxygenation. Her wounds are clean, dry, and healing without signs of infection. As discussed with Dr. Servando Snare, she is surgically stable for discharge today.  Consults: None  Significant Diagnostic Studies:   CLINICAL DATA:  Post RIGHT middle lobectomy and RIGHT upper lobe wedge resection.  EXAM: PORTABLE  CHEST 1 VIEW  COMPARISON:  A 11/09/2019  FINDINGS: Cardiomediastinal contours are stable. Fullness of the RIGHT hilum following partial lung resection changes in the RIGHT chest.  RIGHT IJ central venous catheter in stable position also with RIGHT-sided chest tube in similar position to the prior exam.  Tiny residual apical pneumothorax appears diminished since the previous study.  Lung volumes are slightly decreased compared to the  prior exam with LEFT basilar airspace disease.  Small amount of subcutaneous emphysema has developed along the RIGHT chest wall along the course of the RIGHT-sided chest tube as it enters the chest wall.  On limited assessment skeletal structures without acute process.  IMPRESSION: 1. Interval development of small amount of subcutaneous emphysema along the RIGHT chest wall along the course of the RIGHT chest tube. 2. Tiny residual apical pneumothorax appears diminished since the previous study. 3. Interval development of linear LEFT basilar airspace disease likely atelectasis. Attention on follow-up.   Electronically Signed   By: Zetta Bills M.D.   On: 01/16/2020 07:53  Treatments:  NAME: Cynthia, Rhodes MEDICAL RECORD [id number] ACCOUNT [id number] DATE OF BIRTH:[birth year] FACILITY: [facility] LOCATION: [facility] PHYSICIAN:Kailen Name C. Shabreka Coulon, MD  OPERATIVE REPORT  DATE OF PROCEDURE:  01/15/2020  PREOPERATIVE DIAGNOSIS:  Carcinoid tumor, right middle lobe, clinical stage (IB T2 N0), right upper lobe lung nodule.  POSTOPERATIVE DIAGNOSIS:  Right middle lobe carcinoid tumor, right upper lobe carcinoid tumor.  PROCEDURE:   1.  Electromagnetic navigational bronchoscopy for marking of right upper lobe lung nodule. 2.  robotic-assisted right thoracoscopy for right middle lobectomy and right upper lobe wedge resection.  3.  Lymph node dissection.   4.  Intercostal nerve blocks at levels 3 through 10.  SURGEON:  Modesto Charon, MD  ASSISTANT:  Ellwood Handler, PA  ANESTHESIA:  General.  FINDINGS:  Navigated within a centimeter of the right upper lobe nodule.  Frozen section of right upper lobe nodule showed carcinoid tumor.  Right middle lobe bronchial margin negative for tumor.   Discharge Exam: Blood pressure (!) 152/85, pulse 72, temperature 98 F (36.7 C), temperature source Oral, resp. rate 19, height 5\' 6"  (1.676 m), weight 70.8 kg, SpO2 94 %.   Cardiovascular: RRR Pulmonary: Clear to auscultation bilaterally Abdomen: Soft, non tender, bowel sounds present. Extremities: Trace bilateral lower extremity edema. Wounds: Clean and dry.  No erythema or signs of infection.    Disposition: Discharge disposition: 01-Home or Self Care        Allergies as of 01/18/2020      Reactions   Codeine Nausea Only, Other (See Comments)   Severe headaches    Drug [tape] Hives   Sulfa Antibiotics Nausea And Vomiting      Medication List    TAKE these medications   acetaminophen 650 MG CR tablet Commonly known as: TYLENOL Take 1,300 mg by mouth every 8 (eight) hours as needed for pain.   CALCIUM + D3 PO Take 1 tablet by mouth daily.   HAIR/SKIN/NAILS/BIOTIN PO Take 1 tablet by mouth daily.   OCUVITE EYE HEALTH FORMULA PO Take 1 tablet by mouth daily.   levothyroxine 50 MCG tablet Commonly known as: SYNTHROID Take 50 mcg by mouth every other day. Alternating with 75 mcg   levothyroxine 75 MCG tablet Commonly known as: SYNTHROID Take 75 mcg by mouth every other day. Alternating with 50 mcg   magnesium oxide 400 MG tablet Commonly known as: MAG-OX Take 400 mg by mouth daily.   OVER THE COUNTER MEDICATION Take 6  capsules by mouth daily. Balance of Nature Fruits & Veggies supplement (3 Veggies + 3 Fruit)   oxyCODONE 5 MG immediate release tablet Commonly known as: Oxy IR/ROXICODONE Take 1 tablet (5 mg total) by mouth every 4 (four) hours as needed for severe pain.   Systane Balance 0.6 % Soln Generic drug: Propylene Glycol Place 1 drop into both eyes See admin instructions. Once to twice daily   TURMERIC PO Take 1,000 mg by mouth daily.   Xolair 150 MG/ML prefilled syringe Generic drug: omalizumab Inject 150 mg into the skin every 28 (twenty-eight) days.       Follow-up Information    Melrose Nakayama, MD. Go on 01/27/2020.   Specialty: Cardiothoracic Surgery Why: PA/LAT CXR to be taken (at [facility] which is in the same building as Dr. Leonarda Salon office) on 09/07 at 11:15 am; Appointment is at 11:45 am Contact information: [place] [address] [phone]        [name], DO. Call.   Specialty: Family Medicine Why: for a follow up regarding high blood pressure after surgery Contact information: [address] [place] [address] [phone]               Signed: Nani Skillern PA-C 01/18/2020, 10:40 AM

## 2020-01-17 ENCOUNTER — Inpatient Hospital Stay (HOSPITAL_COMMUNITY): Payer: Medicare PPO

## 2020-01-17 LAB — COMPREHENSIVE METABOLIC PANEL
ALT: 56 U/L — ABNORMAL HIGH (ref 0–44)
AST: 75 U/L — ABNORMAL HIGH (ref 15–41)
Albumin: 2.9 g/dL — ABNORMAL LOW (ref 3.5–5.0)
Alkaline Phosphatase: 84 U/L (ref 38–126)
Anion gap: 9 (ref 5–15)
BUN: 9 mg/dL (ref 8–23)
CO2: 28 mmol/L (ref 22–32)
Calcium: 8.4 mg/dL — ABNORMAL LOW (ref 8.9–10.3)
Chloride: 100 mmol/L (ref 98–111)
Creatinine, Ser: 0.7 mg/dL (ref 0.44–1.00)
GFR calc Af Amer: >60 mL/min (ref >60)
GFR calc non Af Amer: >60 mL/min (ref >60)
Glucose, Bld: 104 mg/dL — ABNORMAL HIGH (ref 70–99)
Potassium: 3.7 mmol/L (ref 3.5–5.1)
Sodium: 137 mmol/L (ref 135–145)
Total Bilirubin: 0.6 mg/dL (ref 0.3–1.2)
Total Protein: 5.7 g/dL — ABNORMAL LOW (ref 6.5–8.1)

## 2020-01-17 LAB — CBC
HCT: 37.3 % (ref 36.0–46.0)
Hemoglobin: 12.2 g/dL (ref 12.0–15.0)
MCH: 30.3 pg (ref 26.0–34.0)
MCHC: 32.7 g/dL (ref 30.0–36.0)
MCV: 92.8 fL (ref 80.0–100.0)
Platelets: 281 10*3/uL (ref 150–400)
RBC: 4.02 MIL/uL (ref 3.87–5.11)
RDW: 13.3 % (ref 11.5–15.5)
WBC: 8.1 10*3/uL (ref 4.0–10.5)
nRBC: 0 % (ref 0.0–0.2)

## 2020-01-17 NOTE — Progress Notes (Addendum)
      [address]       [place] [address]             [phone]       2 Days Post-Op Procedure(s) (LRB): XI ROBOTIC ASSISTED THORASCOPY-right middle LOBECTOMY, right upper lobe WEDGE RESECTION (Right) VIDEO BRONCHOSCOPY WITH ENDOBRONCHIAL NAVIGATION to mark right upper lobe nodule (N/A) LYMPH NODE DISSECTION (N/A) INTERCOSTAL NERVE BLOCK (Right)  Subjective: Patient sitting in chair. She does not have much appetite, but denies abdominal pain or nausea. She is already having a better morning than yesterday.  Objective: Vital signs in last 24 hours: Temp:  [97.8 F (36.6 C)-98.4 F (36.9 C)] 98.1 F (36.7 C) (08/28 0700) Pulse Rate:  [67-70] 68 (08/28 0700) Cardiac Rhythm: Normal sinus rhythm (08/28 0737) Resp:  [18-20] 18 (08/28 0700) BP: (135-166)/(76-84) 136/80 (08/28 0700) SpO2:  [95 %-99 %] 99 % (08/28 0700)     Intake/Output from previous day: 08/27 0701 - 08/28 0700 In: 195 [P.O.:120; I.V.:75] Out: 510 [Urine:250; Chest Tube:260]   Physical Exam:  Cardiovascular: RRR Pulmonary: Clear to auscultation bilaterally Abdomen: Soft, non tender, bowel sounds present. Extremities: Trace bilateral lower extremity edema. Wounds: Clean and dry.  No erythema or signs of infection. Chest Tube: to water seal, no air leak  Lab Results: CBC: Recent Labs    01/16/20 0300 01/17/20 0325  WBC 12.6* 8.1  HGB 11.7* 12.2  HCT 36.1 37.3  PLT 301 281   BMET:  Recent Labs    01/16/20 0300 01/17/20 0325  NA 138 137  K 3.5 3.7  CL 104 100  CO2 27 28  GLUCOSE 75 104*  BUN 13 9  CREATININE 0.72 0.70  CALCIUM 8.3* 8.4*    PT/INR: No results for input(s): LABPROT, INR in the last 72 hours. ABG:  INR: Will add last result for INR, ABG once components are confirmed Will add last 4 CBG results once components are confirmed  Assessment/Plan:  1. CV - SR with HR in the 60's this am. 2.  Pulmonary - On room air. Chest tube with 260 cc of output last  24 hours. Chest tube to water seal and there is no air leak. CXR this am appears stable (trace right apical pneumothorax, mild lateral subcutaneous emphysema). Will monitor chest tube output and if low, will remove chest tube today. Await final pathology. Encourage incentive spirometer. 3. Supplement potassium 4. Remove central line  Cynthia M ZimmermanPA-C 01/17/2020,9:55 AM [phone]  Chest tube out today if output decreases, poss home tomorrow I have seen and examined Cynthia Rhodes and agree with the above assessment  and plan.  Grace Isaac MD Beeper [phone] Office [phone] 01/17/2020 1:21 PM

## 2020-01-17 NOTE — Discharge Instructions (Signed)
Robot-Assisted Thoracic Surgery, Care After This sheet gives you information about how to care for yourself after your procedure. Your health care provider may also give you more specific instructions. If you have problems or questions, contact your health care provider. What can I expect after the procedure? After the procedure, it is common to have:  Some pain and aches in the area of your surgical cuts (incisions).  Pain when breathing in (inhaling) and coughing.  Tiredness (fatigue).  Trouble sleeping.  Constipation. Follow these instructions at home: Medicines  Take over-the-counter and prescription medicines only as told by your health care provider.  If you were prescribed an antibiotic medicine, take it as told by your health care provider. Do not stop taking the antibiotic even if you start to feel better.  Talk with your health care provider about safe and effective ways to manage pain after your procedure. Pain management should fit your specific health needs.  Take prescription pain medicine before pain becomes severe. Relieving and controlling your pain will make breathing easier for you. Activity  Return to your normal activities as told by your health care provider. Ask your health care provider what activities are safe for you.  Do not lift anything that is heavier than 10 lb (4.5 kg), or the limit that you are told, until your health care provider says that it is safe.  Avoid sitting for a long time without moving. Get up and move around one or more times every few hours. Bathing  Do not take baths, swim, or use a hot tub until your health care provider approves. You may take showers. Incision care  Follow instructions from your health care provider about how to take care of your incision(s). Make sure you: ? Wash your hands with soap and water before you change your bandage (dressing). If soap and water are not available, use hand sanitizer. ? Change your  dressing as told by your health care provider. ? Leave stitches (sutures), skin glue, or adhesive strips in place. These skin closures may need to stay in place for 2 weeks or longer. If adhesive strip edges start to loosen and curl up, you may trim the loose edges. Do not remove adhesive strips completely unless your health care provider tells you to do that.  Check your incision area every day for signs of infection. Check for: ? Redness, swelling, or pain. ? Fluid or blood. ? Warmth. ? Pus or a bad smell. Driving  Ask your health care provider when it is safe for you to drive.  Do not drive or use heavy machinery while taking prescription pain medicine. Eating and drinking  Follow instructions from your health care provider about eating or drinking restrictions. These will vary depending on what procedure you had. Your health care provider may recommend: ? A liquid diet or soft diet for the first few days. ? Meals that are smaller and more frequent. ? A diet of fruits, vegetables, whole grains, and low-fat proteins. ? Limiting foods that are high in processed sugar and fat, including fried and sweet foods. Pneumonia prevention   Do not use any products that contain nicotine or tobacco, such as cigarettes and e-cigarettes. If you need help quitting, ask your health care provider.  Avoid secondhand smoke.  Do deep breathing exercises and cough regularly as directed. This helps to clear mucus and prevent pneumonia. If it hurts to cough, try one of these methods to ease your pain when you cough: ? Hold a  pillow against your chest. ? Place the palms of both hands over your incisions (use splinting).  Use an incentive spirometer as directed. This device measures how much air your lungs are getting with each breath. Using this will improve your breathing.  Do pulmonary rehabilitation as directed. This is a program that includes exercise, education, and support. General  instructions  Wear compression stockings as told by your health care provider. These stockings help to prevent blood clots and reduce swelling in your legs.  If you have a drainage tube: ? Follow instructions from your health care provider about how to take care of it. ? Do not travel by airplane after your tube is removed until your health care provider tells you it is safe.  To prevent or treat constipation while you are taking prescription pain medicine, your health care provider may recommend that you: ? Drink enough fluid to keep your urine pale yellow. ? Take over-the-counter or prescription medicines. ? Eat foods that are high in fiber, such as fresh fruits and vegetables, whole grains, and beans. ? Limit foods that are high in fat and processed sugars, such as fried and sweet foods.  Keep all follow-up visits as told by your health care provider. This is important. Contact a health care provider if:  You have redness, swelling, or pain around an incision.  You have fluid or blood coming from an incision.  An incision feels warm to the touch.  You have pus or a bad smell coming from an incision.  You have a fever.  You cannot eat or drink without vomiting.  Your prescription pain medicine is not controlling your pain. Get help right away if:  You have chest pain.  Your heart is beating quickly.  You have trouble breathing.  You have trouble speaking.  You are confused.  You feel weak or dizzy, or you faint. These symptoms may represent a serious problem that is an emergency. Do not wait to see if the symptoms will go away. Get medical help right away. Call your local emergency services (911 in the [place] Do not drive yourself to the hospital. Summary  Talk with your health care provider about safe and effective ways to manage pain after your procedure. Pain management should fit your specific health needs.  Return to your normal activities as told by your health  care provider. Ask your health care provider what activities are safe for you.  Do deep breathing exercises and cough regularly as directed. This helps to clear mucus and prevent pneumonia. If it hurts to cough, ease pain by holding a pillow against your chest or by placing the palms of both hands over your incisions (splinting). This information is not intended to replace advice given to you by your health care provider. Make sure you discuss any questions you have with your health care provider. Document Revised: 03/07/2019 Document Reviewed: 09/11/2016 Elsevier Patient Education  [place].

## 2020-01-18 ENCOUNTER — Inpatient Hospital Stay (HOSPITAL_COMMUNITY): Payer: Medicare PPO

## 2020-01-18 MED ORDER — OXYCODONE HCL 5 MG PO TABS
5.0000 mg | ORAL_TABLET | ORAL | 0 refills | Status: DC | PRN
Start: 2020-01-18 — End: 2020-03-01

## 2020-01-18 NOTE — Plan of Care (Signed)

## 2020-01-18 NOTE — Progress Notes (Addendum)
      [address]       [place] [address]             [phone]       3 Days Post-Op Procedure(s) (LRB): XI ROBOTIC ASSISTED THORASCOPY-right middle LOBECTOMY, right upper lobe WEDGE RESECTION (Right) VIDEO BRONCHOSCOPY WITH ENDOBRONCHIAL NAVIGATION to mark right upper lobe nodule (N/A) LYMPH NODE DISSECTION (N/A) INTERCOSTAL NERVE BLOCK (Right)  Subjective: Patient sitting in chair. She has no specific complaint this am.  Objective: Vital signs in last 24 hours: Temp:  [97.9 F (36.6 C)-98.4 F (36.9 C)] 98 F (36.7 C) (08/29 0712) Pulse Rate:  [68-75] 72 (08/29 0712) Cardiac Rhythm: Normal sinus rhythm (08/29 0746) Resp:  [16-20] 19 (08/29 0712) BP: (125-167)/(71-95) 152/85 (08/29 0712) SpO2:  [92 %-98 %] 94 % (08/29 0712)     Intake/Output from previous day: 08/28 0701 - 08/29 0700 In: 720 [P.O.:720] Out: 1180 [Urine:1100; Chest Tube:80]   Physical Exam:  Cardiovascular: RRR Pulmonary: Clear to auscultation bilaterally Abdomen: Soft, non tender, bowel sounds present. Extremities: Trace bilateral lower extremity edema. Wounds: Clean and dry.  No erythema or signs of infection.   Lab Results: CBC: Recent Labs    01/16/20 0300 01/17/20 0325  WBC 12.6* 8.1  HGB 11.7* 12.2  HCT 36.1 37.3  PLT 301 281   BMET:  Recent Labs    01/16/20 0300 01/17/20 0325  NA 138 137  K 3.5 3.7  CL 104 100  CO2 27 28  GLUCOSE 75 104*  BUN 13 9  CREATININE 0.72 0.70  CALCIUM 8.3* 8.4*    PT/INR: No results for input(s): LABPROT, INR in the last 72 hours. ABG:  INR: Will add last result for INR, ABG once components are confirmed Will add last 4 CBG results once components are confirmed  Assessment/Plan:  1. CV - SR with HR in the 60's this am. She is hypertensive. Will have her follow up with her medical doctor after discharge as pain is contributing. 2.  Pulmonary - On room air. Chest tube removed yesterday afternoon. CXR this am shows a  tiny right apical pneumothorax (CXR reviewed with Dr. Servando Snare and questionable if trace right apical pneumothorax is present). Encourage incentive spirometer. 3. Hypothyroidism-on Levothyroxine 4. Discharge  Cynthia Ink ZimmermanPA-C 01/18/2020,9:31 AM [phone]  Chest X-ray reviewed - no clear ptx vs "trace" plateint feels well, d/c home today I have seen and examined Cynthia Rhodes and agree with the above assessment  and plan.  Grace Isaac MD Beeper [phone] Office [phone] 01/18/2020 12:24 PM

## 2020-01-18 NOTE — Progress Notes (Signed)
Pt got discharged to home, discharge instructions provided and patient showed understanding to it, IV taken out,Telemonitor DC,pt left unit in wheelchair with all of the belongings accompanied with a family member (Significant other)  Palma Holter, Therapist, sports

## 2020-01-19 ENCOUNTER — Encounter: Payer: Self-pay | Admitting: Thoracic Surgery (Cardiothoracic Vascular Surgery)

## 2020-01-19 LAB — BPAM RBC
Blood Product Expiration Date: 202109222359
Blood Product Expiration Date: 202109222359
ISSUE DATE / TIME: 202108261048
ISSUE DATE / TIME: 202108261048
Unit Type and Rh: 6200
Unit Type and Rh: 6200

## 2020-01-19 LAB — TYPE AND SCREEN
ABO/RH(D): A POS
Antibody Screen: NEGATIVE
Unit division: 0
Unit division: 0

## 2020-01-23 ENCOUNTER — Other Ambulatory Visit: Payer: Self-pay | Admitting: Thoracic Surgery (Cardiothoracic Vascular Surgery)

## 2020-01-23 DIAGNOSIS — R918 Other nonspecific abnormal finding of lung field: Secondary | ICD-10-CM

## 2020-01-23 LAB — SURGICAL PATHOLOGY

## 2020-01-27 ENCOUNTER — Ambulatory Visit
Admission: RE | Admit: 2020-01-27 | Discharge: 2020-01-27 | Disposition: A | Discharge status: Home / Self Care | Payer: Medicare PPO | Source: Ambulatory Visit | Attending: Thoracic Surgery (Cardiothoracic Vascular Surgery) | Admitting: Thoracic Surgery (Cardiothoracic Vascular Surgery)

## 2020-01-27 ENCOUNTER — Encounter: Payer: Self-pay | Admitting: *Deleted

## 2020-01-27 ENCOUNTER — Other Ambulatory Visit: Payer: Self-pay

## 2020-01-27 ENCOUNTER — Telehealth: Payer: Self-pay | Admitting: Internal Medicine

## 2020-01-27 ENCOUNTER — Ambulatory Visit (INDEPENDENT_AMBULATORY_CARE_PROVIDER_SITE_OTHER): Payer: Self-pay | Admitting: Thoracic Surgery (Cardiothoracic Vascular Surgery)

## 2020-01-27 ENCOUNTER — Encounter: Payer: Self-pay | Admitting: Thoracic Surgery (Cardiothoracic Vascular Surgery)

## 2020-01-27 VITALS — BP 150/80 | HR 75 | Temp 97.6°F | Resp 20 | Ht 66.0 in | Wt 155.0 lb

## 2020-01-27 DIAGNOSIS — R05 Cough: Secondary | ICD-10-CM | POA: Diagnosis not present

## 2020-01-27 DIAGNOSIS — R0602 Shortness of breath: Secondary | ICD-10-CM | POA: Diagnosis not present

## 2020-01-27 DIAGNOSIS — R918 Other nonspecific abnormal finding of lung field: Secondary | ICD-10-CM

## 2020-01-27 DIAGNOSIS — Z09 Encounter for follow-up examination after completed treatment for conditions other than malignant neoplasm: Secondary | ICD-10-CM

## 2020-01-27 DIAGNOSIS — D3A090 Benign carcinoid tumor of the bronchus and lung: Secondary | ICD-10-CM

## 2020-01-27 NOTE — Progress Notes (Signed)
Per Dr. Roxan Hockey, he would like patient to be seen with Dr. [name] in 2 weeks. I notified scheduling team to call and schedule on 02/10/20 with labs

## 2020-01-27 NOTE — Progress Notes (Signed)
[address]       [place] [address]             [phone]      HPI: Mrs. Yohe returns for scheduled follow-up visit  Nashalie "Cynthia Rhodes" Neyens is a 74 year old non-smoker who was found to have a right middle lobe lung nodule on a CT done as part of a virtual colonoscopy.  On CT of the chest there also was a small right upper lobe nodule.  On PET CT the middle lobe nodule was hypermetabolic.  Biopsy showed well-differentiated carcinoid tumor.  I did a navigational bronchoscopy to mark the right upper lobe nodule followed by a robotic right middle lobectomy and upper lobe wedge resection on 01/15/2020.  Postoperatively she did well and went home on day 3.  Since discharge she has continued to feel well.  She still has a cough.  She has not had any fevers or chills.  She takes an oxycodone tablet occasionally "after I do too much."  She is happy with her progress.  Past Medical History:  Diagnosis Date  . Cancer ([place]) 2021  . History of hiatal hernia   . Hypothyroidism   . Peripheral vascular disease ([place])    varicose veins    Current Outpatient Medications  Medication Sig Dispense Refill  . acetaminophen (TYLENOL) 650 MG CR tablet Take 1,300 mg by mouth every 8 (eight) hours as needed for pain.    . Calcium Carb-Cholecalciferol (CALCIUM + D3 PO) Take 1 tablet by mouth daily.     [name] levothyroxine (SYNTHROID) 50 MCG tablet Take 50 mcg by mouth every other day. Alternating with 75 mcg    . levothyroxine (SYNTHROID) 75 MCG tablet Take 75 mcg by mouth every other day. Alternating with 50 mcg    . magnesium oxide (MAG-OX) 400 MG tablet Take 400 mg by mouth daily.    . Multiple Vitamins-Minerals (OCUVITE EYE HEALTH FORMULA PO) Take 1 tablet by mouth daily.    [name] omalizumab Arvid Right) 150 MG/ML prefilled syringe Inject 150 mg into the skin every 28 (twenty-eight) days.    [name] OVER THE COUNTER MEDICATION Take 6 capsules by mouth daily. Balance of Nature Fruits & Veggies  supplement (3 Veggies + 3 Fruit)    . oxyCODONE (OXY IR/ROXICODONE) 5 MG immediate release tablet Take 1 tablet (5 mg total) by mouth every 4 (four) hours as needed for severe pain. 30 tablet 0  . Propylene Glycol (SYSTANE BALANCE) 0.6 % SOLN Place 1 drop into both eyes See admin instructions. Once to twice daily    . TURMERIC PO Take 1,000 mg by mouth daily.     . Multiple Vitamins-Minerals (HAIR/SKIN/NAILS/BIOTIN PO) Take 1 tablet by mouth daily. (Patient not taking: Reported on 01/27/2020)     No current facility-administered medications for this visit.    Physical Exam BP (!) 150/80   Pulse 75   Temp 97.6 F (36.4 C) (Skin)   Resp 20   Ht 5\' 6"  (1.676 m)   Wt 155 lb (70.3 kg)   SpO2 97% Comment: RA  BMI 25.28 kg/m  74 year old woman in no acute distress Alert and oriented x3 with no focal deficits Lungs clear with equal breath sounds bilaterally Incisions healing well Cardiac regular rate and rhythm  Diagnostic Tests: CHEST - 2 VIEW  COMPARISON:  January 18, 2020 and January 17, 2020  FINDINGS: No pneumothorax is evident on this current examination. There is slight bibasilar atelectasis. Lungs elsewhere are  clear. The heart size and pulmonary vascularity are normal. No adenopathy. No bone lesions.  IMPRESSION: No evident pneumothorax. Slight bibasilar atelectasis. No edema or airspace opacity. Heart size within normal limits.   Electronically Signed   By: Lowella Grip III M.D.   On: 01/27/2020 10:39 I personally reviewed the chest x-ray images and concur with the findings noted above.  Overall excellent postoperative appearance.  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  She also had a small right upper lobe nodule.  I did a robotic right middle lobectomy and upper lobe wedge resection on 01/15/2020.  We did mark the upper lobe nodule with  navigational bronchoscopy prior to resection.    Both lesions turned out to be carcinoid tumors.  All the lymph nodes were negative.  Her final stage was T4, N0, 3A due to multiple tumor nodules.  There also was background [place].  From a surgical standpoint she is doing well.  She is only occasionally taking pain medication.  She may begin driving.  Appropriate precautions were discussed.  There are no restrictions on her activities, but she was cautioned to build into new activities gradually.  Plan: Follow-up with Dr. [name] I will plan to see her back in about 2 months with a PA and lateral chest x-ray to check on her progress.  Melrose Nakayama, MD [facility] [phone]

## 2020-01-27 NOTE — Telephone Encounter (Signed)
scheduled appt per 9/7 sch msg - left message with appt date and time

## 2020-01-29 DIAGNOSIS — H43813 Vitreous degeneration, bilateral: Secondary | ICD-10-CM | POA: Diagnosis not present

## 2020-01-29 DIAGNOSIS — H35342 Macular cyst, hole, or pseudohole, left eye: Secondary | ICD-10-CM | POA: Diagnosis not present

## 2020-01-29 DIAGNOSIS — H35363 Drusen (degenerative) of macula, bilateral: Secondary | ICD-10-CM | POA: Diagnosis not present

## 2020-01-29 DIAGNOSIS — H35372 Puckering of macula, left eye: Secondary | ICD-10-CM | POA: Diagnosis not present

## 2020-02-10 ENCOUNTER — Inpatient Hospital Stay: Payer: Medicare PPO | Attending: Internal Medicine | Admitting: Internal Medicine

## 2020-02-10 ENCOUNTER — Inpatient Hospital Stay: Payer: Medicare PPO

## 2020-02-10 ENCOUNTER — Encounter: Payer: Self-pay | Admitting: Internal Medicine

## 2020-02-10 ENCOUNTER — Telehealth: Payer: Self-pay | Admitting: Internal Medicine

## 2020-02-10 ENCOUNTER — Other Ambulatory Visit: Payer: Self-pay

## 2020-02-10 VITALS — BP 164/94 | HR 64 | Temp 98.6°F | Resp 20 | Ht 66.0 in | Wt 156.1 lb

## 2020-02-10 DIAGNOSIS — D3A090 Benign carcinoid tumor of the bronchus and lung: Secondary | ICD-10-CM | POA: Diagnosis not present

## 2020-02-10 DIAGNOSIS — Z79899 Other long term (current) drug therapy: Secondary | ICD-10-CM | POA: Diagnosis not present

## 2020-02-10 DIAGNOSIS — I10 Essential (primary) hypertension: Secondary | ICD-10-CM | POA: Insufficient documentation

## 2020-02-10 DIAGNOSIS — E039 Hypothyroidism, unspecified: Secondary | ICD-10-CM | POA: Insufficient documentation

## 2020-02-10 DIAGNOSIS — C3490 Malignant neoplasm of unspecified part of unspecified bronchus or lung: Secondary | ICD-10-CM

## 2020-02-10 DIAGNOSIS — C342 Malignant neoplasm of middle lobe, bronchus or lung: Secondary | ICD-10-CM | POA: Insufficient documentation

## 2020-02-10 DIAGNOSIS — L501 Idiopathic urticaria: Secondary | ICD-10-CM | POA: Diagnosis not present

## 2020-02-10 DIAGNOSIS — R091 Pleurisy: Secondary | ICD-10-CM | POA: Insufficient documentation

## 2020-02-10 DIAGNOSIS — I739 Peripheral vascular disease, unspecified: Secondary | ICD-10-CM | POA: Insufficient documentation

## 2020-02-10 DIAGNOSIS — Z9049 Acquired absence of other specified parts of digestive tract: Secondary | ICD-10-CM | POA: Insufficient documentation

## 2020-02-10 LAB — CBC WITH DIFFERENTIAL (CANCER CENTER ONLY)
Abs Immature Granulocytes: 0.02 10*3/uL (ref 0.00–0.07)
Basophils Absolute: 0.1 10*3/uL (ref 0.0–0.1)
Basophils Relative: 1 %
Eosinophils Absolute: 0.4 10*3/uL (ref 0.0–0.5)
Eosinophils Relative: 6 %
HCT: 42.1 % (ref 36.0–46.0)
Hemoglobin: 13.6 g/dL (ref 12.0–15.0)
Immature Granulocytes: 0 %
Lymphocytes Relative: 28 %
Lymphs Abs: 2.1 10*3/uL (ref 0.7–4.0)
MCH: 29.8 pg (ref 26.0–34.0)
MCHC: 32.3 g/dL (ref 30.0–36.0)
MCV: 92.3 fL (ref 80.0–100.0)
Monocytes Absolute: 1 10*3/uL (ref 0.1–1.0)
Monocytes Relative: 14 %
Neutro Abs: 3.8 10*3/uL (ref 1.7–7.7)
Neutrophils Relative %: 51 %
Platelet Count: 371 10*3/uL (ref 150–400)
RBC: 4.56 MIL/uL (ref 3.87–5.11)
RDW: 13.6 % (ref 11.5–15.5)
WBC Count: 7.4 10*3/uL (ref 4.0–10.5)
nRBC: 0 % (ref 0.0–0.2)

## 2020-02-10 LAB — CMP (CANCER CENTER ONLY)
ALT: 30 U/L (ref 0–44)
AST: 32 U/L (ref 15–41)
Albumin: 4 g/dL (ref 3.5–5.0)
Alkaline Phosphatase: 132 U/L — ABNORMAL HIGH (ref 38–126)
Anion gap: 8 (ref 5–15)
BUN: 17 mg/dL (ref 8–23)
CO2: 29 mmol/L (ref 22–32)
Calcium: 9.5 mg/dL (ref 8.9–10.3)
Chloride: 104 mmol/L (ref 98–111)
Creatinine: 0.8 mg/dL (ref 0.44–1.00)
GFR, Est AFR Am: >60 mL/min (ref >60)
GFR, Estimated: >60 mL/min (ref >60)
Glucose, Bld: 89 mg/dL (ref 70–99)
Potassium: 4.5 mmol/L (ref 3.5–5.1)
Sodium: 141 mmol/L (ref 135–145)
Total Bilirubin: 0.6 mg/dL (ref 0.3–1.2)
Total Protein: 7.2 g/dL (ref 6.5–8.1)

## 2020-02-10 NOTE — Progress Notes (Signed)
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  PRIOR THERAPY: Status post right middle lobectomy with right upper lobe wedge resection and lymph node dissection under the care of Dr. Roxan Hockey on 01/15/2020.  CURRENT THERAPY: Observation.   INTERVAL HISTORY: Cynthia Rhodes 74 y.o. female returns to the clinic today for follow-up visit.  The patient is feeling fine today with no concerning complaints except for mild soreness on the right side of the chest after the surgical procedure.  She denied having any shortness of breath, cough or hemoptysis.  She denied having any fever or chills.  She has no nausea, vomiting, diarrhea or constipation.  She has no headache or visual changes.  She recently underwent right middle lobectomy with wedge resection of the right upper lobe and lymph node dissection under the care of Dr. Roxan Hockey and the final pathology showed multifocal typical carcinoid tumor involving the right middle lobe as well as the right upper lobe.  The patient is here today for evaluation and discussion of her treatment options after the surgical intervention.  MEDICAL HISTORY: Past Medical History:  Diagnosis Date  . Cancer ([place]) 2021  . History of hiatal hernia   . Hypothyroidism   . Peripheral vascular disease ([place])    varicose veins    ALLERGIES:  is allergic to codeine, drug [tape], and sulfa antibiotics.  MEDICATIONS:  Current Outpatient Medications  Medication Sig Dispense Refill  . acetaminophen (TYLENOL) 650 MG CR tablet Take 1,300 mg by mouth every 8 (eight) hours as needed for pain.    . Calcium Carb-Cholecalciferol (CALCIUM + D3 PO) Take 1 tablet by mouth daily.      [name] levothyroxine (SYNTHROID) 50 MCG tablet Take 50 mcg by mouth every other day. Alternating with 75 mcg    . levothyroxine (SYNTHROID) 75 MCG tablet Take 75 mcg by mouth every other day. Alternating with 50 mcg    . magnesium oxide (MAG-OX) 400 MG tablet Take 400 mg by mouth daily.    . Multiple Vitamins-Minerals (HAIR/SKIN/NAILS/BIOTIN PO) Take 1 tablet by mouth daily. (Patient not taking: Reported on 01/27/2020)    . Multiple Vitamins-Minerals (OCUVITE EYE HEALTH FORMULA PO) Take 1 tablet by mouth daily.    [name] omalizumab Arvid Right) 150 MG/ML prefilled syringe Inject 150 mg into the skin every 28 (twenty-eight) days.    [name] OVER THE COUNTER MEDICATION Take 6 capsules by mouth daily. Balance of Nature Fruits & Veggies supplement (3 Veggies + 3 Fruit)    . oxyCODONE (OXY IR/ROXICODONE) 5 MG immediate release tablet Take 1 tablet (5 mg total) by mouth every 4 (four) hours as needed for severe pain. 30 tablet 0  . Propylene Glycol (SYSTANE BALANCE) 0.6 % SOLN Place 1 drop into both eyes See admin instructions. Once to twice daily    . TURMERIC PO Take 1,000 mg by mouth daily.      No current facility-administered medications for this visit.    SURGICAL HISTORY:  Past Surgical History:  Procedure Laterality Date  . ABDOMINAL HYSTERECTOMY    . APPENDECTOMY  1954  . BRONCHIAL BIOPSY  11/04/2019   Procedure: BRONCHIAL BIOPSIES;  Surgeon: Collene Gobble, MD;  Location: [facility];  Service:  Pulmonary;;  . BRONCHIAL BRUSHINGS  11/04/2019   Procedure: BRONCHIAL BRUSHINGS;  Surgeon: Collene Gobble, MD;  Location: [facility] ENDOSCOPY;  Service: Pulmonary;;  . BRONCHIAL NEEDLE ASPIRATION BIOPSY  11/04/2019   Procedure: BRONCHIAL NEEDLE ASPIRATION BIOPSIES;  Surgeon: Collene Gobble, MD;  Location: [facility] ENDOSCOPY;  Service: Pulmonary;;  . BRONCHIAL WASHINGS  11/04/2019   Procedure: BRONCHIAL WASHINGS;  Surgeon: Collene Gobble, MD;  Location: [facility] ENDOSCOPY;  Service: Pulmonary;;  . BUNIONECTOMY Bilateral   .  CHOLECYSTECTOMY    . [place]  . EYE SURGERY Bilateral 2021  . INTERCOSTAL NERVE BLOCK Right 01/15/2020   Procedure: INTERCOSTAL NERVE BLOCK;  Surgeon: Melrose Nakayama, MD;  Location: [facility];  Service: Thoracic;  Laterality: Right;  . JOINT REPLACEMENT Bilateral    Knee  . KNEE SURGERY Bilateral   . LYMPH NODE DISSECTION N/A 01/15/2020   Procedure: LYMPH NODE DISSECTION;  Surgeon: Melrose Nakayama, MD;  Location: [facility];  Service: Thoracic;  Laterality: N/A;  . REPLACEMENT TOTAL KNEE BILATERAL    . SIGMOIDECTOMY    . TONSILLECTOMY    . VIDEO BRONCHOSCOPY WITH ENDOBRONCHIAL NAVIGATION N/A 11/04/2019   Procedure: VIDEO BRONCHOSCOPY WITH ENDOBRONCHIAL NAVIGATION;  Surgeon: Collene Gobble, MD;  Location: [facility] ENDOSCOPY;  Service: Pulmonary;  Laterality: N/A;  . VIDEO BRONCHOSCOPY WITH ENDOBRONCHIAL NAVIGATION N/A 01/15/2020   Procedure: VIDEO BRONCHOSCOPY WITH ENDOBRONCHIAL NAVIGATION to mark right upper lobe nodule;  Surgeon: Melrose Nakayama, MD;  Location: [facility];  Service: Thoracic;  Laterality: N/A;    REVIEW OF SYSTEMS:  Constitutional: negative Eyes: negative Ears, nose, mouth, throat, and face: negative Respiratory: positive for pleurisy/chest pain Cardiovascular: negative Gastrointestinal: negative Genitourinary:negative Integument/breast: negative Hematologic/lymphatic: negative Musculoskeletal:negative Neurological: negative Behavioral/Psych: negative Endocrine: negative Allergic/Immunologic: negative   PHYSICAL EXAMINATION: General appearance: alert, cooperative and no distress Head: Normocephalic, without obvious abnormality, atraumatic Neck: no adenopathy, no JVD, supple, symmetrical, trachea midline and thyroid not enlarged, symmetric, no tenderness/mass/nodules Lymph nodes: Cervical, supraclavicular, and axillary nodes normal. Resp: clear to auscultation bilaterally Back: symmetric, no curvature. ROM normal. No CVA tenderness. Cardio: regular  rate and rhythm, S1, S2 normal, no murmur, click, rub or gallop GI: soft, non-tender; bowel sounds normal; no masses,  no organomegaly Extremities: extremities normal, atraumatic, no cyanosis or edema Neurologic: Alert and oriented X 3, normal strength and tone. Normal symmetric reflexes. Normal coordination and gait  ECOG PERFORMANCE STATUS: 1 - Symptomatic but completely ambulatory  Blood pressure (!) 164/94, pulse 64, temperature 98.6 F (37 C), temperature source Tympanic, resp. rate 20, height 5\' 6"  (1.676 m), weight 156 lb 1.6 oz (70.8 kg), SpO2 98 %.  LABORATORY DATA: Lab Results  Component Value Date   WBC 7.4 02/10/2020   HGB 13.6 02/10/2020   HCT 42.1 02/10/2020   MCV 92.3 02/10/2020   PLT 371 02/10/2020      Chemistry      Component Value Date/Time   NA 137 01/17/2020 0325   K 3.7 01/17/2020 0325   CL 100 01/17/2020 0325   CO2 28 01/17/2020 0325   BUN 9 01/17/2020 0325   CREATININE 0.70 01/17/2020 0325   CREATININE 0.90 12/10/2019 1303      Component Value Date/Time   CALCIUM 8.4 (L) 01/17/2020 0325   ALKPHOS 84 01/17/2020 0325   AST 75 (H) 01/17/2020 0325   AST 39 12/10/2019 1303   ALT 56 (H) 01/17/2020 0325   ALT 36 12/10/2019 1303   BILITOT 0.6 01/17/2020 0325   BILITOT 0.5  12/10/2019 1303       RADIOGRAPHIC STUDIES: DG Chest 2 View  Result Date: 01/27/2020 CLINICAL DATA:  Cough and shortness of breath EXAM: CHEST - 2 VIEW COMPARISON:  January 18, 2020 and January 17, 2020 FINDINGS: No pneumothorax is evident on this current examination. There is slight bibasilar atelectasis. Lungs elsewhere are clear. The heart size and pulmonary vascularity are normal. No adenopathy. No bone lesions. IMPRESSION: No evident pneumothorax. Slight bibasilar atelectasis. No edema or airspace opacity. Heart size within normal limits. Electronically Signed   By: Lowella Grip III M.D.   On: 01/27/2020 10:39   DG Chest 2 View  Result Date: 01/18/2020 CLINICAL DATA:   Pneumothorax. EXAM: CHEST - 2 VIEW COMPARISON:  January 17, 2020 FINDINGS: There is a tiny right apical pneumothorax measuring 4 mm, not seen previously. No focal infiltrates. The cardiomediastinal silhouette is stable. No other acute interval changes. IMPRESSION: 1. Tiny right apical pneumothorax measuring 4 mm, not seen previously. 2. No other acute abnormalities or changes. Electronically Signed   By: Dorise Bullion III M.D   On: 01/18/2020 08:27   DG Chest 2 View  Result Date: 01/13/2020 CLINICAL DATA:  Preop for right middle lobe cancer. EXAM: CHEST - 2 VIEW COMPARISON:  November 19, 2019. FINDINGS: The heart size and mediastinal contours are within normal limits. No pneumothorax or pleural effusion is noted. Left lung is unremarkable. Stable right middle lobe mass is noted consistent with malignancy. The visualized skeletal structures are unremarkable. IMPRESSION: Stable right middle lobe mass consistent with malignancy. Electronically Signed   By: Marijo Conception M.D.   On: 01/13/2020 15:58   DG Chest Port 1 View  Result Date: 01/17/2020 CLINICAL DATA:  Right middle lobectomy. EXAM: PORTABLE CHEST 1 VIEW COMPARISON:  January 16, 2020 FINDINGS: A right central line and right chest tube remain in place. Postsurgical changes on the right. A small right apical pneumothorax is similar in the interval. No other interval changes or acute abnormalities. IMPRESSION: 1. Support apparatus as above. 2. The small right apical pneumothorax is similar in the interval. 3. No other acute abnormalities. Electronically Signed   By: Dorise Bullion III M.D   On: 01/17/2020 14:18   DG Chest Port 1 View  Result Date: 01/17/2020 CLINICAL DATA:  Chest tube removal EXAM: PORTABLE CHEST 1 VIEW COMPARISON:  January 09, 2020 FINDINGS: The right chest tube and right central line is been removed. No pneumothorax. Postsurgical changes on the right. The heart, hila, mediastinum, lungs, and pleura are otherwise unremarkable.  IMPRESSION: Interval removal of support apparatus including a right-sided chest tube. No pneumothorax. Electronically Signed   By: Dorise Bullion III M.D   On: 01/17/2020 14:17   DG CHEST PORT 1 VIEW  Result Date: 01/16/2020 CLINICAL DATA:  Post RIGHT middle lobectomy and RIGHT upper lobe wedge resection. EXAM: PORTABLE CHEST 1 VIEW COMPARISON:  A 11/09/2019 FINDINGS: Cardiomediastinal contours are stable. Fullness of the RIGHT hilum following partial lung resection changes in the RIGHT chest. RIGHT IJ central venous catheter in stable position also with RIGHT-sided chest tube in similar position to the prior exam. Tiny residual apical pneumothorax appears diminished since the previous study. Lung volumes are slightly decreased compared to the prior exam with LEFT basilar airspace disease. Small amount of subcutaneous emphysema has developed along the RIGHT chest wall along the course of the RIGHT-sided chest tube as it enters the chest wall. On limited assessment skeletal structures without acute process. IMPRESSION: 1. Interval development of small  amount of subcutaneous emphysema along the RIGHT chest wall along the course of the RIGHT chest tube. 2. Tiny residual apical pneumothorax appears diminished since the previous study. 3. Interval development of linear LEFT basilar airspace disease likely atelectasis. Attention on follow-up. Electronically Signed   By: Zetta Bills M.D.   On: 01/16/2020 07:53   DG Chest Port 1 View  Result Date: 01/15/2020 CLINICAL DATA:  74 year old female postoperative day zero status post right middle lobectomy and right upper lobe wedge resection for carcinoid. EXAM: PORTABLE CHEST 1 VIEW COMPARISON:  Chest radiographs 01/13/2020 and earlier. FINDINGS: Portable AP semi upright view at 1234 hours. Mildly reduced right lung volume with right chest tube in place. There is a small right apical pneumothorax. No pleural effusion is evident. Mildly increased density at the right  hilum where staples are noted. Superimposed right IJ approach central line in place, tip at the lower SVC level. Other mediastinal contours are within normal limits. Allowing for portable technique the left lung appears clear. IMPRESSION: 1. Postoperative changes to the right lung and hilum. Right chest tube in place with small right apical pneumothorax. 2. Right IJ central line, tip at the lower SVC level. Electronically Signed   By: Genevie Ann M.D.   On: 01/15/2020 12:50   DG C-ARM BRONCHOSCOPY  Result Date: 01/15/2020 C-ARM BRONCHOSCOPY: Fluoroscopy was utilized by the requesting physician.  No radiographic interpretation.    ASSESSMENT AND PLAN: This is a very pleasant 74 years old white female recently diagnosed with a stage IIIa (T4, N0, M0) well-differentiated neuroendocrine tumor, carcinoid tumor with multifocal disease in the right upper lobe as well as the right middle lobe status post right middle lobectomy and wedge resection of the right upper lobe with lymph node dissection under the care of Dr. Roxan Hockey on 01/15/2020. The patient is recovering well from her surgery and has no concerning complaints. I had a lengthy discussion with the patient today about her current condition and treatment options. I explained to the patient that there is no survival benefit for adjuvant therapy for patient with well-differentiated neuroendocrine carcinoma, typical carcinoid. I explained to the patient that the current standard of care is observation and close monitoring. I will arrange for the patient to come back for follow-up visit in 6 months for evaluation with repeat CT scan of the chest. For hypertension she was advised to take her blood pressure medication as prescribed and to monitor it closely at home. She was advised to call immediately if she has any concerning symptoms in the interval. The patient voices understanding of current disease status and treatment options and is in agreement with  the current care plan.  All questions were answered. The patient knows to call the clinic with any problems, questions or concerns. We can certainly see the patient much sooner if necessary.  The total time spent in the appointment was 30 minutes.  Disclaimer: This note was dictated with voice recognition software. Similar sounding words can inadvertently be transcribed and may not be corrected upon review.

## 2020-02-10 NOTE — Telephone Encounter (Signed)
Scheduled per 09/21 los, patient received updated calender.

## 2020-02-11 ENCOUNTER — Telehealth: Payer: Self-pay | Admitting: Medical Oncology

## 2020-02-11 NOTE — Telephone Encounter (Signed)
Medical clearance requested  for eye surgery 03/02/2020 .  I called back and was put on hold for > 3 minutes and hung up.  Please give  [facility] this message if they call back. Pt is under observation and recent labs can be faxed if needed . [facility] needs to contact pts PCP for clearnace

## 2020-02-12 ENCOUNTER — Other Ambulatory Visit: Payer: Self-pay

## 2020-02-12 ENCOUNTER — Encounter: Payer: Self-pay | Admitting: Emergency Medicine

## 2020-02-12 ENCOUNTER — Ambulatory Visit (INDEPENDENT_AMBULATORY_CARE_PROVIDER_SITE_OTHER): Payer: Medicare PPO | Admitting: Emergency Medicine

## 2020-02-12 VITALS — BP 120/68 | HR 68 | Temp 98.7°F | Ht 66.0 in | Wt 154.4 lb

## 2020-02-12 DIAGNOSIS — D3A090 Benign carcinoid tumor of the bronchus and lung: Secondary | ICD-10-CM

## 2020-02-12 DIAGNOSIS — R0600 Dyspnea, unspecified: Secondary | ICD-10-CM | POA: Insufficient documentation

## 2020-02-12 DIAGNOSIS — R001 Bradycardia, unspecified: Secondary | ICD-10-CM | POA: Diagnosis not present

## 2020-02-12 DIAGNOSIS — R0602 Shortness of breath: Secondary | ICD-10-CM | POA: Diagnosis not present

## 2020-02-12 DIAGNOSIS — Z23 Encounter for immunization: Secondary | ICD-10-CM

## 2020-02-12 MED ORDER — ALBUTEROL SULFATE HFA 108 (90 BASE) MCG/ACT IN AERS: 2.0000 | INHALATION_SPRAY | Freq: Four times a day (QID) | RESPIRATORY_TRACT | 6 refills | Status: DC | PRN

## 2020-02-12 NOTE — Patient Instructions (Addendum)
Try starting albuterol 2 puffs if you need it for shortness of breath, chest tightness, wheezing.  You can take 2 puffs prior to exertion to see if this helps your breathing and her functional capacity. We will refer you to see Dr. Lovena Le with Cardiology to discuss a new finding of bradycardia, evaluate your ECG, especially given your family history Follow with Dr. Lamonte Sakai in 12 months or sooner if you have any problems.

## 2020-02-12 NOTE — Progress Notes (Signed)
Subjective:    Patient ID: Cynthia Rhodes, female    DOB: 11/09/1945, 74 y.o.   MRN: [id number]  HPI 74 year old never smoker with a history of hypothyroidism and allergic disease on Xolair, scheduled to have another injection in June, being managed Dr Curly Rim.  She is referred today for evaluation of a 3.3 lobulated anterior right middle lobe mass that was originally seen on virtual colonoscopy 10/02/2019.  This prompted a CT chest done on 10/06/2019 which I have reviewed, shows a 2.8 x 1.6 spiculated right middle lobe mass concerning for primary malignancy.  Also noted was a 7 x 5 mm nodule in the right upper lobe.  A PET scan has been ordered and is planned for 5/27.   ROV 11/10/19 --follow-up visit further evaluation of pulmonary nodular disease.  We performed bronchoscopy to better evaluate a 2.8 x 1.6 spiculated right middle lobe mass: No malignant cells were seen on cytology or pathology.   She feels well post-procedure.   ROV 02/12/20 --follow-up visit for 74 year old never smoker with hypothyroidism, allergies on Xolair.  She had a lobulated anterior right middle lobe mass that prompted bronchoscopy 11/04/2019 that was nondiagnostic.  We performed a TTNA and a needle core biopsy showed well-differentiated neuroendocrine tumor consistent with an atypical carcinoid.  She underwent surgical resection 01/15/2020.  Pathology from both right upper lobe wedge resection of the nodule and the right middle lobe lesion showed well differentiated carcinoid with clear margins and uninvolved lymph nodes. She has some exertional SOB, walks a total of a mile a day.   Tells me that she has new dx of bradycardia that was observed peri-operatively. She also has a brother with bradycardia (has a pacer).   Some cough, dry and hacking. Not every day.   She underwent pulmonary function testing on 12/30/2019 reviewed by me, shows grossly normal airflows with mild obstruction noted based on curve of her flow volume  loop.  No bronchodilator response, normal lung volumes and normal diffusion capacity.   Review of Systems As per HPI     Objective:   Physical Exam Vitals:   02/12/20 1110  BP: 120/68  Pulse: 68  Temp: 98.7 F (37.1 C)  TempSrc: Temporal  SpO2: 97%  Weight: 154 lb 6.4 oz (70 kg)  Height: 5\' 6"  (1.676 m)   Gen: Pleasant, well-nourished, in no distress,  normal affect  ENT: No lesions,  mouth clear,  oropharynx clear, no postnasal drip  Neck: No JVD, no stridor  Lungs: No use of accessory muscles, no crackles or wheezing on normal respiration, no wheeze on forced expiration  Cardiovascular: RRR, heart sounds normal, no murmur or gallops, no peripheral edema  Musculoskeletal: No deformities, no cyanosis or clubbing  Neuro: alert, awake, non focal  Skin: Warm, no lesions or rash        Assessment & Plan:  Carcinoid tumor of right lung Doing well post surgery.  Surveillance imaging as per T CTS recommendations  Dyspnea With significant allergic disease.  Consider possible obstructive lung disease based on the curve of her flow volume loop.  We will do therapeutic trial of albuterol to see if she gets benefit  Try starting albuterol 2 puffs if you need it for shortness of breath, chest tightness, wheezing.  You can take 2 puffs prior to exertion to see if this helps your breathing and her functional capacity. Follow with Dr. Lamonte Sakai in 12 months or sooner if you have any problems.   Bradycardia Apparently symptomatic bradycardia  noted during hospitalization for her surgery.  This is by her report.  The only ECG I see available showed sinus bradycardia with a heart rate of 58, apparently normal intervals.  She is concerned and I will refer her to cardiology to see if any evaluation is indicated.  We will refer you to see Dr. Lovena Le with Cardiology to discuss a new finding of bradycardia, evaluate your ECG, especially given your family history  Baltazar Apo, MD,  PhD 02/12/2020, 5:21 PM [facility] [phone] or if no answer [phone]

## 2020-02-12 NOTE — Assessment & Plan Note (Signed)
Doing well post surgery.  Surveillance imaging as per T CTS recommendations

## 2020-02-12 NOTE — Assessment & Plan Note (Signed)
With significant allergic disease.  Consider possible obstructive lung disease based on the curve of her flow volume loop.  We will do therapeutic trial of albuterol to see if she gets benefit  Try starting albuterol 2 puffs if you need it for shortness of breath, chest tightness, wheezing.  You can take 2 puffs prior to exertion to see if this helps your breathing and her functional capacity. Follow with Dr. Lamonte Sakai in 12 months or sooner if you have any problems.

## 2020-02-12 NOTE — Assessment & Plan Note (Signed)
Apparently symptomatic bradycardia noted during hospitalization for her surgery.  This is by her report.  The only ECG I see available showed sinus bradycardia with a heart rate of 58, apparently normal intervals.  She is concerned and I will refer her to cardiology to see if any evaluation is indicated.  We will refer you to see Dr. Lovena Le with Cardiology to discuss a new finding of bradycardia, evaluate your ECG, especially given your family history

## 2020-02-16 DIAGNOSIS — R9389 Abnormal findings on diagnostic imaging of other specified body structures: Secondary | ICD-10-CM | POA: Diagnosis not present

## 2020-02-16 DIAGNOSIS — E039 Hypothyroidism, unspecified: Secondary | ICD-10-CM | POA: Diagnosis not present

## 2020-02-17 DIAGNOSIS — H35372 Puckering of macula, left eye: Secondary | ICD-10-CM | POA: Diagnosis not present

## 2020-02-17 DIAGNOSIS — R0600 Dyspnea, unspecified: Secondary | ICD-10-CM | POA: Diagnosis not present

## 2020-02-17 DIAGNOSIS — F418 Other specified anxiety disorders: Secondary | ICD-10-CM | POA: Diagnosis not present

## 2020-02-17 DIAGNOSIS — Z87898 Personal history of other specified conditions: Secondary | ICD-10-CM | POA: Diagnosis not present

## 2020-02-17 DIAGNOSIS — M1990 Unspecified osteoarthritis, unspecified site: Secondary | ICD-10-CM | POA: Diagnosis not present

## 2020-02-17 DIAGNOSIS — C7A090 Malignant carcinoid tumor of the bronchus and lung: Secondary | ICD-10-CM | POA: Diagnosis not present

## 2020-02-17 DIAGNOSIS — E039 Hypothyroidism, unspecified: Secondary | ICD-10-CM | POA: Diagnosis not present

## 2020-02-18 ENCOUNTER — Ambulatory Visit: Payer: Medicare PPO | Admitting: Cardiology

## 2020-02-28 NOTE — Progress Notes (Signed)
Cardiology Office Note:    Date:  03/01/2020   ID:  Cynthia Rhodes, DOB [birth year], MRN [id number]  PCP:  [name], DO  [place] Cardiologist:  No primary care provider on file.  [facility] Electrophysiologist:  None   Referring MD: Collene Gobble, MD    History of Present Illness:    Cynthia Rhodes is a 74 y.o. 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 Lamonte Sakai for evaluation of sinus bradycardia.  Patient states that when she was in the hospital for her carcinoid tumor, she was told she has sinus bradycardia. Patient is notably asymptomatic with no chest pain, lightheadedness, dizziness,  syncope, fatigue. Has some SOB since lung resection that is relieved with albuterol. Walks 30 minutes twice a day without limitations. HR in the office today 65 with no evidence of block. Not on any nodal blocking agents. TSH has reportedly normal on levothyroxine (has been stable on same dosage for 2 years).  BP okay at home.   Labs:  Na 141, K 4.5, Cr 0.8, AST 32, ALT 30, WBC 7.4, HgB 13.6, TSH 1.43  Past Medical History:  Diagnosis Date  . Cancer ([place]) 2021  . History of hiatal hernia   . Hypothyroidism   . Peripheral vascular disease ([place])    varicose veins    Past Surgical History:  Procedure Laterality Date  . ABDOMINAL HYSTERECTOMY    . APPENDECTOMY  1954  . BRONCHIAL BIOPSY  11/04/2019   Procedure: BRONCHIAL BIOPSIES;  Surgeon: Collene Gobble, MD;  Location: [facility] ENDOSCOPY;  Service: Pulmonary;;  . BRONCHIAL BRUSHINGS  11/04/2019   Procedure: BRONCHIAL BRUSHINGS;  Surgeon: Collene Gobble, MD;  Location: [facility] ENDOSCOPY;  Service: Pulmonary;;  . BRONCHIAL NEEDLE ASPIRATION BIOPSY  11/04/2019   Procedure: BRONCHIAL NEEDLE ASPIRATION BIOPSIES;  Surgeon: Collene Gobble, MD;  Location: [facility] ENDOSCOPY;  Service: Pulmonary;;  . BRONCHIAL WASHINGS  11/04/2019    Procedure: BRONCHIAL WASHINGS;  Surgeon: Collene Gobble, MD;  Location: [facility] ENDOSCOPY;  Service: Pulmonary;;  . BUNIONECTOMY Bilateral   . CHOLECYSTECTOMY    . [place]  . EYE SURGERY Bilateral 2021  . INTERCOSTAL NERVE BLOCK Right 01/15/2020   Procedure: INTERCOSTAL NERVE BLOCK;  Surgeon: Melrose Nakayama, MD;  Location: [place];  Service: Thoracic;  Laterality: Right;  . JOINT REPLACEMENT Bilateral    Knee  . KNEE SURGERY Bilateral   . LYMPH NODE DISSECTION N/A 01/15/2020   Procedure: LYMPH NODE DISSECTION;  Surgeon: Melrose Nakayama, MD;  Location: [place];  Service: Thoracic;  Laterality: N/A;  . REPLACEMENT TOTAL KNEE BILATERAL    . SIGMOIDECTOMY    . TONSILLECTOMY    . VIDEO BRONCHOSCOPY WITH ENDOBRONCHIAL NAVIGATION N/A 11/04/2019   Procedure: VIDEO BRONCHOSCOPY WITH ENDOBRONCHIAL NAVIGATION;  Surgeon: Collene Gobble, MD;  Location: [facility] ENDOSCOPY;  Service: Pulmonary;  Laterality: N/A;  . VIDEO BRONCHOSCOPY WITH ENDOBRONCHIAL NAVIGATION N/A 01/15/2020   Procedure: VIDEO BRONCHOSCOPY WITH ENDOBRONCHIAL NAVIGATION to mark right upper lobe nodule;  Surgeon: Melrose Nakayama, MD;  Location: [facility] OR;  Service: Thoracic;  Laterality: N/A;    Current Medications: Current Meds  Medication Sig  . acetaminophen (TYLENOL) 650 MG CR tablet Take 1,300 mg by mouth every 8 (eight) hours as needed for pain.  [name] albuterol (VENTOLIN HFA) 108 (90 Base) MCG/ACT inhaler Inhale 2 puffs into the lungs  every 6 (six) hours as needed for wheezing or shortness of breath.  . Calcium Carb-Cholecalciferol (CALCIUM + D3 PO) Take 1 tablet by mouth daily.   [name] escitalopram (LEXAPRO) 5 MG tablet daily.  [name] levothyroxine (SYNTHROID) 50 MCG tablet Take 50 mcg by mouth every other day. Alternating with 75 mcg  . levothyroxine (SYNTHROID) 75 MCG tablet Take 75 mcg by mouth every other day. Alternating with 50 mcg  . magnesium oxide (MAG-OX) 400 MG tablet Take 400 mg by mouth daily.  . Multiple  Vitamins-Minerals (OCUVITE EYE HEALTH FORMULA PO) Take 1 tablet by mouth daily.  [name] ofloxacin (OCUFLOX) 0.3 % ophthalmic solution daily.  [name] omalizumab Arvid Right) 150 MG/ML prefilled syringe Inject 150 mg into the skin every 28 (twenty-eight) days.  [name] OVER THE COUNTER MEDICATION Take 6 capsules by mouth daily. Balance of Nature Fruits & Veggies supplement (3 Veggies + 3 Fruit)  . prednisoLONE acetate (PRED FORTE) 1 % ophthalmic suspension daily.  [name] Propylene Glycol (SYSTANE BALANCE) 0.6 % SOLN Place 1 drop into both eyes See admin instructions. Once to twice daily  . TURMERIC PO Take 1,000 mg by mouth daily.      Allergies:   Codeine, Drug [tape], and Sulfa antibiotics   Social History   Socioeconomic History  . Marital status: Widowed    Spouse name: Not on file  . Number of children: Not on file  . Years of education: Not on file  . Highest education level: Not on file  Occupational History  . Not on file  Tobacco Use  . Smoking status: Never Smoker  . Smokeless tobacco: Never Used  Vaping Use  . Vaping Use: Never used  Substance and Sexual Activity  . Alcohol use: Yes    Alcohol/week: 7.0 standard drinks    Types: 7 Glasses of wine per week  . Drug use: Never  . Sexual activity: Not Currently  Other Topics Concern  . Not on file  Social History Narrative  . Not on file   Social Determinants of Health   Financial Resource Strain:   . Difficulty of Paying Living Expenses: Not on file  Food Insecurity:   . Worried About Charity fundraiser in the Last Year: Not on file  . Ran Out of Food in the Last Year: Not on file  Transportation Needs:   . Lack of Transportation (Medical): Not on file  . Lack of Transportation (Non-Medical): Not on file  Physical Activity:   . Days of Exercise per Week: Not on file  . Minutes of Exercise per Session: Not on file  Stress:   . Feeling of Stress : Not on file  Social Connections:   . Frequency of Communication with Friends and  Family: Not on file  . Frequency of Social Gatherings with Friends and Family: Not on file  . Attends Religious Services: Not on file  . Active Member of Clubs or Organizations: Not on file  . Attends Archivist Meetings: Not on file  . Marital Status: Not on file     Family History: The patient's family history includes Asthma in her mother; Breast cancer in her mother.  ROS:   Please see the history of present illness.    The patient denies chest pain, chest pressure, dyspnea at rest or with exertion, palpitations, PND, orthopnea, or leg swelling. Denies cough, fever, chills. Denies nausea, vomiting. Denies syncope or presyncope. Denies dizziness or lightheadedness. Denies snoring.  EKGs/Labs/Other Studies Reviewed:    The  following studies were reviewed today: CT chest 09/2019: IMPRESSION: 1. 2.8 x 1.6 cm spiculated mass with pleural tail is noted in the right middle lobe consistent with primary malignancy. PET scan is recommended for further evaluation. These results will be called to the ordering clinician or representative by the Radiologist Assistant, and communication documented in the PACS or zVision Dashboard. 2. 7 x 5 mm nodule is noted in right upper lobe concerning for metastatic lesion. 3. Mild cardiomegaly.  ECG 01/13/20: Sinus braydcardia; no block.  EKG:  EKG is ordered today.  The ekg ordered today demonstrates NSR with HR 65  Recent Labs: 02/10/2020: ALT 30; BUN 17; Creatinine 0.80; Hemoglobin 13.6; Platelet Count 371; Potassium 4.5; Sodium 141  Recent Lipid Panel No results found for: CHOL, TRIG, HDL, CHOLHDL, VLDL, LDLCALC, LDLDIRECT    Physical Exam:    VS:  BP 138/68   Pulse 65   Ht 5\' 6"  (1.676 m)   Wt 153 lb (69.4 kg)   SpO2 97%   BMI 24.69 kg/m     Wt Readings from Last 3 Encounters:  03/01/20 153 lb (69.4 kg)  02/12/20 154 lb 6.4 oz (70 kg)  02/10/20 156 lb 1.6 oz (70.8 kg)     GEN:  Well nourished, well developed in no  acute distress HEENT: Normal NECK: No JVD; No carotid bruits LYMPHATICS: No lymphadenopathy CARDIAC: RR, 1/6 SEM in the RUSB, no rubs, gallops RESPIRATORY:  Clear to auscultation without rales, wheezing or rhonchi  ABDOMEN: Soft, non-tender, non-distended MUSCULOSKELETAL:  No edema; No deformity  SKIN: Warm and dry NEUROLOGIC:  Alert and oriented x 3 PSYCHIATRIC:  Normal affect   ASSESSMENT:    1. Bradycardia    PLAN:    In order of problems listed above:  #Asymptomatic Sinus bradycardia: Patient with asymptomatic bradycardia with HR 50-60s. No evidence of conduction disease on ECG. Denies chest pain, lightheadedness, dizziness, SOB or syncope. Able to exert herself without symptoms. Very active at baseline. Not on nodal agents or herbal supplements. -Reassuringly asymptomatic without evidence of block -Not on nodal agents -TSH normal on current synthroid dosing -Will continue to monitor but no intervention needed from CV standpoint  #Elevated blood pressure: BP elevated 136/60 in office today. -Monitor at home with goal 120s/80s -Continue healthy diet and exercise with goal 150 minutes of moderate exercise per week  #RUL and RML carcinoid: S/p resection in 12/2019. -Follow-up with pulm as scheduled   Medication Adjustments/Labs and Tests Ordered: Current medicines are reviewed at length with the patient today.  Concerns regarding medicines are outlined above.  Orders Placed This Encounter  Procedures  . EKG 12-Lead   No orders of the defined types were placed in this encounter.   Patient Instructions  Medication Instructions:  Your physician recommends that you continue on your current medications as directed. Please refer to the Current Medication list given to you today.  *If you need a refill on your cardiac medications before your next appointment, please call your pharmacy*   Lab Work: None  If you have labs (blood work) drawn today and your tests are  completely normal, you will receive your results only by: [name] MyChart Message (if you have MyChart) OR . A paper copy in the mail If you have any lab test that is abnormal or we need to change your treatment, we will call you to review the results.   Testing/Procedures: None   Follow-Up: At [facility], you and your health needs are our priority.  As part of our continuing mission to provide you with exceptional heart care, we have created designated Provider Care Teams.  These Care Teams include your primary Cardiologist (physician) and Advanced Practice Providers (APPs -  Physician Assistants and Nurse Practitioners) who all work together to provide you with the care you need, when you need it.  We recommend signing up for the patient portal called "MyChart".  Sign up information is provided on this After Visit Summary.  MyChart is used to connect with patients for Virtual Visits (Telemedicine).  Patients are able to view lab/test results, encounter notes, upcoming appointments, etc.  Non-urgent messages can be sent to your provider as well.   To learn more about what you can do with MyChart, go to [email or web address].    Your next appointment:   12 month(s)  The format for your next appointment:   In Person  Provider:   Gwyndolyn Kaufman, MD   Other Instructions Your physician has requested that you regularly monitor and record your blood pressure readings at home. Please use the same machine at the same time of day to check your readings and record them.       Signed, Freada Bergeron, MD  03/01/2020 10:11 AM    [place]

## 2020-03-01 ENCOUNTER — Ambulatory Visit (INDEPENDENT_AMBULATORY_CARE_PROVIDER_SITE_OTHER): Payer: Medicare PPO | Admitting: Cardiology

## 2020-03-01 ENCOUNTER — Encounter: Payer: Self-pay | Admitting: Cardiology

## 2020-03-01 ENCOUNTER — Other Ambulatory Visit: Payer: Self-pay

## 2020-03-01 VITALS — BP 138/68 | HR 65 | Ht 66.0 in | Wt 153.0 lb

## 2020-03-01 DIAGNOSIS — R001 Bradycardia, unspecified: Secondary | ICD-10-CM | POA: Diagnosis not present

## 2020-03-01 NOTE — Patient Instructions (Signed)
Medication Instructions:  Your physician recommends that you continue on your current medications as directed. Please refer to the Current Medication list given to you today.  *If you need a refill on your cardiac medications before your next appointment, please call your pharmacy*   Lab Work: None  If you have labs (blood work) drawn today and your tests are completely normal, you will receive your results only by: [name] MyChart Message (if you have MyChart) OR . A paper copy in the mail If you have any lab test that is abnormal or we need to change your treatment, we will call you to review the results.   Testing/Procedures: None   Follow-Up: At [facility], you and your health needs are our priority.  As part of our continuing mission to provide you with exceptional heart care, we have created designated Provider Care Teams.  These Care Teams include your primary Cardiologist (physician) and Advanced Practice Providers (APPs -  Physician Assistants and Nurse Practitioners) who all work together to provide you with the care you need, when you need it.  We recommend signing up for the patient portal called "MyChart".  Sign up information is provided on this After Visit Summary.  MyChart is used to connect with patients for Virtual Visits (Telemedicine).  Patients are able to view lab/test results, encounter notes, upcoming appointments, etc.  Non-urgent messages can be sent to your provider as well.   To learn more about what you can do with MyChart, go to [email or web address].    Your next appointment:   12 month(s)  The format for your next appointment:   In Person  Provider:   Gwyndolyn Kaufman, MD   Other Instructions Your physician has requested that you regularly monitor and record your blood pressure readings at home. Please use the same machine at the same time of day to check your readings and record them.

## 2020-03-02 DIAGNOSIS — H35372 Puckering of macula, left eye: Secondary | ICD-10-CM | POA: Diagnosis not present

## 2020-03-09 DIAGNOSIS — L501 Idiopathic urticaria: Secondary | ICD-10-CM | POA: Diagnosis not present

## 2020-03-11 DIAGNOSIS — H35372 Puckering of macula, left eye: Secondary | ICD-10-CM | POA: Diagnosis not present

## 2020-03-12 ENCOUNTER — Other Ambulatory Visit: Payer: Self-pay | Admitting: Gastroenterology

## 2020-03-12 ENCOUNTER — Other Ambulatory Visit (HOSPITAL_COMMUNITY): Payer: Self-pay | Admitting: Gastroenterology

## 2020-03-12 DIAGNOSIS — Z85118 Personal history of other malignant neoplasm of bronchus and lung: Secondary | ICD-10-CM

## 2020-03-12 DIAGNOSIS — K635 Polyp of colon: Secondary | ICD-10-CM

## 2020-03-22 DIAGNOSIS — M9903 Segmental and somatic dysfunction of lumbar region: Secondary | ICD-10-CM | POA: Diagnosis not present

## 2020-03-22 DIAGNOSIS — M9901 Segmental and somatic dysfunction of cervical region: Secondary | ICD-10-CM | POA: Diagnosis not present

## 2020-03-22 DIAGNOSIS — M9904 Segmental and somatic dysfunction of sacral region: Secondary | ICD-10-CM | POA: Diagnosis not present

## 2020-03-22 DIAGNOSIS — M9905 Segmental and somatic dysfunction of pelvic region: Secondary | ICD-10-CM | POA: Diagnosis not present

## 2020-03-25 DIAGNOSIS — M9903 Segmental and somatic dysfunction of lumbar region: Secondary | ICD-10-CM | POA: Diagnosis not present

## 2020-03-25 DIAGNOSIS — M9901 Segmental and somatic dysfunction of cervical region: Secondary | ICD-10-CM | POA: Diagnosis not present

## 2020-03-25 DIAGNOSIS — M9904 Segmental and somatic dysfunction of sacral region: Secondary | ICD-10-CM | POA: Diagnosis not present

## 2020-03-25 DIAGNOSIS — M9905 Segmental and somatic dysfunction of pelvic region: Secondary | ICD-10-CM | POA: Diagnosis not present

## 2020-03-29 ENCOUNTER — Telehealth: Payer: Self-pay | Admitting: *Deleted

## 2020-03-29 NOTE — Telephone Encounter (Signed)
I received a vm message from Ms. Kaneko.  I called her back. She explained to me that her PET scan is scheduled after her appt to see Dr.  Roxan Hockey.  I told her I would contact Dr.  Leonarda Salon office to re-schedule and I updated them.

## 2020-03-30 ENCOUNTER — Encounter: Payer: Medicare PPO | Admitting: Thoracic Surgery (Cardiothoracic Vascular Surgery)

## 2020-03-31 ENCOUNTER — Other Ambulatory Visit: Payer: Self-pay

## 2020-03-31 ENCOUNTER — Encounter (HOSPITAL_COMMUNITY)
Admission: RE | Admit: 2020-03-31 | Discharge: 2020-03-31 | Disposition: A | Discharge status: Home / Self Care | Payer: Medicare PPO | Source: Ambulatory Visit | Attending: Gastroenterology | Admitting: Gastroenterology

## 2020-03-31 DIAGNOSIS — R918 Other nonspecific abnormal finding of lung field: Secondary | ICD-10-CM | POA: Diagnosis not present

## 2020-03-31 DIAGNOSIS — Z85118 Personal history of other malignant neoplasm of bronchus and lung: Secondary | ICD-10-CM | POA: Insufficient documentation

## 2020-03-31 DIAGNOSIS — K635 Polyp of colon: Secondary | ICD-10-CM | POA: Insufficient documentation

## 2020-03-31 LAB — GLUCOSE, CAPILLARY: Glucose-Capillary: 105 mg/dL — ABNORMAL HIGH (ref 70–99)

## 2020-03-31 MED ORDER — FLUDEOXYGLUCOSE F - 18 (FDG) INJECTION
7.6400 | Freq: Once | INTRAVENOUS | Status: AC | PRN
  Administered 2020-03-31: 7.64 via INTRAVENOUS

## 2020-04-06 DIAGNOSIS — L501 Idiopathic urticaria: Secondary | ICD-10-CM | POA: Diagnosis not present

## 2020-04-07 ENCOUNTER — Other Ambulatory Visit: Payer: Self-pay | Admitting: Thoracic Surgery (Cardiothoracic Vascular Surgery)

## 2020-04-07 DIAGNOSIS — D3A090 Benign carcinoid tumor of the bronchus and lung: Secondary | ICD-10-CM

## 2020-04-08 DIAGNOSIS — H43813 Vitreous degeneration, bilateral: Secondary | ICD-10-CM | POA: Diagnosis not present

## 2020-04-12 ENCOUNTER — Ambulatory Visit
Admission: RE | Admit: 2020-04-12 | Discharge: 2020-04-12 | Disposition: A | Discharge status: Home / Self Care | Payer: Medicare PPO | Source: Ambulatory Visit | Attending: Thoracic Surgery (Cardiothoracic Vascular Surgery) | Admitting: Thoracic Surgery (Cardiothoracic Vascular Surgery)

## 2020-04-12 ENCOUNTER — Other Ambulatory Visit: Payer: Self-pay

## 2020-04-12 ENCOUNTER — Encounter: Payer: Self-pay | Admitting: Thoracic Surgery (Cardiothoracic Vascular Surgery)

## 2020-04-12 ENCOUNTER — Ambulatory Visit (INDEPENDENT_AMBULATORY_CARE_PROVIDER_SITE_OTHER): Payer: Medicare PPO | Admitting: Thoracic Surgery (Cardiothoracic Vascular Surgery)

## 2020-04-12 VITALS — BP 143/90 | HR 84 | Resp 18 | Ht 66.0 in | Wt 158.0 lb

## 2020-04-12 DIAGNOSIS — Z85118 Personal history of other malignant neoplasm of bronchus and lung: Secondary | ICD-10-CM | POA: Diagnosis not present

## 2020-04-12 DIAGNOSIS — D3A090 Benign carcinoid tumor of the bronchus and lung: Secondary | ICD-10-CM

## 2020-04-12 DIAGNOSIS — Z902 Acquired absence of lung [part of]: Secondary | ICD-10-CM

## 2020-04-12 NOTE — Progress Notes (Signed)
[address]       [place] [address]             [phone]    HPI: Mrs. Haggard returns for scheduled follow-up visit  Maylie "Cynthia Nations" Lambing is a 74 year old woman who was found to have a right middle lobe lung nodule on a CT done for a virtual colonoscopy.  She then had a CT of the chest done which confirmed the right middle lobe nodule and also showed a small right upper lobe nodule.  The middle lobe nodule was hypermetabolic on PET and biopsy showed a well-differentiated carcinoid tumor.  I did a navigational bronchoscopy to mark the right upper lobe nodule and then a robotic right middle lobectomy and upper lobe wedge resection on 01/15/2020.  She did well postoperatively and went home on day 3.  She turned out to have stage IIIa T4, N0 typical carcinoid tumor (2 nodules different lobes).  She saw Dr. [name] and observation was recommended.  I last saw her on 01/27/2020.  She was doing well at that time without any major concerns.  She was taking an oxycodone tablet occasionally.  She currently is feeling well.  She is not having any pain.  She is not having any respiratory issues.  She did have some questions about the findings of atherosclerosis on her PET/CT.  Past Medical History:  Diagnosis Date  . Cancer ([place]) 2021  . History of hiatal hernia   . Hypothyroidism   . Peripheral vascular disease ([place])    varicose veins    Current Outpatient Medications  Medication Sig Dispense Refill  . acetaminophen (TYLENOL) 650 MG CR tablet Take 1,300 mg by mouth every 8 (eight) hours as needed for pain.    Cynthia Rhodes albuterol (VENTOLIN HFA) 108 (90 Base) MCG/ACT inhaler Inhale 2 puffs into the lungs every 6 (six) hours as needed for wheezing or shortness of breath. 8 g 6  . Calcium Carb-Cholecalciferol (CALCIUM + D3 PO) Take 1 tablet by mouth daily.     Cynthia Rhodes levothyroxine (SYNTHROID) 50 MCG tablet Take 50 mcg by mouth every other day. Alternating with 75 mcg    .  levothyroxine (SYNTHROID) 75 MCG tablet Take 75 mcg by mouth every other day. Alternating with 50 mcg    . magnesium oxide (MAG-OX) 400 MG tablet Take 400 mg by mouth daily.    . Multiple Vitamins-Minerals (OCUVITE EYE HEALTH FORMULA PO) Take 1 tablet by mouth daily.    Cynthia Rhodes omalizumab Arvid Right) 150 MG/ML prefilled syringe Inject 150 mg into the skin every 28 (twenty-eight) days.    Cynthia Rhodes OVER THE COUNTER MEDICATION Take 6 capsules by mouth daily. Balance of Nature Fruits & Veggies supplement (3 Veggies + 3 Fruit)    . Propylene Glycol (SYSTANE BALANCE) 0.6 % SOLN Place 1 drop into both eyes See admin instructions. Once to twice daily    . TURMERIC PO Take 1,000 mg by mouth daily.     Cynthia Rhodes escitalopram (LEXAPRO) 5 MG tablet daily.    Cynthia Rhodes ofloxacin (OCUFLOX) 0.3 % ophthalmic solution daily.    . prednisoLONE acetate (PRED FORTE) 1 % ophthalmic suspension daily.     No current facility-administered medications for this visit.    Physical Exam BP (!) 143/90 (BP Location: Left Arm, Patient Position: Sitting)   Pulse 84   Resp 18   Ht 5\' 6"  (1.676 m)   Wt 158 lb (71.7 kg)   SpO2 95% Comment: RA with mask on  BMI 25.10 kg/m  74 year old woman in no acute distress Alert and oriented x3 with no focal deficits Cardiac regular rate and rhythm with normal S1 and S2 Lungs clear bilaterally Incisions well-healed  Diagnostic Tests: CHEST - 2 VIEW  COMPARISON:  01/27/2020, PET-CT from 03/31/2020  FINDINGS: Cardiac shadow is within normal limits. The lungs are well aerated bilaterally. Slight volume loss on the right is noted consistent with the given clinical history of prior lobectomy on the right. No focal mass lesion is seen. No acute bony abnormality is noted.  IMPRESSION: No acute abnormality noted.   Electronically Signed   By: Inez Catalina M.D.   On: 04/12/2020 16:17 I personally reviewed the chest x-ray images and concur with the findings noted above  Impression: Cynthia "Cynthia Nations"  Rhodes is a 74 year old woman who had a right middle lobectomy and right upper lobe wedge resection for stage IIIa (T4, N0) typical carcinoid tumor on 01/15/2020.  Her postoperative course was unremarkable and she is doing well currently.  Carcinoid tumor-Dr. [name] will follow her with CT scans.  Since she did have multiple tumors she is at risk for developing new tumors in the future.  Aortic atherosclerosis-noted on CT.  Minimal given her age.  I will plan to see her back in 9 months for 1 year follow-up visit.  Melrose Nakayama, MD [facility] [phone]

## 2020-04-13 ENCOUNTER — Encounter: Payer: Medicare PPO | Admitting: Thoracic Surgery (Cardiothoracic Vascular Surgery)

## 2020-04-21 DIAGNOSIS — M9904 Segmental and somatic dysfunction of sacral region: Secondary | ICD-10-CM | POA: Diagnosis not present

## 2020-04-21 DIAGNOSIS — M9905 Segmental and somatic dysfunction of pelvic region: Secondary | ICD-10-CM | POA: Diagnosis not present

## 2020-04-21 DIAGNOSIS — M9901 Segmental and somatic dysfunction of cervical region: Secondary | ICD-10-CM | POA: Diagnosis not present

## 2020-04-21 DIAGNOSIS — M9903 Segmental and somatic dysfunction of lumbar region: Secondary | ICD-10-CM | POA: Diagnosis not present

## 2020-04-23 DIAGNOSIS — Z20822 Contact with and (suspected) exposure to covid-19: Secondary | ICD-10-CM | POA: Diagnosis not present

## 2020-04-24 ENCOUNTER — Other Ambulatory Visit: Payer: Self-pay

## 2020-04-24 ENCOUNTER — Encounter (HOSPITAL_COMMUNITY): Payer: Self-pay | Admitting: *Deleted

## 2020-04-24 ENCOUNTER — Emergency Department (HOSPITAL_COMMUNITY): Payer: Medicare PPO

## 2020-04-24 ENCOUNTER — Observation Stay (HOSPITAL_COMMUNITY)
Admission: EM | Admit: 2020-04-24 | Discharge: 2020-04-25 | Disposition: A | Discharge status: Home / Self Care | Payer: Medicare PPO | Attending: Internal Medicine | Admitting: Internal Medicine

## 2020-04-24 DIAGNOSIS — Z79899 Other long term (current) drug therapy: Secondary | ICD-10-CM | POA: Diagnosis not present

## 2020-04-24 DIAGNOSIS — Z7982 Long term (current) use of aspirin: Secondary | ICD-10-CM | POA: Insufficient documentation

## 2020-04-24 DIAGNOSIS — M25519 Pain in unspecified shoulder: Secondary | ICD-10-CM | POA: Diagnosis not present

## 2020-04-24 DIAGNOSIS — R0789 Other chest pain: Principal | ICD-10-CM | POA: Insufficient documentation

## 2020-04-24 DIAGNOSIS — E039 Hypothyroidism, unspecified: Secondary | ICD-10-CM | POA: Diagnosis not present

## 2020-04-24 DIAGNOSIS — R110 Nausea: Secondary | ICD-10-CM | POA: Diagnosis not present

## 2020-04-24 DIAGNOSIS — I10 Essential (primary) hypertension: Secondary | ICD-10-CM | POA: Diagnosis not present

## 2020-04-24 DIAGNOSIS — R0602 Shortness of breath: Secondary | ICD-10-CM | POA: Diagnosis not present

## 2020-04-24 DIAGNOSIS — R079 Chest pain, unspecified: Secondary | ICD-10-CM | POA: Diagnosis not present

## 2020-04-24 DIAGNOSIS — R52 Pain, unspecified: Secondary | ICD-10-CM | POA: Diagnosis not present

## 2020-04-24 DIAGNOSIS — Z20822 Contact with and (suspected) exposure to covid-19: Secondary | ICD-10-CM | POA: Insufficient documentation

## 2020-04-24 DIAGNOSIS — R072 Precordial pain: Secondary | ICD-10-CM

## 2020-04-24 LAB — BASIC METABOLIC PANEL
Anion gap: 9 (ref 5–15)
BUN: 20 mg/dL (ref 8–23)
CO2: 23 mmol/L (ref 22–32)
Calcium: 9.3 mg/dL (ref 8.9–10.3)
Chloride: 105 mmol/L (ref 98–111)
Creatinine, Ser: 0.66 mg/dL (ref 0.44–1.00)
GFR, Estimated: >60 mL/min (ref >60)
Glucose, Bld: 101 mg/dL — ABNORMAL HIGH (ref 70–99)
Potassium: 3.9 mmol/L (ref 3.5–5.1)
Sodium: 137 mmol/L (ref 135–145)

## 2020-04-24 LAB — LIPID PANEL
Cholesterol: 205 mg/dL — ABNORMAL HIGH (ref 0–200)
HDL: 66 mg/dL (ref >40)
LDL Cholesterol: 124 mg/dL — ABNORMAL HIGH (ref 0–99)
Total CHOL/HDL Ratio: 3.1 RATIO
Triglycerides: 74 mg/dL (ref <150)
VLDL: 15 mg/dL (ref 0–40)

## 2020-04-24 LAB — TROPONIN I (HIGH SENSITIVITY)
Troponin I (High Sensitivity): 5 ng/L (ref <18)
Troponin I (High Sensitivity): 5 ng/L (ref <18)

## 2020-04-24 LAB — CBC
HCT: 43.6 % (ref 36.0–46.0)
Hemoglobin: 14.1 g/dL (ref 12.0–15.0)
MCH: 29.7 pg (ref 26.0–34.0)
MCHC: 32.3 g/dL (ref 30.0–36.0)
MCV: 91.8 fL (ref 80.0–100.0)
Platelets: 311 10*3/uL (ref 150–400)
RBC: 4.75 MIL/uL (ref 3.87–5.11)
RDW: 13.3 % (ref 11.5–15.5)
WBC: 10.1 10*3/uL (ref 4.0–10.5)
nRBC: 0 % (ref 0.0–0.2)

## 2020-04-24 LAB — MAGNESIUM: Magnesium: 2.2 mg/dL (ref 1.7–2.4)

## 2020-04-24 LAB — RESP PANEL BY RT-PCR (FLU A&B, COVID) ARPGX2
Influenza A by PCR: NEGATIVE
Influenza B by PCR: NEGATIVE
SARS Coronavirus 2 by RT PCR: NEGATIVE

## 2020-04-24 LAB — C-REACTIVE PROTEIN: CRP: 2 mg/dL — ABNORMAL HIGH (ref <1.0)

## 2020-04-24 MED ORDER — FENTANYL CITRATE (PF) 100 MCG/2ML IJ SOLN
50.0000 ug | Freq: Once | INTRAMUSCULAR | Status: AC
  Administered 2020-04-24: 50 ug via INTRAVENOUS
  Filled 2020-04-24: qty 2

## 2020-04-24 MED ORDER — ATORVASTATIN CALCIUM 40 MG PO TABS
40.0000 mg | ORAL_TABLET | Freq: Every day | ORAL | Status: DC
  Administered 2020-04-24 – 2020-04-25 (×2): 40 mg via ORAL
  Filled 2020-04-24 (×2): qty 1

## 2020-04-24 MED ORDER — LEVOTHYROXINE SODIUM 50 MCG PO TABS
50.0000 ug | ORAL_TABLET | ORAL | Status: DC
  Administered 2020-04-25: 50 ug via ORAL
  Filled 2020-04-24: qty 1

## 2020-04-24 MED ORDER — OXYCODONE HCL 5 MG PO TABS
2.5000 mg | ORAL_TABLET | ORAL | Status: DC | PRN
  Administered 2020-04-24 – 2020-04-25 (×2): 5 mg via ORAL
  Administered 2020-04-25 (×2): 2.5 mg via ORAL
  Filled 2020-04-24 (×4): qty 1

## 2020-04-24 MED ORDER — IOHEXOL 350 MG/ML SOLN
100.0000 mL | Freq: Once | INTRAVENOUS | Status: AC | PRN
  Administered 2020-04-24: 100 mL via INTRAVENOUS

## 2020-04-24 MED ORDER — POLYETHYLENE GLYCOL 3350 17 G PO PACK
17.0000 g | PACK | Freq: Every day | ORAL | Status: DC
  Administered 2020-04-24: 17 g via ORAL
  Filled 2020-04-24: qty 1

## 2020-04-24 MED ORDER — ASPIRIN EC 81 MG PO TBEC
81.0000 mg | DELAYED_RELEASE_TABLET | Freq: Every day | ORAL | Status: DC
  Administered 2020-04-25: 81 mg via ORAL
  Filled 2020-04-24: qty 1

## 2020-04-24 MED ORDER — HYDROMORPHONE HCL 1 MG/ML IJ SOLN
0.5000 mg | Freq: Once | INTRAMUSCULAR | Status: AC
  Administered 2020-04-24: 0.5 mg via INTRAVENOUS
  Filled 2020-04-24: qty 1

## 2020-04-24 MED ORDER — ONDANSETRON HCL 4 MG/2ML IJ SOLN: 4.0000 mg | Freq: Four times a day (QID) | INTRAMUSCULAR | Status: DC | PRN

## 2020-04-24 MED ORDER — NITROGLYCERIN 0.4 MG SL SUBL
0.4000 mg | SUBLINGUAL_TABLET | SUBLINGUAL | Status: DC | PRN
  Administered 2020-04-24 (×3): 0.4 mg via SUBLINGUAL
  Filled 2020-04-24 (×2): qty 1

## 2020-04-24 MED ORDER — ALBUTEROL SULFATE HFA 108 (90 BASE) MCG/ACT IN AERS
2.0000 | INHALATION_SPRAY | Freq: Four times a day (QID) | RESPIRATORY_TRACT | Status: DC | PRN
  Filled 2020-04-24: qty 6.7

## 2020-04-24 MED ORDER — ACETAMINOPHEN 325 MG PO TABS: 650.0000 mg | ORAL_TABLET | Freq: Three times a day (TID) | ORAL | Status: DC | PRN

## 2020-04-24 MED ORDER — ONDANSETRON HCL 4 MG/2ML IJ SOLN
4.0000 mg | Freq: Once | INTRAMUSCULAR | Status: AC
  Administered 2020-04-24: 4 mg via INTRAVENOUS
  Filled 2020-04-24: qty 2

## 2020-04-24 MED ORDER — LEVOTHYROXINE SODIUM 75 MCG PO TABS: 75.0000 ug | ORAL_TABLET | ORAL | Status: DC

## 2020-04-24 MED ORDER — ENOXAPARIN SODIUM 40 MG/0.4ML ~~LOC~~ SOLN: 40.0000 mg | SUBCUTANEOUS | Status: DC

## 2020-04-24 NOTE — ED Provider Notes (Signed)
  Physical Exam  BP (!) 194/92   Pulse 65   Temp 98 F (36.7 C) (Oral)   Resp 19   Ht 5\' 6"  (1.676 m)   Wt 70.3 kg   SpO2 96%   BMI 25.02 kg/m   Physical Exam  ED Course/Procedures     Procedures  MDM  Patient received in signout.  Left anterior chest pain.  Has 2 days.  Reproducible.  States pain has been constant but worse with exertion.  EKG reassuring.  Troponin negative x2.  Continued pain.  Had negative Covid test yesterday.  Also more fatigued.  Heart pathway score of 4.  With this continued pain will admit to the hospital for further evaluation and treatment.       Davonna Belling, MD 04/24/20 [id number]

## 2020-04-24 NOTE — ED Triage Notes (Signed)
BIB EMS, left shoulder pain radiating to chest when walking today, pain has been noted for 2 days but worse today. 157/91-76-96%-CBG 125 18 R AC.   Pt states she did have nausea, SHOB and pain in chest radiating to left shoulder. She walks daily and this prevented her from completing her walk today.

## 2020-04-24 NOTE — Progress Notes (Signed)
   04/24/20 2017  Assess: MEWS Score  BP 131/83  Pulse Rate (!) 103  Assess: MEWS Score  MEWS Temp 0  MEWS Systolic 0  MEWS Pulse 1  MEWS RR 1  MEWS LOC 0  MEWS Score 2  MEWS Score Color Yellow  Assess: if the MEWS score is Yellow or Red  Were vital signs taken at a resting state? Yes  Early Detection of Sepsis Score *See Row Information* Low  MEWS guidelines implemented *See Row Information* Yes  Treat  MEWS Interventions Administered scheduled meds/treatments  Pain Scale 0-10  Pain Score 5  Pain Type Acute pain  Pain Location Chest  Pain Orientation Left  Pain Descriptors / Indicators Crying  Pain Onset Sudden  Patients Stated Pain Goal 2  Pain Intervention(s) Medication (See eMAR)  Complains of  (pain)  Take Vital Signs  Increase Vital Sign Frequency  Yellow: Q 2hr X 2 then Q 4hr X 2, if remains yellow, continue Q 4hrs  Notify: Charge Nurse/RN  Name of Charge Nurse/RN Notified Hilda  Date Charge Nurse/RN Notified 04/24/20  Time Charge Nurse/RN Notified 2019  Notify: Provider  Provider Name/Title x. [place]  Date Provider Notified 04/24/20  Time Provider Notified 2227  Notification Type Page  Notification Reason Other (Comment) (protocol)  Response No new orders

## 2020-04-24 NOTE — ED Provider Notes (Signed)
[facility] DEPT Provider Note   CSN: [id number] Arrival date & time: 04/24/20  1129     History Chief Complaint  Patient presents with  . Shoulder Pain    Cynthia Rhodes is a 74 y.o. female.  Patient complains of left-sided chest pain last couple days worse when she exerts herself and also worse when she breathes.  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  The history is provided by the patient. No language interpreter was used.  Chest Pain Pain location:  L chest Pain quality: aching   Pain radiates to:  L shoulder Pain severity:  Moderate Onset quality:  Sudden Timing:  Constant Progression:  Waxing and waning Chronicity:  New Context: breathing   Associated symptoms: no abdominal pain, no back pain, no cough, no fatigue and no headache        Past Medical History:  Diagnosis Date  . Cancer ([place]) 2021  . History of hiatal hernia   . Hypothyroidism   . Peripheral vascular disease ([place])    varicose veins    Patient Active Problem List   Diagnosis Date Noted  . Dyspnea 02/12/2020  . Bradycardia 02/12/2020  . S/P Robotic Assisted Wedge Resection Right Upper Lobe Lung, Right Middle Lobectomy  01/15/2020  . 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 Pulmonary nodules 10/10/2019    Past Surgical History:  Procedure Laterality Date  . ABDOMINAL HYSTERECTOMY    . APPENDECTOMY  1954  . BRONCHIAL BIOPSY  11/04/2019   Procedure: BRONCHIAL BIOPSIES;  Surgeon: Collene Gobble, MD;  Location[facility] ENDOSCOPY;  Service: Pulmonary;;  . BRONCHIAL BRUSHINGS  11/04/2019   Procedure: BRONCHIAL BRUSHINGS;  Surgeon: Collene Gobble, MD;  Location: [facility] ENDOSCOPY;  Service: Pulmonary;;  . BRONCHIAL NEEDLE ASPIRATION BIOPSY  11/04/2019   Procedure: BRONCHIAL NEEDLE ASPIRATION BIOPSIES;  Surgeon: Collene Gobble, MD;  Location: [facility] ENDOSCOPY;  Service: Pulmonary;;  . BRONCHIAL WASHINGS   11/04/2019   Procedure: BRONCHIAL WASHINGS;  Surgeon: Collene Gobble, MD;  Location: [facility] ENDOSCOPY;  Service: Pulmonary;;  . BUNIONECTOMY Bilateral   . CHOLECYSTECTOMY    . [place]  . EYE SURGERY Bilateral 2021  . INTERCOSTAL NERVE BLOCK Right 01/15/2020   Procedure: INTERCOSTAL NERVE BLOCK;  Surgeon: Melrose Nakayama, MD;  Location: [facility];  Service: Thoracic;  Laterality: Right;  . JOINT REPLACEMENT Bilateral    Knee  . KNEE SURGERY Bilateral   . LYMPH NODE DISSECTION N/A 01/15/2020   Procedure: LYMPH NODE DISSECTION;  Surgeon: Melrose Nakayama, MD;  Location: [facility];  Service: Thoracic;  Laterality: N/A;  . REPLACEMENT TOTAL KNEE BILATERAL    . SIGMOIDECTOMY    . TONSILLECTOMY    . VIDEO BRONCHOSCOPY WITH ENDOBRONCHIAL NAVIGATION N/A 11/04/2019   Procedure: VIDEO BRONCHOSCOPY WITH ENDOBRONCHIAL NAVIGATION;  Surgeon: Collene Gobble, MD;  Location: [facility] ENDOSCOPY;  Service: Pulmonary;  Laterality: N/A;  . VIDEO BRONCHOSCOPY WITH ENDOBRONCHIAL NAVIGATION N/A 01/15/2020   Procedure: VIDEO BRONCHOSCOPY WITH ENDOBRONCHIAL NAVIGATION to mark right upper lobe nodule;  Surgeon: Melrose Nakayama, MD;  Location: [facility];  Service: Thoracic;  Laterality: N/A;     OB History   No obstetric history on file.     Family History  Problem Relation Age of Onset  . Asthma Mother   . Breast cancer Mother     Social History   Tobacco Use  . Smoking  status: Never Smoker  . Smokeless tobacco: Never Used  Vaping Use  . Vaping Use: Never used  Substance Use Topics  . Alcohol use: Yes    Alcohol/week: 7.0 standard drinks    Types: 7 Glasses of wine per week  . Drug use: Never    Home Medications Prior to Admission medications   Medication Sig Start Date End Date Taking? Authorizing Provider  acetaminophen (TYLENOL) 650 MG CR tablet Take 1,300 mg by mouth every 8 (eight) hours as needed for pain.   Yes [provider]  albuterol (VENTOLIN HFA) 108 (90 Base) MCG/ACT  inhaler Inhale 2 puffs into the lungs every 6 (six) hours as needed for wheezing or shortness of breath. 02/12/20  Yes Collene Gobble, MD  aspirin EC 81 MG tablet Take 324 mg by mouth every 6 (six) hours as needed for mild pain or fever. Swallow whole.   Yes [provider]  Calcium Carb-Cholecalciferol (CALCIUM + D3 PO) Take 1 tablet by mouth daily.    Yes [provider]  levothyroxine (SYNTHROID) 50 MCG tablet Take 50 mcg by mouth every other day. Alternating with 75 mcg   Yes [provider]  levothyroxine (SYNTHROID) 75 MCG tablet Take 75 mcg by mouth every other day. Alternating with 50 mcg 09/29/19  Yes [provider]  lidocaine (LIDODERM) 5 % Place 1 patch onto the skin daily as needed (pain). Remove & Discard patch within 12 hours or as directed by MD   Yes [provider]  magnesium oxide (MAG-OX) 400 MG tablet Take 400 mg by mouth daily.   Yes [provider]  Multiple Vitamins-Minerals (OCUVITE EYE HEALTH FORMULA PO) Take 1 tablet by mouth daily.   Yes [provider]  omalizumab Arvid Right) 150 MG/ML prefilled syringe Inject 150 mg into the skin every 28 (twenty-eight) days.   Yes [provider]  OVER THE COUNTER MEDICATION Take 6 capsules by mouth daily. Balance of Nature Fruits & Veggies supplement (3 Veggies + 3 Fruit)   Yes [provider]  polyvinyl alcohol (LIQUIFILM TEARS) 1.4 % ophthalmic solution Place 1 drop into both eyes as needed for dry eyes.   Yes [provider]  TURMERIC PO Take 1,000 mg by mouth daily.    Yes [provider]    Allergies    Codeine, Drug [tape], and Sulfa antibiotics  Review of Systems   Review of Systems  Constitutional: Negative for appetite change and fatigue.  HENT: Negative for congestion, ear discharge and sinus pressure.   Eyes: Negative for discharge.  Respiratory: Negative for cough.   Cardiovascular: Positive for chest pain.   Gastrointestinal: Negative for abdominal pain and diarrhea.  Genitourinary: Negative for frequency and hematuria.  Musculoskeletal: Negative for back pain.  Skin: Negative for rash.  Neurological: Negative for seizures and headaches.  Psychiatric/Behavioral: Negative for hallucinations.    Physical Exam Updated Vital Signs BP (!) 178/86   Pulse 70   Temp 98 F (36.7 C) (Oral)   Resp (!) 21   Ht 5\' 6"  (1.676 m)   Wt 70.3 kg   SpO2 98%   BMI 25.02 kg/m   Physical Exam Vitals and nursing note reviewed.  Constitutional:      Appearance: She is well-developed.  HENT:     Head: Normocephalic.     Nose: Nose normal.  Eyes:     General: No scleral icterus.    Conjunctiva/sclera: Conjunctivae normal.  Neck:     Thyroid: No thyromegaly.  Cardiovascular:     Rate and Rhythm: Normal rate and regular rhythm.     Heart sounds: No murmur heard.  No friction rub. No gallop.   Pulmonary:     Breath sounds: No stridor. No wheezing or rales.  Chest:     Chest wall: No tenderness.  Abdominal:     General: There is no distension.     Tenderness: There is no abdominal tenderness. There is no rebound.  Musculoskeletal:        General: Normal range of motion.     Cervical back: Neck supple.  Lymphadenopathy:     Cervical: No cervical adenopathy.  Skin:    Findings: No erythema or rash.  Neurological:     Mental Status: She is alert and oriented to person, place, and time.     Motor: No abnormal muscle tone.     Coordination: Coordination normal.  Psychiatric:        Behavior: Behavior normal.     ED Results / Procedures / Treatments   Labs (all labs ordered are listed, but only abnormal results are displayed) Labs Reviewed  BASIC METABOLIC PANEL - Abnormal; Notable for the following components:      Result Value   Glucose, Bld 101 (*)    All other components within normal limits  CBC  TROPONIN I (HIGH SENSITIVITY)  TROPONIN I (HIGH SENSITIVITY)    EKG EKG  Interpretation  Date/Time:  Saturday April 24 2020 11:46:07 EST Ventricular Rate:  77 PR Interval:    QRS Duration: 103 QT Interval:  380 QTC Calculation: 430 R Axis:     Text Interpretation: Sinus rhythm Probable left atrial enlargement Consider inferior infarct 12 Lead; [place] Confirmed by Milton Ferguson [phone] on 04/24/2020 1:50:10 PM Also confirmed by Milton Ferguson [phone])  on 04/24/2020 2:05:03 PM   Radiology DG Chest 2 View  Result Date: 04/24/2020 CLINICAL DATA:  Chest pain.  Shortness of breath. EXAM: CHEST - 2 VIEW COMPARISON:  April 12, 2020 FINDINGS: The heart size and mediastinal contours are within normal limits. Both lungs are clear. The visualized skeletal structures are unremarkable. IMPRESSION: No active cardiopulmonary disease. Electronically Signed   By: Dorise Bullion III M.D   On: 04/24/2020 13:04    Procedures Procedures (including critical care time)  Medications Ordered in ED Medications  HYDROmorphone (DILAUDID) injection 0.5 mg (has no administration in time range)  ondansetron (ZOFRAN) injection 4 mg (has no administration in time range)    ED Course  I have reviewed the triage vital signs and the nursing notes.  Pertinent labs & imaging results that were available during my care of the patient were reviewed by me and considered in my medical decision making (see chart for details).    MDM Rules/Calculators/A&P                          Patient with new onset exertional chest pain.  She is to get a CT angio of the chest. Final Clinical Impression(s) / ED Diagnoses Final diagnoses:  None    Rx / DC Orders ED Discharge Orders    None       Milton Ferguson, MD 04/26/20 1009

## 2020-04-24 NOTE — H&P (Signed)
History and Physical    [name]:[id number] DOB: [birth year] DOA: 04/24/2020  PCP: [name], DO Patient coming from: home Chief Complaint: chest pain  HPI: Cynthia Rhodes is a 74 y.o. female with a pertinent history of of thyroidism, PVD, aortic atherosclerosis varicose veins lung cancer, right middle lobe nodule and was hypermetabolic on PET and biopsy and showed a well-differentiated carcinoid tumor, bronchoscopy was done on 01/15/2020 and turned out to have stage IIIa T4N0 typical carcinoid tumor in was seen by Dr. Earlie Server and observation was recommended.  She presented to the ED with chest pain today.  Pt states that on Friday morning she woke up with left chest pain that radiates to left neck and shoulder, it feels better when she leans forward, never happenned before.  But it is reproducible.  She does feel like it is worst somewhat when she was walking.  She mentioned to the ED staff that she had her back adjusted and denies any shingles.  Never tried NTG, took ASA325mg  earlier.   Denies any recent infections.  Of note, aortic atherosclerosis was seen on the CT scan  In the ED, hemodynamically stable first troponin 5, second 5 wbc 10.1, hgb 14.1, Na 137, K 3.9, Ca 9.3 EKG showed v1 and v2 inverted twaves cxr negative CTa shows - negative for pulmonary embolism, with posturigcal changes on the right with asmall amoutn of new fluid or low density and is nonspecific, probably needs follow up imaging as below for tiny nodules. Aorta looks okay  Review of Systems: As per HPI otherwise 10 point review of systems negative.  Other pertinents as below:  General - no new HA's or visual changes HEENT - swallowing okay, denies any URI symptoms Cardio - as per hpi, denies any palpitations Resp - denies cough, but pleuritic in nature, denies hemoptysis, GI - has some nausea and not sure if worst with food,  GU - denies urianry complaints MSK - denies any new joint  or back pain Skin - denies new skin changes Neuro - denies new numbness or weakness Psych - mood is koay, denies depression or anxiety   Past Medical History:  Diagnosis Date  . Cancer ([place]) 2021  . History of hiatal hernia   . Hypothyroidism   . Peripheral vascular disease ([place])    varicose veins    Past Surgical History:  Procedure Laterality Date  . ABDOMINAL HYSTERECTOMY    . APPENDECTOMY  1954  . BRONCHIAL BIOPSY  11/04/2019   Procedure: BRONCHIAL BIOPSIES;  Surgeon: Collene Gobble, MD;  Location: [facility] ENDOSCOPY;  Service: Pulmonary;;  . BRONCHIAL BRUSHINGS  11/04/2019   Procedure: BRONCHIAL BRUSHINGS;  Surgeon: Collene Gobble, MD;  Location: [facility] ENDOSCOPY;  Service: Pulmonary;;  . BRONCHIAL NEEDLE ASPIRATION BIOPSY  11/04/2019   Procedure: BRONCHIAL NEEDLE ASPIRATION BIOPSIES;  Surgeon: Collene Gobble, MD;  Location: [facility] ENDOSCOPY;  Service: Pulmonary;;  . BRONCHIAL WASHINGS  11/04/2019   Procedure: BRONCHIAL WASHINGS;  Surgeon: Collene Gobble, MD;  Location: [facility] ENDOSCOPY;  Service: Pulmonary;;  . BUNIONECTOMY Bilateral   . CHOLECYSTECTOMY    . [place]  . EYE SURGERY Bilateral 2021  . INTERCOSTAL NERVE BLOCK Right 01/15/2020   Procedure: INTERCOSTAL NERVE BLOCK;  Surgeon: Melrose Nakayama, MD;  Location: [place];  Service: Thoracic;  Laterality: Right;  . JOINT REPLACEMENT Bilateral    Knee  . KNEE SURGERY Bilateral   . LYMPH NODE DISSECTION N/A 01/15/2020  Procedure: LYMPH NODE DISSECTION;  Surgeon: Melrose Nakayama, MD;  Location: [place];  Service: Thoracic;  Laterality: N/A;  . REPLACEMENT TOTAL KNEE BILATERAL    . SIGMOIDECTOMY    . TONSILLECTOMY    . VIDEO BRONCHOSCOPY WITH ENDOBRONCHIAL NAVIGATION N/A 11/04/2019   Procedure: VIDEO BRONCHOSCOPY WITH ENDOBRONCHIAL NAVIGATION;  Surgeon: Collene Gobble, MD;  Location: [place] ENDOSCOPY;  Service: Pulmonary;  Laterality: N/A;  . VIDEO BRONCHOSCOPY WITH ENDOBRONCHIAL NAVIGATION N/A 01/15/2020   Procedure:  VIDEO BRONCHOSCOPY WITH ENDOBRONCHIAL NAVIGATION to mark right upper lobe nodule;  Surgeon: Melrose Nakayama, MD;  Location: [place];  Service: Thoracic;  Laterality: N/A;     reports that she has never smoked. She has never used smokeless tobacco. She reports current alcohol use of about 7.0 standard drinks of alcohol per week. She reports that she does not use drugs.  Allergies  Allergen Reactions  . Codeine Nausea Only and Other (See Comments)    Severe headaches   . Drug [Tape] Hives  . Sulfa Antibiotics Nausea And Vomiting    Family History  Problem Relation Age of Onset  . Asthma Mother   . Breast cancer Mother     Prior to Admission medications   Medication Sig Start Date End Date Taking? Authorizing Provider  acetaminophen (TYLENOL) 650 MG CR tablet Take 1,300 mg by mouth every 8 (eight) hours as needed for pain.   Yes [provider]  albuterol (VENTOLIN HFA) 108 (90 Base) MCG/ACT inhaler Inhale 2 puffs into the lungs every 6 (six) hours as needed for wheezing or shortness of breath. 02/12/20  Yes Collene Gobble, MD  aspirin EC 81 MG tablet Take 324 mg by mouth every 6 (six) hours as needed for mild pain or fever. Swallow whole.   Yes [provider]  Calcium Carb-Cholecalciferol (CALCIUM + D3 PO) Take 1 tablet by mouth daily.    Yes [provider]  levothyroxine (SYNTHROID) 50 MCG tablet Take 50 mcg by mouth every other day. Alternating with 75 mcg   Yes [provider]  levothyroxine (SYNTHROID) 75 MCG tablet Take 75 mcg by mouth every other day. Alternating with 50 mcg 09/29/19  Yes [provider]  lidocaine (LIDODERM) 5 % Place 1 patch onto the skin daily as needed (pain). Remove & Discard patch within 12 hours or as directed by MD   Yes [provider]  magnesium oxide (MAG-OX) 400 MG tablet Take 400 mg by mouth daily.   Yes [provider]  Multiple Vitamins-Minerals (OCUVITE EYE HEALTH FORMULA PO) Take 1  tablet by mouth daily.   Yes [provider]  omalizumab [place] Right) 150 MG/ML prefilled syringe Inject 150 mg into the skin every 28 (twenty-eight) days.   Yes [provider]  OVER THE COUNTER MEDICATION Take 6 capsules by mouth daily. Balance of Nature Fruits & Veggies supplement (3 Veggies + 3 Fruit)   Yes [provider]  polyvinyl alcohol (LIQUIFILM TEARS) 1.4 % ophthalmic solution Place 1 drop into both eyes as needed for dry eyes.   Yes [provider]  TURMERIC PO Take 1,000 mg by mouth daily.    Yes [provider]    Physical Exam: Vitals:   04/24/20 1730 04/24/20 1734 04/24/20 1754 04/24/20 1754  BP: (!) 146/49   (!) 148/89  Pulse: 78   73  Resp: (!) 21   20  Temp:  97.9 F (36.6 C) 97.7 F (36.5 C) 97.7 F (36.5 C)  TempSrc:  Oral Oral Oral  SpO2: 100%   100%  Weight:   69.6 kg   Height:   5\' 6"  (1.676 m)     Constitutional: NAD   Eyes: pupils equal and reactive to light, anicteric, without injection ENMT: MMM, throat without exudates or erythema Neck: normal, supple, no masses, no thyromegaly noted Respiratory: CTAB, nwob, reproducible chest pain on left chest, and on bottom of rib cage.  Cardiovascular: rrr w/o mrg, warm extremities Abdomen: NBS, NT,    Musculoskeletal: moving all 4 extremities, strength grossly intact 5/5 in the UE and LE's, DTR's okay, can tell feels uncomfortable somewhat Skin: no rashes, lesions, ulcers. No induration Neurologic: CN 2-12 grossly intact. Sensation intact Psychiatric: AO appearing, mentation appropriate  Labs on Admission: I have personally reviewed following labs and imaging studies  CBC: Recent Labs  Lab 04/24/20 1212  WBC 10.1  HGB 14.1  HCT 43.6  MCV 91.8  PLT 409   Basic Metabolic Panel: Recent Labs  Lab 04/24/20 1212  NA 137  K 3.9  CL 105  CO2 23  GLUCOSE 101*  BUN 20  CREATININE 0.66  CALCIUM 9.3   GFR: Estimated Creatinine Clearance: 56.9 mL/min (by C-G  formula based on SCr of 0.66 mg/dL). Liver Function Tests: No results for input(s): AST, ALT, ALKPHOS, BILITOT, PROT, ALBUMIN in the last 168 hours. No results for input(s): LIPASE, AMYLASE in the last 168 hours. No results for input(s): AMMONIA in the last 168 hours. Coagulation Profile: No results for input(s): INR, PROTIME in the last 168 hours. Cardiac Enzymes: No results for input(s): CKTOTAL, CKMB, CKMBINDEX, TROPONINI in the last 168 hours. BNP (last 3 results) No results for input(s): PROBNP in the last 8760 hours. HbA1C: No results for input(s): HGBA1C in the last 72 hours. CBG: No results for input(s): GLUCAP in the last 168 hours. Lipid Profile: No results for input(s): CHOL, HDL, LDLCALC, TRIG, CHOLHDL, LDLDIRECT in the last 72 hours. Thyroid Function Tests: No results for input(s): TSH, T4TOTAL, FREET4, T3FREE, THYROIDAB in the last 72 hours. Anemia Panel: No results for input(s): VITAMINB12, FOLATE, FERRITIN, TIBC, IRON, RETICCTPCT in the last 72 hours. Urine analysis:    Component Value Date/Time   COLORURINE YELLOW 01/13/2020 1044   APPEARANCEUR CLEAR 01/13/2020 1044   LABSPEC 1.016 01/13/2020 1044   PHURINE 5.0 01/13/2020 1044   GLUCOSEU NEGATIVE 01/13/2020 1044   HGBUR NEGATIVE 01/13/2020 1044   BILIRUBINUR NEGATIVE 01/13/2020 1044   [place] 01/13/2020 1044   PROTEINUR NEGATIVE 01/13/2020 1044   NITRITE NEGATIVE 01/13/2020 1044   LEUKOCYTESUR NEGATIVE 01/13/2020 1044    Radiological Exams on Admission: DG Chest 2 View  Result Date: 04/24/2020 CLINICAL DATA:  Chest pain.  Shortness of breath. EXAM: CHEST - 2 VIEW COMPARISON:  April 12, 2020 FINDINGS: The heart size and mediastinal contours are within normal limits. Both lungs are clear. The visualized skeletal structures are unremarkable. IMPRESSION: No active cardiopulmonary disease. Electronically Signed   By: Dorise Bullion III M.D   On: 04/24/2020 13:04   CT Angio Chest PE W and/or Wo  Contrast  Result Date: 04/24/2020 CLINICAL DATA:  74 year old with left shoulder and chest pain. Shortness of breath. Evaluate for pulmonary embolism. History of right middle lobectomy and right upper lobe wedge resection. EXAM: CT ANGIOGRAPHY CHEST WITH CONTRAST TECHNIQUE: Multidetector CT imaging of the chest was performed using the standard protocol during bolus administration of intravenous contrast. Multiplanar CT image reconstructions and MIPs were obtained to evaluate the vascular anatomy. CONTRAST:  110mL OMNIPAQUE IOHEXOL 350 MG/ML SOLN COMPARISON:  10/06/2019 and PET-CT 03/31/2020 FINDINGS: Cardiovascular: Pulmonary arteries are adequately opacified on this examination. No evidence for pulmonary embolism. Ascending thoracic aorta measures roughly 3.8 cm. Heart size is prominent without significant pericardial fluid. Mediastinum/Nodes: There appears to be new low-density in the right infrahilar region and this could be related to a small amount of fluid. This was not clearly present on the recent PET-CT. No significant lymph node enlargement. No evidence for axillary lymph node enlargement. Lungs/Pleura: Trachea and mainstem bronchi are patent. Patient has undergone a right middle lobectomy. The low-density material or fluid in the right hilar region is associated with the lobectomy site. There is a nodular structure along the right major fissure on sequence 6, image 41 that measures roughly 4 mm and not significantly changed from the recent PET-CT. Postoperative changes along the medial aspect of the right upper lobe. Tiny nodule in the right lower lobe on sequence 6, image 70 measures 3 mm and similar to the recent PET-CT. No significant pleural fluid. Patchy densities in the left lower lobe are suggestive for atelectasis. 2 mm peripheral nodule in the lingula on sequence 6 image 81 is stable. No significant airspace disease or consolidation in the lungs. Upper Abdomen: Low density areas in the upper  kidneys may be related to parapelvic cysts and similar to the recent PET-CT. 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 Mild fullness of the central intrahepatic bile ducts are similar to the prior chest CT. Overall, no acute abnormalities in the upper abdomen. Musculoskeletal: No acute bone abnormality. Review of the MIP images confirms the above findings. IMPRESSION: 1. Negative for pulmonary embolism. 2. Postsurgical changes in right lung with a small amount of new fluid or low-density in the right hilar region adjacent to the right middle lobectomy site. This fluid or low-density is nonspecific but appears to be new since 03/31/2020. Consider cardiothoracic consultation with regards to this finding. 3. No acute disease in the lungs. Stable small pulmonary nodules. Recommend attention to the small nodules on follow-up imaging. Electronically Signed   By: Markus Daft M.D.   On: 04/24/2020 15:56    EKG: Independently reviewed. First showed twi's in v1 and v2 and second showed better lead placement and no ischemic changes, NSR  Assessment/Plan Active Problems:   Chest pain  Cynthia Rhodes is a 74 y.o. female with a pertinent history of of thyroidism, PVD, aortic atherosclerosis varicose veins lung cancer, right middle lobe nodule and was hypermetabolic on PET and biopsy and showed a well-differentiated carcinoid tumor, bronchoscopy was done on 01/15/2020 and turned out to have stage IIIa T4N0 typical carcinoid tumor in was seen by Dr. Earlie Server and observation was recommended.  She presented to the ED with chest pain today and admitted for observation given HEART score of 4 and somewhat typical component.  Chest pain, do not think ACS right now will hold off on anticoagulation and antiplatelets, think mostly muskuloskeletal possibly after a manipulation or pericardial in nature and will awiat for any evidence of this on echo and odd not much on EKG.  S/p covid testing here, s/p both covid  vaccines  1. Chest pain: This is new.  The patient has HEART score of 4.  Angina is fairly typical but reproducible.  Other potential causes of chest pain (PE, dissection, pancreatitis, pneumonia/effusion, pericarditis) are doubted.  We have been asked to admit the patient for observation and etiology consultation with Cardiology tomorrow.  -Serial troponins are  ordered -Telemetry -Echocardiogram ordered -Consult to cardiology, appreciate recommendations --follow up electrolytes, 4k, 2mg  --daily aspirin --tsh, lipid profile, hgab1c --cardiology consult in the am, NPO at midnight for potential stress test   ?h/o hypomagnesemia - on magnesium at home Hypothyroidism-continue 75 mcg, alternating with 50 mcg -Reactive airway disease-hold home Xolair, albuterol as needed  Attention to Follow up small nodules in the outpatient setting Patient and/or Family completely agreed with the plan, expressed understanding and I answered all questions.  DVT prophylaxis: Lovenox SQ Code Status: Full code Family Communication: n/a Disposition Plan: home when able  Consults called: cardiology Admission status: observation   Medical decision making: Patient seen at 7:25 PM on 04/24/2020.  The patient was discussed ED provider and messaged cardiology to be seen in the morning. What exists of the patient's chart was reviewed in depth.  Clinical condition: stable  A total of 70 minutes utilized during this admission.  [facility]   If 7PM-7AM, please contact night-coverage [email or web address] Password [email or web address]  04/24/2020, 7:15 PM

## 2020-04-25 ENCOUNTER — Observation Stay (HOSPITAL_BASED_OUTPATIENT_CLINIC_OR_DEPARTMENT_OTHER): Payer: Medicare PPO

## 2020-04-25 DIAGNOSIS — R079 Chest pain, unspecified: Secondary | ICD-10-CM | POA: Diagnosis not present

## 2020-04-25 DIAGNOSIS — R072 Precordial pain: Secondary | ICD-10-CM | POA: Diagnosis not present

## 2020-04-25 DIAGNOSIS — E7800 Pure hypercholesterolemia, unspecified: Secondary | ICD-10-CM

## 2020-04-25 DIAGNOSIS — I10 Essential (primary) hypertension: Secondary | ICD-10-CM | POA: Diagnosis not present

## 2020-04-25 DIAGNOSIS — I700 Atherosclerosis of aorta: Secondary | ICD-10-CM | POA: Diagnosis not present

## 2020-04-25 DIAGNOSIS — R0789 Other chest pain: Principal | ICD-10-CM

## 2020-04-25 LAB — ECHOCARDIOGRAM COMPLETE
Height: 66 in
S' Lateral: 2.8 cm
Weight: 2455.04 oz

## 2020-04-25 LAB — HEMOGLOBIN A1C
Hgb A1c MFr Bld: 6 % — ABNORMAL HIGH (ref 4.8–5.6)
Mean Plasma Glucose: 125.5 mg/dL

## 2020-04-25 LAB — SEDIMENTATION RATE: Sed Rate: 10 mm/hr (ref 0–22)

## 2020-04-25 LAB — TSH: TSH: 1.397 u[IU]/mL (ref 0.350–4.500)

## 2020-04-25 MED ORDER — TRAMADOL HCL 50 MG PO TABS: 50.0000 mg | ORAL_TABLET | Freq: Four times a day (QID) | ORAL | Status: DC | PRN

## 2020-04-25 MED ORDER — ASPIRIN 81 MG PO TBEC
81.0000 mg | DELAYED_RELEASE_TABLET | Freq: Every day | ORAL | 11 refills | Status: DC
Start: 2020-04-26 — End: 2020-08-10

## 2020-04-25 MED ORDER — ATORVASTATIN CALCIUM 40 MG PO TABS: 40.0000 mg | ORAL_TABLET | Freq: Every day | ORAL | 1 refills | Status: DC

## 2020-04-25 MED ORDER — DICLOFENAC SODIUM 1 % EX GEL: 2.0000 g | Freq: Four times a day (QID) | CUTANEOUS | 0 refills | Status: DC | PRN

## 2020-04-25 MED ORDER — AMLODIPINE BESYLATE 5 MG PO TABS: 5.0000 mg | ORAL_TABLET | Freq: Every day | ORAL | 1 refills | Status: DC

## 2020-04-25 MED ORDER — TRAMADOL HCL 50 MG PO TABS
50.0000 mg | ORAL_TABLET | Freq: Three times a day (TID) | ORAL | 0 refills | Status: DC | PRN
Start: 2020-04-25 — End: 2020-08-10

## 2020-04-25 MED ORDER — AMLODIPINE BESYLATE 5 MG PO TABS
5.0000 mg | ORAL_TABLET | Freq: Every day | ORAL | Status: DC
  Administered 2020-04-25: 5 mg via ORAL
  Filled 2020-04-25: qty 1

## 2020-04-25 NOTE — Discharge Summary (Signed)
Physician Discharge Summary  KASSADY LABOY TFT:[id number] DOB: [birth year] DOA: 04/24/2020  PCP: [name], DO  Admit date: 04/24/2020 Discharge date: 04/25/2020  Admitted From: Home Disposition: Home  Recommendations for Outpatient Follow-up:  1. Follow up with PCP in 1-2 weeks 2. Please obtain BMP/CBC in one week 3. Please follow up on the following pending results:  Home Health: No Equipment/Devices: None  Discharge Condition: Stable Code Status:   Code Status: Full Code Diet recommendation:  Diet Order            Diet Heart Room service appropriate? 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 [name] and currently on observation presented to the ED with chest pain 2/4.   As per the report "Pt stated that on Friday morning she woke up with left chest pain that radiates to left neck and shoulder, it feels better when she leans forward, never happenned before. But it is reproducible. She does feel like it is worst somewhat when she was walking. She mentioned to the ED staff that she had her back adjusted and denies any shingles. Never tried NTG, took ASA325mg  earlier".  Of note, aortic atherosclerosis was seen on the CT scan In the ED, hemodynamically stable troponin 5>5 wbc 10.1, hgb 14.1, Na 137, K 3.9, Ca 9.3 EKG showed v1 and v2 inverted twaves Chest x-ray negative, CTA showed  "Negative for pulmonary embolism, with posturigcal changes on the right with asmall amoutn of new fluid or low density and is nonspecific, probably needs follow up imaging as below for tiny nodules. Aorta looks okay Cardiology consulted patient was admitted for chest pain evaluation Seen by cardiology based on echo EKG troponin and pain  is reproducible did not advise further cardiac work-up. Okay to discharge her home with pain control muscle relaxant., Heat and local therapy She'll follow up with cardiology PCP as outpatient Patient would like to be discharged home today  Discharge Diagnoses:  Chest pain atypical likely musculoskeletal continue with local pain control measures. Seen by cardiology and had extensive evaluation with echo EKG troponin. CT chest negative for PE. Pain controlled by oxy.  Patient requesting oxy or tramadol for pain to go home with and I have prescribed tramadol only for severe pain on as-needed basis.  Given prescription for diclofenac but she reported that she experienced rash in the past so advised not to use it.  Aortic sclerosis/hyperlipidemia, goal LDL less than 70, start on atorvastatin 40 mg, on aspirin 81.  New diagnosis of hypertension blood pressure 190s  this admission, seen by cardiology and has been started on amlodipine, follow-up outpatient to monitor.  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 [name] and currently on observation  Prediabetes hemoglobin 6.0, dietary education and counseling Hypothyroidism on Synthroid, euthyroid TSH 1.3  Consults:  Cardiology  Subjective: Alert and oriented, wanting to go, she was cleared by cardiology Discharge Exam: Vitals:   04/25/20 1244 04/25/20 1400  BP:  134/85  Pulse: 100 (!) 104  Resp: (!) 25 17  Temp:    SpO2: 95% 98%   General: Pt is alert, awake, not in acute distress Cardiovascular: RRR,  S1/S2 +, no rubs, no gallops Respiratory: CTA bilaterally, no wheezing, no rhonchi Abdominal: Soft, NT, ND, bowel sounds + Extremities: no edema, no cyanosis  Discharge Instructions  Discharge Instructions    Discharge instructions   Complete by: As directed    Please follow-up with your primary care doctor for blood pressure monitoring  which you need to take every day at home. Continue local pain control measures  Please call call MD or return to ER for similar or worsening recurring problem that brought you to hospital or if any fever,nausea/vomiting,abdominal pain, uncontrolled pain, chest pain,  shortness of breath or any other alarming symptoms.  Please follow-up your doctor as instructed in a week time and call the office for appointment.  Please avoid alcohol, smoking, or any other illicit substance and maintain healthy habits including taking your regular medications as prescribed.  You were cared for by a hospitalist during your hospital stay. If you have any questions about your discharge medications or the care you received while you were in the hospital after you are discharged, you can call the unit and ask to speak with the hospitalist on call if the hospitalist that took care of you is not available.  Once you are discharged, your primary care physician will handle any further medical issues. Please note that NO REFILLS for any discharge medications will be authorized once you are discharged, as it is imperative that you return to your primary care physician (or establish a relationship with a primary care physician if you do not have one) for your aftercare needs so that they can reassess your need for medications and monitor your lab values   Increase activity slowly   Complete by: As directed      Allergies as of 04/25/2020      Reactions   Codeine Nausea Only, Other (See Comments)   Severe headaches    Drug [tape] Hives   Sulfa Antibiotics Nausea And Vomiting   Voltaren [diclofenac Sodium] Itching, Rash      Medication List    TAKE these medications   acetaminophen 650 MG CR tablet Commonly known as: TYLENOL Take 1,300 mg by mouth every 8 (eight) hours as needed for pain.   albuterol 108 (90 Base) MCG/ACT inhaler Commonly known as: VENTOLIN HFA Inhale 2 puffs into the lungs every 6 (six) hours as  needed for wheezing or shortness of breath.   amLODipine 5 MG tablet Commonly known as: NORVASC Take 1 tablet (5 mg total) by mouth daily. Hold for systolic blood pressure less than 120. Start taking on: April 26, 2020   aspirin 81 MG EC tablet Take 1 tablet (81 mg total) by mouth daily. Swallow whole. Start taking on: April 26, 2020 What changed:   how much to take  when to take this  reasons to take this   atorvastatin 40 MG tablet Commonly known as: LIPITOR Take 1 tablet (40 mg total) by mouth daily. Start taking on: April 26, 2020   CALCIUM + D3 PO Take 1 tablet by mouth daily.   levothyroxine 50 MCG tablet Commonly known as: SYNTHROID Take 50 mcg by mouth every other day. Alternating with 75 mcg   levothyroxine 75 MCG tablet Commonly known as: SYNTHROID Take 75 mcg by mouth every other day. Alternating with 50 mcg   lidocaine 5 % Commonly known as: LIDODERM Place 1 patch onto the skin daily as needed (pain). Remove & Discard patch within 12 hours or as directed by MD  magnesium oxide 400 MG tablet Commonly known as: MAG-OX Take 400 mg by mouth daily.   OCUVITE EYE HEALTH FORMULA PO Take 1 tablet by mouth daily.   OVER THE COUNTER MEDICATION Take 6 capsules by mouth daily. Balance of Nature Fruits & Veggies supplement (3 Veggies + 3 Fruit)   polyvinyl alcohol 1.4 % ophthalmic solution Commonly known as: LIQUIFILM TEARS Place 1 drop into both eyes as needed for dry eyes.   traMADol 50 MG tablet Commonly known as: Ultram Take 1 tablet (50 mg total) by mouth every 8 (eight) hours as needed for up to 10 doses for severe pain.   TURMERIC PO Take 1,000 mg by mouth daily.   Xolair 150 MG/ML prefilled syringe Generic drug: omalizumab Inject 150 mg into the skin every 28 (twenty-eight) days.       Follow-up Information    [name], DO Follow up in 1 week(s).   Specialty: Family Medicine Contact information: [address] [place]  [place] [address] [phone]        Freada Bergeron, MD .   Specialties: Cardiology, Radiology Contact information: [address] [place] [address] [phone]              Allergies  Allergen Reactions  . Codeine Nausea Only and Other (See Comments)    Severe headaches   . Drug [Tape] Hives  . Sulfa Antibiotics Nausea And Vomiting  . Voltaren [Diclofenac Sodium] Itching and Rash    The results of significant diagnostics from this hospitalization (including imaging, microbiology, ancillary and laboratory) are listed below for reference.    Microbiology: Recent Results (from the past 240 hour(s))  Resp Panel by RT-PCR (Flu A&B, Covid) Nasopharyngeal Swab     Status: None   Collection Time: 04/24/20  4:41 PM   Specimen: Nasopharyngeal Swab; Nasopharyngeal(NP) swabs in vial transport medium  Result Value Ref Range Status   SARS Coronavirus 2 by RT PCR NEGATIVE NEGATIVE Final    Comment: (NOTE) SARS-CoV-2 target nucleic acids are NOT DETECTED.  The SARS-CoV-2 RNA is generally detectable in upper respiratory specimens during the acute phase of infection. The lowest concentration of SARS-CoV-2 viral copies this assay can detect is 138 copies/mL. A negative result does not preclude SARS-Cov-2 infection and should not be used as the sole basis for treatment or other patient management decisions. A negative result may occur with  improper specimen collection/handling, submission of specimen other than nasopharyngeal swab, presence of viral mutation(s) within the areas targeted by this assay, and inadequate number of viral copies(<138 copies/mL). A negative result must be combined with clinical observations, patient history, and epidemiological information. The expected result is Negative.  Fact Sheet for Patients:  EntrepreneurPulse.com.au  Fact Sheet for Healthcare Providers:  IncredibleEmployment.be  This  test is no t yet approved or cleared by the [place] FDA and  has been authorized for detection and/or diagnosis of SARS-CoV-2 by FDA under an Emergency Use Authorization (EUA). This EUA will remain  in effect (meaning this test can be used) for the duration of the COVID-19 declaration under Section 564(b)(1) of the Act, 21 U.S.C.section 360bbb-3(b)(1), unless the authorization is terminated  or revoked sooner.       Influenza A by PCR NEGATIVE NEGATIVE Final   Influenza B by PCR NEGATIVE NEGATIVE Final    Comment: (NOTE) The Xpert Xpress SARS-CoV-2/FLU/RSV plus assay is intended as an aid in the diagnosis of influenza from Nasopharyngeal swab specimens and should  not be used as a sole basis for treatment. Nasal washings and aspirates are unacceptable for Xpert Xpress SARS-CoV-2/FLU/RSV testing.  Fact Sheet for Patients: EntrepreneurPulse.com.au  Fact Sheet for Healthcare Providers: IncredibleEmployment.be  This test is not yet approved or cleared by the [place] FDA and has been authorized for detection and/or diagnosis of SARS-CoV-2 by FDA under an Emergency Use Authorization (EUA). This EUA will remain in effect (meaning this test can be used) for the duration of the COVID-19 declaration under Section 564(b)(1) of the Act, 21 U.S.C. section 360bbb-3(b)(1), unless the authorization is terminated or revoked.  Performed at [facility], [place] [address] [place], [place] [address]     Procedures/Studies: DG Chest 2 View  Result Date: 04/24/2020 CLINICAL DATA:  Chest pain.  Shortness of breath. EXAM: CHEST - 2 VIEW COMPARISON:  April 12, 2020 FINDINGS: The heart size and mediastinal contours are within normal limits. Both lungs are clear. The visualized skeletal structures are unremarkable. IMPRESSION: No active cardiopulmonary disease. Electronically Signed   By: Dorise Bullion III M.D   On: 04/24/2020 13:04    DG Chest 2 View  Result Date: 04/12/2020 CLINICAL DATA:  History of right-sided lung carcinoma EXAM: CHEST - 2 VIEW COMPARISON:  01/27/2020, PET-CT from 03/31/2020 FINDINGS: Cardiac shadow is within normal limits. The lungs are well aerated bilaterally. Slight volume loss on the right is noted consistent with the given clinical history of prior lobectomy on the right. No focal mass lesion is seen. No acute bony abnormality is noted. IMPRESSION: No acute abnormality noted. Electronically Signed   By: Inez Catalina M.D.   On: 04/12/2020 16:17   CT Angio Chest PE W and/or Wo Contrast  Result Date: 04/24/2020 CLINICAL DATA:  74 year old with left shoulder and chest pain. Shortness of breath. Evaluate for pulmonary embolism. History of right middle lobectomy and right upper lobe wedge resection. EXAM: CT ANGIOGRAPHY CHEST WITH CONTRAST TECHNIQUE: Multidetector CT imaging of the chest was performed using the standard protocol during bolus administration of intravenous contrast. Multiplanar CT image reconstructions and MIPs were obtained to evaluate the vascular anatomy. CONTRAST:  155mL OMNIPAQUE IOHEXOL 350 MG/ML SOLN COMPARISON:  10/06/2019 and PET-CT 03/31/2020 FINDINGS: Cardiovascular: Pulmonary arteries are adequately opacified on this examination. No evidence for pulmonary embolism. Ascending thoracic aorta measures roughly 3.8 cm. Heart size is prominent without significant pericardial fluid. Mediastinum/Nodes: There appears to be new low-density in the right infrahilar region and this could be related to a small amount of fluid. This was not clearly present on the recent PET-CT. No significant lymph node enlargement. No evidence for axillary lymph node enlargement. Lungs/Pleura: Trachea and mainstem bronchi are patent. Patient has undergone a right middle lobectomy. The low-density material or fluid in the right hilar region is associated with the lobectomy site. There is a nodular structure along the  right major fissure on sequence 6, image 41 that measures roughly 4 mm and not significantly changed from the recent PET-CT. Postoperative changes along the medial aspect of the right upper lobe. Tiny nodule in the right lower lobe on sequence 6, image 70 measures 3 mm and similar to the recent PET-CT. No significant pleural fluid. Patchy densities in the left lower lobe are suggestive for atelectasis. 2 mm peripheral nodule in the lingula on sequence 6 image 81 is stable. No significant airspace disease or consolidation in the lungs. Upper Abdomen: Low density areas in the upper kidneys may be related to parapelvic cysts and similar to the recent PET-CT.  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 Mild fullness of the central intrahepatic bile ducts are similar to the prior chest CT. Overall, no acute abnormalities in the upper abdomen. Musculoskeletal: No acute bone abnormality. Review of the MIP images confirms the above findings. IMPRESSION: 1. Negative for pulmonary embolism. 2. Postsurgical changes in right lung with a small amount of new fluid or low-density in the right hilar region adjacent to the right middle lobectomy site. This fluid or low-density is nonspecific but appears to be new since 03/31/2020. Consider cardiothoracic consultation with regards to this finding. 3. No acute disease in the lungs. Stable small pulmonary nodules. Recommend attention to the small nodules on follow-up imaging. Electronically Signed   By: Markus Daft M.D.   On: 04/24/2020 15:56   NM PET Image Restage (PS) Skull Base to Thigh  Result Date: 03/31/2020 CLINICAL DATA:  Subsequent treatment strategy for lung cancer. EXAM: NUCLEAR MEDICINE PET SKULL BASE TO THIGH TECHNIQUE: 7.64 mCi F-18 FDG was injected intravenously. Full-ring PET imaging was performed from the skull base to thigh after the radiotracer. CT data was obtained and used for attenuation correction and anatomic localization. Fasting blood  glucose: 105 mg/dl COMPARISON:  10/16/2019 FINDINGS: Mediastinal blood pool activity: SUV max 2.48 Liver activity: SUV max NA NECK: No hypermetabolic lymph nodes in the neck. Incidental CT findings: none CHEST: Status post right middle lobectomy and right upper lobe wedge resection. No complicating features identified. No FDG avid pulmonary nodules or mass identified. 3 mm peripheral nodule in the right upper lobe is too small to reliably characterize, image 24/8. Unchanged from previous exam. 3 mm nodule within the anterior left lower lobe is unchanged, image 46/8. Perifissural nodule is identified along the posterior major fissure, image 22/8. This is favored to represent a small intrapulmonary lymph node. No enlarged axillary, supraclavicular, mediastinal or hilar lymph nodes. Incidental CT findings: Cardiac enlargement. Aortic atherosclerotic calcifications noted. ABDOMEN/PELVIS: No abnormal hypermetabolic activity within the liver, pancreas, adrenal glands, or spleen. No hypermetabolic lymph nodes in the abdomen or pelvis. Incidental CT findings: Aortic atherosclerotic calcifications noted. Bilateral renal sinus cysts are noted. SKELETON: No focal hypermetabolic activity to suggest skeletal metastasis. Incidental CT findings: none IMPRESSION: 1. Status post right middle lobectomy and right upper lobe wedge resection. No specific findings to suggest residual or recurrent FDG avid tumor. 2. Small nonspecific lung nodules are identified which are too small to reliably characterize. These are unchanged from previous exam. Attention on follow-up imaging advised. 3.  Aortic Atherosclerosis (ICD10-I70.0). Electronically Signed   By: Kerby Moors M.D.   On: 03/31/2020 20:34   ECHOCARDIOGRAM COMPLETE  Result Date: 04/25/2020    ECHOCARDIOGRAM REPORT   Patient Name:   NYLENE INLOW Date of Exam: 04/25/2020 Medical Rec #:  [id number]        Height:       66.0 in Accession #:    [id number]       Weight:       153.4  lb Date of Birth:  July 11, 1945        BSA:          1.787 m Patient Age:    74 years         BP:           148/90 mmHg Patient Gender: F                HR:           96 bpm. Exam Location:  Inpatient Procedure: 2D Echo Indications:   chest pain  History:       Patient has no prior history of Echocardiogram examinations.                Cancer.  Sonographer:   Jannett Celestine RDCS (AE) Referring      [id number] Sueanne Margarita Phys: IMPRESSIONS  1. Left ventricular ejection fraction, by estimation, is 65 to 70%. The left ventricle has normal function. The left ventricle has no regional wall motion abnormalities. Indeterminate diastolic filling due to E-A fusion.  2. Right ventricular systolic function is normal. The right ventricular size is normal. Tricuspid regurgitation signal is inadequate for assessing PA pressure.  3. The mitral valve is normal in structure. No evidence of mitral valve regurgitation. No evidence of mitral stenosis. Moderate mitral annular calcification.  4. The aortic valve is grossly normal. There is mild calcification of the aortic valve. There is mild thickening of the aortic valve. Aortic valve regurgitation is not visualized. Mild aortic valve sclerosis is present, with no evidence of aortic valve stenosis.  5. Pulmonic valve regurgitation not visualized.  6. The inferior vena cava is normal in size with greater than 50% respiratory variability, suggesting right atrial pressure of 3 mmHg. Comparison(s): No prior Echocardiogram. Conclusion(s)/Recommendation(s): Otherwise normal echocardiogram, with minor abnormalities described in the report. FINDINGS  Left Ventricle: Left ventricular ejection fraction, by estimation, is 65 to 70%. The left ventricle has normal function. The left ventricle has no regional wall motion abnormalities. The left ventricular internal cavity size was normal in size. There is  no left ventricular hypertrophy. Indeterminate diastolic filling due to E-A fusion. Right  Ventricle: The right ventricular size is normal. No increase in right ventricular wall thickness. Right ventricular systolic function is normal. Tricuspid regurgitation signal is inadequate for assessing PA pressure. Left Atrium: Left atrial size was normal in size. Right Atrium: Right atrial size was normal in size. Pericardium: There is no evidence of pericardial effusion. Mitral Valve: The mitral valve is normal in structure. Moderate mitral annular calcification. No evidence of mitral valve regurgitation. No evidence of mitral valve stenosis. Tricuspid Valve: The tricuspid valve is normal in structure. Tricuspid valve regurgitation is not demonstrated. No evidence of tricuspid stenosis. Aortic Valve: The aortic valve is grossly normal. There is mild calcification of the aortic valve. There is mild thickening of the aortic valve. Aortic valve regurgitation is not visualized. Mild aortic valve sclerosis is present, with no evidence of aortic valve stenosis. Pulmonic Valve: The pulmonic valve was not well visualized. Pulmonic valve regurgitation not visualized. Aorta: The aortic root, ascending aorta, aortic arch and descending aorta are all structurally normal, with no evidence of dilitation or obstruction. Venous: The inferior vena cava is normal in size with greater than 50% respiratory variability, suggesting right atrial pressure of 3 mmHg. IAS/Shunts: The atrial septum is grossly normal.  LEFT VENTRICLE PLAX 2D LVIDd:         4.70 cm  Diastology LVIDs:         2.80 cm  LV e' medial:  5.33 cm/s LV PW:         0.80 cm  LV e' lateral: 5.87 cm/s LV IVS:        1.00 cm LVOT diam:     2.00 cm LV SV:         56 LV SV Index:   31 LVOT Area:     3.14 cm  LEFT ATRIUM  Index LA diam:      3.50 cm 1.96 cm/m LA Vol (A2C): 48.5 ml 27.14 ml/m  AORTIC VALVE LVOT Vmax:   109.00 cm/s LVOT Vmean:  71.200 cm/s LVOT VTI:    0.179 m  AORTA Ao Root diam: 3.20 cm  SHUNTS Systemic VTI:  0.18 m Systemic Diam: 2.00 cm  Buford Dresser MD Electronically signed by Buford Dresser MD Signature Date/Time: 04/25/2020/12:08:57 PM    Final     Labs: BNP (last 3 results) No results for input(s): BNP in the last 8760 hours. Basic Metabolic Panel: Recent Labs  Lab 04/24/20 1212 04/24/20 2311  NA 137  --   K 3.9  --   CL 105  --   CO2 23  --   GLUCOSE 101*  --   BUN 20  --   CREATININE 0.66  --   CALCIUM 9.3  --   MG  --  2.2   Liver Function Tests: No results for input(s): AST, ALT, ALKPHOS, BILITOT, PROT, ALBUMIN in the last 168 hours. No results for input(s): LIPASE, AMYLASE in the last 168 hours. No results for input(s): AMMONIA in the last 168 hours. CBC: Recent Labs  Lab 04/24/20 1212  WBC 10.1  HGB 14.1  HCT 43.6  MCV 91.8  PLT 311   Cardiac Enzymes: No results for input(s): CKTOTAL, CKMB, CKMBINDEX, TROPONINI in the last 168 hours. BNP: Invalid input(s): POCBNP CBG: No results for input(s): GLUCAP in the last 168 hours. D-Dimer No results for input(s): DDIMER in the last 72 hours. Hgb A1c Recent Labs    04/24/20 2311  HGBA1C 6.0*   Lipid Profile Recent Labs    04/24/20 2311  CHOL 205*  HDL 66  LDLCALC 124*  TRIG 74  CHOLHDL 3.1   Thyroid function studies Recent Labs    04/24/20 2311  TSH 1.397   Anemia work up No results for input(s): VITAMINB12, FOLATE, FERRITIN, TIBC, IRON, RETICCTPCT in the last 72 hours. Urinalysis    Component Value Date/Time   COLORURINE YELLOW 01/13/2020 1044   APPEARANCEUR CLEAR 01/13/2020 1044   LABSPEC 1.016 01/13/2020 1044   PHURINE 5.0 01/13/2020 1044   GLUCOSEU NEGATIVE 01/13/2020 1044   HGBUR NEGATIVE 01/13/2020 1044   [place] 01/13/2020 1044   KETONESUR NEGATIVE 01/13/2020 1044   PROTEINUR NEGATIVE 01/13/2020 1044   NITRITE NEGATIVE 01/13/2020 1044   LEUKOCYTESUR NEGATIVE 01/13/2020 1044   Sepsis Labs Invalid input(s): PROCALCITONIN,  WBC,  LACTICIDVEN Microbiology Recent Results (from the past  240 hour(s))  Resp Panel by RT-PCR (Flu A&B, Covid) Nasopharyngeal Swab     Status: None   Collection Time: 04/24/20  4:41 PM   Specimen: Nasopharyngeal Swab; Nasopharyngeal(NP) swabs in vial transport medium  Result Value Ref Range Status   SARS Coronavirus 2 by RT PCR NEGATIVE NEGATIVE Final    Comment: (NOTE) SARS-CoV-2 target nucleic acids are NOT DETECTED.  The SARS-CoV-2 RNA is generally detectable in upper respiratory specimens during the acute phase of infection. The lowest concentration of SARS-CoV-2 viral copies this assay can detect is 138 copies/mL. A negative result does not preclude SARS-Cov-2 infection and should not be used as the sole basis for treatment or other patient management decisions. A negative result may occur with  improper specimen collection/handling, submission of specimen other than nasopharyngeal swab, presence of viral mutation(s) within the areas targeted by this assay, and inadequate number of viral copies(<138 copies/mL). A negative result must be combined with clinical observations, patient history, and epidemiological information.  The expected result is Negative.  Fact Sheet for Patients:  EntrepreneurPulse.com.au  Fact Sheet for Healthcare Providers:  IncredibleEmployment.be  This test is no t yet approved or cleared by the [place] FDA and  has been authorized for detection and/or diagnosis of SARS-CoV-2 by FDA under an Emergency Use Authorization (EUA). This EUA will remain  in effect (meaning this test can be used) for the duration of the COVID-19 declaration under Section 564(b)(1) of the Act, 21 U.S.C.section 360bbb-3(b)(1), unless the authorization is terminated  or revoked sooner.       Influenza A by PCR NEGATIVE NEGATIVE Final   Influenza B by PCR NEGATIVE NEGATIVE Final    Comment: (NOTE) The Xpert Xpress SARS-CoV-2/FLU/RSV plus assay is intended as an aid in the diagnosis of influenza  from Nasopharyngeal swab specimens and should not be used as a sole basis for treatment. Nasal washings and aspirates are unacceptable for Xpert Xpress SARS-CoV-2/FLU/RSV testing.  Fact Sheet for Patients: EntrepreneurPulse.com.au  Fact Sheet for Healthcare Providers: IncredibleEmployment.be  This test is not yet approved or cleared by the [place] FDA and has been authorized for detection and/or diagnosis of SARS-CoV-2 by FDA under an Emergency Use Authorization (EUA). This EUA will remain in effect (meaning this test can be used) for the duration of the COVID-19 declaration under Section 564(b)(1) of the Act, 21 U.S.C. section 360bbb-3(b)(1), unless the authorization is terminated or revoked.  Performed at [facility], [place] [address], [place], [place] [address]      Time coordinating discharge: 25  minutes  SIGNED: Antonieta Pert, MD  [facility] 04/25/2020, 4:06 PM  If 7PM-7AM, please contact night-coverage [email or web address]

## 2020-04-25 NOTE — Progress Notes (Signed)
  Echocardiogram 2D Echocardiogram has been performed.  Cynthia Rhodes 04/25/2020, 9:06 AM

## 2020-04-25 NOTE — Consult Note (Signed)
Cardiology Consultation:   Patient ID: [name] MRN: [id number]; DOB: Feb 24, 1946  Admit date: 04/24/2020 Date of Consult: 04/25/2020  Primary Care Provider: [name], DO [facility] Cardiologist: Freada Bergeron, MD  [place] Electrophysiologist:  None    Patient Profile:   Cynthia Rhodes is a 74 y.o. 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 Francesco Sor.  History of Present Illness:   [name] presented yesterday with left anterior chest pain that radiates to left neck and shoulder of two days duration. ECG and hsTnI unremarkable. Cardiology asked to evaluate.  She had no prior issues. No heavy lifting. She did have a chiropractor adjustment several days ago and felt tired after. She went to bed early that night, and when she awoke the next morning she had very severe, sharp upper left chest pain that went to her shoulder and upper left arm. Pain was worse with movement.   I personally read her echo, which shows no wall motion abnormalities.  I reviewed her ECGs. Initial ECG shows TWI in V1 and V2. Repeat shows TWI in V1. She had previous TWI in V1 (ECG 03/01/20 as well as prior), so overall not significantly changed. hsTnI has been normal at 5, 5. ESR normal, CRP elevated at 2.0. TSH normal. Tchol not at goal, LDL 124. A1c 6.0.  I spoke to her at length about her symptoms. No associated symptoms. She can feel exactly where the pain is and alter it with pressure. She has been trying heat without much relief. Pain medication is the only thing that helps.  She saw Dr. Johney Frame and was recommended to monitor blood pressure at home. She admits that she has not been doing this. Several of her recent visit numbers are elevated, and her blood pressure has remained significantly elevated during her admission.  Denies  shortness of breath at rest or with normal exertion. No PND, orthopnea, LE edema or unexpected weight gain. No syncope or palpitations.   Past Medical History:  Diagnosis Date  . Cancer ([place]) 2021  . History of hiatal hernia   . Hypothyroidism   . Peripheral vascular disease ([place])    varicose veins    Past Surgical History:  Procedure Laterality Date  . ABDOMINAL HYSTERECTOMY    . APPENDECTOMY  1954  . BRONCHIAL BIOPSY  11/04/2019   Procedure: BRONCHIAL BIOPSIES;  Surgeon: Collene Gobble, MD;  Location: [facility] ENDOSCOPY;  Service: Pulmonary;;  . BRONCHIAL BRUSHINGS  11/04/2019   Procedure: BRONCHIAL BRUSHINGS;  Surgeon: Collene Gobble, MD;  Location: [facility] ENDOSCOPY;  Service: Pulmonary;;  . BRONCHIAL NEEDLE ASPIRATION BIOPSY  11/04/2019   Procedure: BRONCHIAL NEEDLE ASPIRATION BIOPSIES;  Surgeon: Collene Gobble, MD;  Location: [facility] ENDOSCOPY;  Service: Pulmonary;;  . BRONCHIAL WASHINGS  11/04/2019   Procedure: BRONCHIAL WASHINGS;  Surgeon: Collene Gobble, MD;  Location: [facility] ENDOSCOPY;  Service: Pulmonary;;  . BUNIONECTOMY Bilateral   . CHOLECYSTECTOMY    . [place]  . EYE SURGERY Bilateral 2021  . INTERCOSTAL NERVE BLOCK Right 01/15/2020   Procedure: INTERCOSTAL NERVE BLOCK;  Surgeon: Melrose Nakayama, MD;  Location: [place];  Service: Thoracic;  Laterality: Right;  . JOINT REPLACEMENT Bilateral    Knee  . KNEE SURGERY Bilateral   . LYMPH NODE DISSECTION N/A 01/15/2020   Procedure: LYMPH NODE DISSECTION;  Surgeon:  Melrose Nakayama, MD;  Location: [place];  Service: Thoracic;  Laterality: N/A;  . REPLACEMENT TOTAL KNEE BILATERAL    . SIGMOIDECTOMY    . TONSILLECTOMY    . VIDEO BRONCHOSCOPY WITH ENDOBRONCHIAL NAVIGATION N/A 11/04/2019   Procedure: VIDEO BRONCHOSCOPY WITH ENDOBRONCHIAL NAVIGATION;  Surgeon: Collene Gobble, MD;  Location: [place] ENDOSCOPY;  Service: Pulmonary;  Laterality: N/A;  . VIDEO BRONCHOSCOPY WITH ENDOBRONCHIAL NAVIGATION N/A 01/15/2020   Procedure: VIDEO  BRONCHOSCOPY WITH ENDOBRONCHIAL NAVIGATION to mark right upper lobe nodule;  Surgeon: Melrose Nakayama, MD;  Location: [facility] OR;  Service: Thoracic;  Laterality: N/A;     Home Medications:  Prior to Admission medications   Medication Sig Start Date End Date Taking? Authorizing Provider  acetaminophen (TYLENOL) 650 MG CR tablet Take 1,300 mg by mouth every 8 (eight) hours as needed for pain.   Yes [provider]  albuterol (VENTOLIN HFA) 108 (90 Base) MCG/ACT inhaler Inhale 2 puffs into the lungs every 6 (six) hours as needed for wheezing or shortness of breath. 02/12/20  Yes Collene Gobble, MD  aspirin EC 81 MG tablet Take 324 mg by mouth every 6 (six) hours as needed for mild pain or fever. Swallow whole.   Yes [provider]  Calcium Carb-Cholecalciferol (CALCIUM + D3 PO) Take 1 tablet by mouth daily.    Yes [provider]  levothyroxine (SYNTHROID) 50 MCG tablet Take 50 mcg by mouth every other day. Alternating with 75 mcg   Yes [provider]  levothyroxine (SYNTHROID) 75 MCG tablet Take 75 mcg by mouth every other day. Alternating with 50 mcg 09/29/19  Yes [provider]  lidocaine (LIDODERM) 5 % Place 1 patch onto the skin daily as needed (pain). Remove & Discard patch within 12 hours or as directed by MD   Yes [provider]  magnesium oxide (MAG-OX) 400 MG tablet Take 400 mg by mouth daily.   Yes [provider]  Multiple Vitamins-Minerals (OCUVITE EYE HEALTH FORMULA PO) Take 1 tablet by mouth daily.   Yes [provider]  omalizumab Arvid Right) 150 MG/ML prefilled syringe Inject 150 mg into the skin every 28 (twenty-eight) days.   Yes [provider]  OVER THE COUNTER MEDICATION Take 6 capsules by mouth daily. Balance of Nature Fruits & Veggies supplement (3 Veggies + 3 Fruit)   Yes [provider]  polyvinyl alcohol (LIQUIFILM TEARS) 1.4 % ophthalmic solution Place 1 drop into both eyes as needed  for dry eyes.   Yes [provider]  TURMERIC PO Take 1,000 mg by mouth daily.    Yes [provider]    Inpatient Medications: Scheduled Meds: . amLODipine  5 mg Oral Daily  . aspirin EC  81 mg Oral Daily  . atorvastatin  40 mg Oral Daily  . enoxaparin (LOVENOX) injection  40 mg Subcutaneous Q24H  . levothyroxine  50 mcg Oral Q48H  . [START ON 04/26/2020] levothyroxine  75 mcg Oral Q48H  . polyethylene glycol  17 g Oral Daily   Continuous Infusions:  PRN Meds: acetaminophen, albuterol, nitroGLYCERIN, ondansetron (ZOFRAN) IV, oxyCODONE  Allergies:    Allergies  Allergen Reactions  . Codeine Nausea Only and Other (See Comments)    Severe headaches   . Drug [Tape] Hives  . Sulfa Antibiotics Nausea And Vomiting    Social History:   Social History   Socioeconomic History  . Marital status: Widowed    Spouse name: Not on file  . Number  of children: Not on file  . Years of education: Not on file  . Highest education level: Not on file  Occupational History  . Not on file  Tobacco Use  . Smoking status: Never Smoker  . Smokeless tobacco: Never Used  Vaping Use  . Vaping Use: Never used  Substance and Sexual Activity  . Alcohol use: Yes    Alcohol/week: 7.0 standard drinks    Types: 7 Glasses of wine per week  . Drug use: Never  . Sexual activity: Not Currently  Other Topics Concern  . Not on file  Social History Narrative  . Not on file   Social Determinants of Health   Financial Resource Strain:   . Difficulty of Paying Living Expenses: Not on file  Food Insecurity:   . Worried About Charity fundraiser in the Last Year: Not on file  . Ran Out of Food in the Last Year: Not on file  Transportation Needs:   . Lack of Transportation (Medical): Not on file  . Lack of Transportation (Non-Medical): Not on file  Physical Activity:   . Days of Exercise per Week: Not on file  . Minutes of Exercise per Session: Not on file  Stress:   . Feeling of  Stress : Not on file  Social Connections:   . Frequency of Communication with Friends and Family: Not on file  . Frequency of Social Gatherings with Friends and Family: Not on file  . Attends Religious Services: Not on file  . Active Member of Clubs or Organizations: Not on file  . Attends Archivist Meetings: Not on file  . Marital Status: Not on file  Intimate Partner Violence:   . Fear of Current or Ex-Partner: Not on file  . Emotionally Abused: Not on file  . Physically Abused: Not on file  . Sexually Abused: Not on file    Family History:    Family History  Problem Relation Age of Onset  . Asthma Mother   . Breast cancer Mother      ROS:  Please see the history of present illness.  Constitutional: Negative for chills, fever, night sweats, unintentional weight loss  HENT: Negative for ear pain and hearing loss.   Eyes: Negative for loss of vision and eye pain.  Respiratory: Negative for cough, sputum, wheezing.   Cardiovascular: See HPI. Gastrointestinal: Negative for abdominal pain, melena, and hematochezia.  Genitourinary: Negative for dysuria and hematuria.  Musculoskeletal: Negative for falls and myalgias.  Skin: Negative for itching and rash.  Neurological: Negative for focal weakness, focal sensory changes and loss of consciousness.  Endo/Heme/Allergies: Does not bruise/bleed easily.  All other ROS reviewed and negative.     Physical Exam/Data:   Vitals:   04/25/20 1000 04/25/20 1148 04/25/20 1200 04/25/20 1244  BP: (!) 155/84 (!) 148/86 (!) 148/95   Pulse: 78 (!) 105 (!) 107 100  Resp: (!) 24 (!) 24 (!) 24 (!) 25  Temp:  98.7 F (37.1 C)    TempSrc:  Oral    SpO2: 95% 92% 93% 95%  Weight:      Height:        Intake/Output Summary (Last 24 hours) at 04/25/2020 1246 Last data filed at 04/25/2020 1000 Gross per 24 hour  Intake 50 ml  Output --  Net 50 ml   Last 3 Weights 04/24/2020 04/24/2020 04/12/2020  Weight (lbs) 153 lb 7 oz 155 lb 158 lb   Weight (kg) 69.6 kg 70.308 kg 71.668  kg     Body mass index is 24.77 kg/m.  General:  Well nourished, well developed, in no acute distress HEENT: normal Neck: no JVD Endocrine:  No thryomegaly Vascular: No carotid bruits; RA pulses 2+ bilaterally Cardiac:  normal S1, S2; RRR; no murmur Chest: tender to palpation across upper left pectoral region into shoulder, with palpable muscle knots Lungs:  clear to auscultation bilaterally, no wheezing, rhonchi or rales  Abd: soft, nontender, no hepatomegaly  Ext: no edema Musculoskeletal:  No deformities, BUE and BLE strength normal and equal Skin: warm and dry  Neuro:  CNs 2-12 intact, no focal abnormalities noted Psych:  Normal affect   EKG:  The EKG was personally reviewed and demonstrates:  As above, shows sinus rhythm with nonspecific/unchanged TWI lead V1 Telemetry:  Telemetry was personally reviewed and demonstrates:  Sinus rhythm/sinus tachycardia  Relevant CV Studies: Echo 04/25/20 1. Left ventricular ejection fraction, by estimation, is 65 to 70%. The  left ventricle has normal function. The left ventricle has no regional  wall motion abnormalities. Indeterminate diastolic filling due to E-A  fusion.  2. Right ventricular systolic function is normal. The right ventricular  size is normal. Tricuspid regurgitation signal is inadequate for assessing  PA pressure.  3. The mitral valve is normal in structure. No evidence of mitral valve  regurgitation. No evidence of mitral stenosis. Moderate mitral annular  calcification.  4. The aortic valve is grossly normal. There is mild calcification of the  aortic valve. There is mild thickening of the aortic valve. Aortic valve  regurgitation is not visualized. Mild aortic valve sclerosis is present,  with no evidence of aortic valve  stenosis.  5. Pulmonic valve regurgitation not visualized.  6. The inferior vena cava is normal in size with greater than 50%  respiratory variability,  suggesting right atrial pressure of 3 mmHg.   Comparison(s): No prior Echocardiogram.   Laboratory Data:  High Sensitivity Troponin:   Recent Labs  Lab 04/24/20 1212 04/24/20 1456  TROPONINIHS 5 5     Chemistry Recent Labs  Lab 04/24/20 1212  NA 137  K 3.9  CL 105  CO2 23  GLUCOSE 101*  BUN 20  CREATININE 0.66  CALCIUM 9.3  GFRNONAA >60  ANIONGAP 9    No results for input(s): PROT, ALBUMIN, AST, ALT, ALKPHOS, BILITOT in the last 168 hours. Hematology Recent Labs  Lab 04/24/20 1212  WBC 10.1  RBC 4.75  HGB 14.1  HCT 43.6  MCV 91.8  MCH 29.7  MCHC 32.3  RDW 13.3  PLT 311   BNPNo results for input(s): BNP, PROBNP in the last 168 hours.  DDimer No results for input(s): DDIMER in the last 168 hours.   Radiology/Studies:  DG Chest 2 View  Result Date: 04/24/2020 CLINICAL DATA:  Chest pain.  Shortness of breath. EXAM: CHEST - 2 VIEW COMPARISON:  April 12, 2020 FINDINGS: The heart size and mediastinal contours are within normal limits. Both lungs are clear. The visualized skeletal structures are unremarkable. IMPRESSION: No active cardiopulmonary disease. Electronically Signed   By: Dorise Bullion III M.D   On: 04/24/2020 13:04   CT Angio Chest PE W and/or Wo Contrast  Result Date: 04/24/2020 CLINICAL DATA:  74 year old with left shoulder and chest pain. Shortness of breath. Evaluate for pulmonary embolism. History of right middle lobectomy and right upper lobe wedge resection. EXAM: CT ANGIOGRAPHY CHEST WITH CONTRAST TECHNIQUE: Multidetector CT imaging of the chest was performed using the standard protocol during bolus administration  of intravenous contrast. Multiplanar CT image reconstructions and MIPs were obtained to evaluate the vascular anatomy. CONTRAST:  138m OMNIPAQUE IOHEXOL 350 MG/ML SOLN COMPARISON:  10/06/2019 and PET-CT 03/31/2020 FINDINGS: Cardiovascular: Pulmonary arteries are adequately opacified on this examination. No evidence for pulmonary  embolism. Ascending thoracic aorta measures roughly 3.8 cm. Heart size is prominent without significant pericardial fluid. Mediastinum/Nodes: There appears to be new low-density in the right infrahilar region and this could be related to a small amount of fluid. This was not clearly present on the recent PET-CT. No significant lymph node enlargement. No evidence for axillary lymph node enlargement. Lungs/Pleura: Trachea and mainstem bronchi are patent. Patient has undergone a right middle lobectomy. The low-density material or fluid in the right hilar region is associated with the lobectomy site. There is a nodular structure along the right major fissure on sequence 6, image 41 that measures roughly 4 mm and not significantly changed from the recent PET-CT. Postoperative changes along the medial aspect of the right upper lobe. Tiny nodule in the right lower lobe on sequence 6, image 70 measures 3 mm and similar to the recent PET-CT. No significant pleural fluid. Patchy densities in the left lower lobe are suggestive for atelectasis. 2 mm peripheral nodule in the lingula on sequence 6 image 81 is stable. No significant airspace disease or consolidation in the lungs. Upper Abdomen: Low density areas in the upper kidneys may be related to parapelvic cysts and similar to the recent PET-CT. 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 Mild fullness of the central intrahepatic bile ducts are similar to the prior chest CT. Overall, no acute abnormalities in the upper abdomen. Musculoskeletal: No acute bone abnormality. Review of the MIP images confirms the above findings. IMPRESSION: 1. Negative for pulmonary embolism. 2. Postsurgical changes in right lung with a small amount of new fluid or low-density in the right hilar region adjacent to the right middle lobectomy site. This fluid or low-density is nonspecific but appears to be new since 03/31/2020. Consider cardiothoracic consultation with regards to  this finding. 3. No acute disease in the lungs. Stable small pulmonary nodules. Recommend attention to the small nodules on follow-up imaging. Electronically Signed   By: AMarkus DaftM.D.   On: 04/24/2020 15:56   ECHOCARDIOGRAM COMPLETE  Result Date: 04/25/2020    ECHOCARDIOGRAM REPORT   Patient Name:   MVITALIA STOUGHDate of Exam: 04/25/2020 Medical Rec #:  [id number]       Height:       66.0 in Accession #:    [id number]      Weight:       153.4 lb Date of Birth:  1Feb 15, 1947       BSA:          1.787 m Patient Age:    89years         BP:           148/90 mmHg Patient Gender: F                HR:           96 bpm. Exam Location:  Inpatient Procedure: 2D Echo Indications:   chest pain  History:       Patient has no prior history of Echocardiogram examinations.                Cancer.  Sonographer:   VJannett CelestineRDCS (AE) Referring      [id number] Margarita  Phys: IMPRESSIONS  1. Left ventricular ejection fraction, by estimation, is 65 to 70%. The left ventricle has normal function. The left ventricle has no regional wall motion abnormalities. Indeterminate diastolic filling due to E-A fusion.  2. Right ventricular systolic function is normal. The right ventricular size is normal. Tricuspid regurgitation signal is inadequate for assessing PA pressure.  3. The mitral valve is normal in structure. No evidence of mitral valve regurgitation. No evidence of mitral stenosis. Moderate mitral annular calcification.  4. The aortic valve is grossly normal. There is mild calcification of the aortic valve. There is mild thickening of the aortic valve. Aortic valve regurgitation is not visualized. Mild aortic valve sclerosis is present, with no evidence of aortic valve stenosis.  5. Pulmonic valve regurgitation not visualized.  6. The inferior vena cava is normal in size with greater than 50% respiratory variability, suggesting right atrial pressure of 3 mmHg. Comparison(s): No prior Echocardiogram.  Conclusion(s)/Recommendation(s): Otherwise normal echocardiogram, with minor abnormalities described in the report. FINDINGS  Left Ventricle: Left ventricular ejection fraction, by estimation, is 65 to 70%. The left ventricle has normal function. The left ventricle has no regional wall motion abnormalities. The left ventricular internal cavity size was normal in size. There is  no left ventricular hypertrophy. Indeterminate diastolic filling due to E-A fusion. Right Ventricle: The right ventricular size is normal. No increase in right ventricular wall thickness. Right ventricular systolic function is normal. Tricuspid regurgitation signal is inadequate for assessing PA pressure. Left Atrium: Left atrial size was normal in size. Right Atrium: Right atrial size was normal in size. Pericardium: There is no evidence of pericardial effusion. Mitral Valve: The mitral valve is normal in structure. Moderate mitral annular calcification. No evidence of mitral valve regurgitation. No evidence of mitral valve stenosis. Tricuspid Valve: The tricuspid valve is normal in structure. Tricuspid valve regurgitation is not demonstrated. No evidence of tricuspid stenosis. Aortic Valve: The aortic valve is grossly normal. There is mild calcification of the aortic valve. There is mild thickening of the aortic valve. Aortic valve regurgitation is not visualized. Mild aortic valve sclerosis is present, with no evidence of aortic valve stenosis. Pulmonic Valve: The pulmonic valve was not well visualized. Pulmonic valve regurgitation not visualized. Aorta: The aortic root, ascending aorta, aortic arch and descending aorta are all structurally normal, with no evidence of dilitation or obstruction. Venous: The inferior vena cava is normal in size with greater than 50% respiratory variability, suggesting right atrial pressure of 3 mmHg. IAS/Shunts: The atrial septum is grossly normal.  LEFT VENTRICLE PLAX 2D LVIDd:         4.70 cm  Diastology  LVIDs:         2.80 cm  LV e' medial:  5.33 cm/s LV PW:         0.80 cm  LV e' lateral: 5.87 cm/s LV IVS:        1.00 cm LVOT diam:     2.00 cm LV SV:         56 LV SV Index:   31 LVOT Area:     3.14 cm  LEFT ATRIUM           Index LA diam:      3.50 cm 1.96 cm/m LA Vol (A2C): 48.5 ml 27.14 ml/m  AORTIC VALVE LVOT Vmax:   109.00 cm/s LVOT Vmean:  71.200 cm/s LVOT VTI:    0.179 m  AORTA Ao Root diam: 3.20 cm  SHUNTS Systemic VTI:  0.18 m  Systemic Diam: 2.00 cm Buford Dresser MD Electronically signed by Buford Dresser MD Signature Date/Time: 04/25/2020/12:08:57 PM    Final      Assessment and Plan:   Chest pain -ECG unchanged, hsTnI unremarkable -Echo normal -reproducible with palpation -low suspicion for cardiac etiology -no requirement for further testing as an inpatient. HEAR Score (for undifferentiated chest pain):  HEAR Score: 3  -recommend intermittent heat to area, consider muscle relaxer or other agent especially at bedtime, will defer to primary team  Aortic atherosclerosis Hyperlipidemia -continue aspirin 81 mg -goal LDL <70, is elevated based on labs -started on atorvastatin 40 mg daily while admitted.  -recheck as an outpatient  New diagnosis of hypertension -blood pressures as high as 194/92 this admission, range has been elevated -recommend starting amlodipine, she is agreeable to this -overall she would like to avoid medications, willing to follow up in the office to see if she can stop in the future  [facility] will sign off.   Medication Recommendations:  Continue amlodipine, atorvastatin started this admission Other recommendations (labs, testing, etc):  Recheck lipids as an outpatient Follow up as an outpatient:  I will request a follow up with Dr. Johney Frame as an outpatient to evaluate her response to medications.  For questions or updates, please contact [place] Please consult [email or web address] for contact info under    Signed, Buford Dresser, MD  04/25/2020 12:46 PM

## 2020-04-25 NOTE — Progress Notes (Signed)
Pt and husband given and explained discharge instructions. All questions were answered. MD advised this RN to explain to pt regarding using OTC biofreeze, ibuprofen, or salon pas for pain relief topically. Pt understood. Pt dressed in personal clothing. IV removed. Pt taken to main entrance via wheelchair.

## 2020-04-25 NOTE — Progress Notes (Deleted)
PROGRESS NOTE    Cynthia Rhodes  XIP:[id number] DOB: 07-26-1945 DOA: 04/24/2020 PCP: [name], DO   Chief Complaint  Patient presents with  . 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 [name] and currently on observation presented to the ED with chest pain 2/4.   As per the report "Pt stated that on Friday morning she woke up with left chest pain that radiates to left neck and shoulder, it feels better when she leans forward, never happenned before.  But it is reproducible.  She does feel like it is worst somewhat when she was walking.  She mentioned to the ED staff that she had her back adjusted and denies any shingles.  Never tried NTG, took ASA325mg  earlier".  Of note, aortic atherosclerosis was seen on the CT scan In the ED, hemodynamically stable troponin 5>5 wbc 10.1, hgb 14.1, Na 137, K 3.9, Ca 9.3 EKG showed v1 and v2 inverted twaves Chest x-ray negative, CTA showed  "Negative for pulmonary embolism, with posturigcal changes on the right with asmall amoutn of new fluid or low density and is nonspecific, probably needs follow up imaging as below for tiny nodules. Aorta looks okay Cardiology consulted patient was admitted for chest pain evaluation  Subjective: Seen this morning. Complains of pain on the left chest and left shoulder area,tender to touch,ice pack in place  BP-stable overnight.  Afebrile. Received nitroglycerin x3 yesterday along with oxycodone  Assessment & Plan:  Chest pain: With negative troponin, EKG with T wave changes.  Extensive work-up in the ED negative for PE.  Admitted for heart score of 4.  Further plan to cardiology consultation, echocardiogram, daily aspirin, Lipitor 40 mg.  LDL 124 poorly controlled.  Hemoglobin A1c shows prediabetes ~6.0.  Pain  is reproducible with palpation and also worsened with deep breath-w/ some component of musculoskeletal, can try as needed Tylenol/tramadol  PVD/Artic atherosclerosis: Aspirin 81 mg and Lipitor 40 mg.  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 [name] and currently on observation  Prediabetes hemoglobin 6.0, dietary education and counseling Hypothyroidism on Synthroid, euthyroid TSH 1.3  Nutrition: Diet Order            Diet NPO time specified Except for: Sips with Meds  Diet effective midnight                Body mass index is 24.77 kg/m.  DVT prophylaxis: enoxaparin (LOVENOX) injection 40 mg Start: 04/25/20 1000 Place TED hose Start: 04/24/20 2218 Code Status:   Code Status: Full Code  Family Communication: plan of care discussed with patient at bedside.  Status is: Observation Remains hospitalized for ongoing management of chest pain with further evaluation [place] consultation  Dispo: The patient is from: Home              Anticipated d/c is to: Home              Anticipated d/c date is: 1 day once cleared by cardiology and pain is controlled.  Likely Monday              Patient currently is not medically stable to d/c.   Consultants:see note  Procedures:see note  Culture/Microbiology    Component Value Date/Time   SDES BRONCHIAL ALVEOLAR LAVAGE RML  11/04/2019 [place] C 11/04/2019 0853   CULT  11/04/2019 0853    NO GROWTH 2 DAYS Performed at [facility], [place] [address] [place], [place] [address]    REPTSTATUS 11/06/2019 FINAL 11/04/2019 0853    Other culture-see note  Medications: Scheduled Meds: . aspirin EC  81 mg Oral Daily  . atorvastatin  40 mg Oral Daily  . enoxaparin (LOVENOX) injection  40 mg Subcutaneous Q24H  . levothyroxine  50 mcg Oral Q48H  . [START ON 04/26/2020] levothyroxine  75 mcg Oral Q48H  . polyethylene glycol  17  g Oral Daily   Continuous Infusions:  Antimicrobials: Anti-infectives (From admission, onward)   None     Objective: Vitals: Today's Vitals   04/24/20 2202 04/25/20 0000 04/25/20 0400 04/25/20 0500  BP:  132/71 130/70   Pulse:  80 82 79  Resp:  20 18 18   Temp: 98.8 F (37.1 C) 99.8 F (37.7 C) 99.3 F (37.4 C)   TempSrc: Oral Oral Oral   SpO2:  93% 93% 93%  Weight:      Height:      PainSc:       No intake or output data in the 24 hours ending 04/25/20 0750 Filed Weights   04/24/20 1146 04/24/20 1754  Weight: 70.3 kg 69.6 kg   Weight change:   Intake/Output from previous day: No intake/output data recorded. Intake/Output this shift: No intake/output data recorded.  Examination: General exam: AAOx3 ,NAD, weak appearing. HEENT:Oral mucosa moist, Ear/Nose WNL grossly,dentition normal. Respiratory system: bilaterally clear, tender left chest wall ,no use of accessory muscle, non tender. Cardiovascular system: S1 & S2 +, regular, No JVD. Gastrointestinal system: Abdomen soft, NT,ND, BS+. Nervous System:Alert, awake, moving extremities and grossly nonfocal Extremities: No edema, distal peripheral pulses palpable.  Skin: No rashes,no icterus. MSK: Normal muscle bulk,tone, power  Data Reviewed: I have personally reviewed following labs and imaging studies CBC: Recent Labs  Lab 04/24/20 1212  WBC 10.1  HGB 14.1  HCT 43.6  MCV 91.8  PLT 778   Basic Metabolic Panel: Recent Labs  Lab 04/24/20 1212 04/24/20 2311  NA 137  --   K 3.9  --   CL 105  --   CO2 23  --   GLUCOSE 101*  --   BUN 20  --   CREATININE 0.66  --   CALCIUM 9.3  --   MG  --  2.2   GFR: Estimated Creatinine Clearance: 56.9 mL/min (by C-G formula based on SCr of 0.66 mg/dL). Liver Function Tests: No results for input(s): AST, ALT, ALKPHOS, BILITOT, PROT, ALBUMIN in the last 168 hours. No results for input(s): LIPASE, AMYLASE in the last 168 hours. No results for input(s): AMMONIA in the  last 168 hours. Coagulation Profile: No results for input(s): INR, PROTIME in the last 168 hours. Cardiac Enzymes: No results for input(s): CKTOTAL, CKMB, CKMBINDEX, TROPONINI in the last 168 hours. BNP (last 3 results) No results for input(s): PROBNP in the last 8760 hours. HbA1C: Recent Labs    04/24/20 2311  HGBA1C 6.0*   CBG: No results for input(s): GLUCAP in the last 168 hours. Lipid Profile: Recent Labs    04/24/20 2311  CHOL 205*  HDL 66  LDLCALC 124*  TRIG 74  CHOLHDL 3.1   Thyroid Function Tests: Recent Labs    04/24/20 2311  TSH 1.397   Anemia Panel: No results for input(s): VITAMINB12, FOLATE, FERRITIN, TIBC, IRON, RETICCTPCT in the  last 72 hours. Sepsis Labs: No results for input(s): PROCALCITON, LATICACIDVEN in the last 168 hours.  Recent Results (from the past 240 hour(s))  Resp Panel by RT-PCR (Flu A&B, Covid) Nasopharyngeal Swab     Status: None   Collection Time: 04/24/20  4:41 PM   Specimen: Nasopharyngeal Swab; Nasopharyngeal(NP) swabs in vial transport medium  Result Value Ref Range Status   SARS Coronavirus 2 by RT PCR NEGATIVE NEGATIVE Final    Comment: (NOTE) SARS-CoV-2 target nucleic acids are NOT DETECTED.  The SARS-CoV-2 RNA is generally detectable in upper respiratory specimens during the acute phase of infection. The lowest concentration of SARS-CoV-2 viral copies this assay can detect is 138 copies/mL. A negative result does not preclude SARS-Cov-2 infection and should not be used as the sole basis for treatment or other patient management decisions. A negative result may occur with  improper specimen collection/handling, submission of specimen other than nasopharyngeal swab, presence of viral mutation(s) within the areas targeted by this assay, and inadequate number of viral copies(<138 copies/mL). A negative result must be combined with clinical observations, patient history, and epidemiological information. The expected result  is Negative.  Fact Sheet for Patients:  EntrepreneurPulse.com.au  Fact Sheet for Healthcare Providers:  IncredibleEmployment.be  This test is no t yet approved or cleared by the [place] FDA and  has been authorized for detection and/or diagnosis of SARS-CoV-2 by FDA under an Emergency Use Authorization (EUA). This EUA will remain  in effect (meaning this test can be used) for the duration of the COVID-19 declaration under Section 564(b)(1) of the Act, 21 U.S.C.section 360bbb-3(b)(1), unless the authorization is terminated  or revoked sooner.       Influenza A by PCR NEGATIVE NEGATIVE Final   Influenza B by PCR NEGATIVE NEGATIVE Final    Comment: (NOTE) The Xpert Xpress SARS-CoV-2/FLU/RSV plus assay is intended as an aid in the diagnosis of influenza from Nasopharyngeal swab specimens and should not be used as a sole basis for treatment. Nasal washings and aspirates are unacceptable for Xpert Xpress SARS-CoV-2/FLU/RSV testing.  Fact Sheet for Patients: EntrepreneurPulse.com.au  Fact Sheet for Healthcare Providers: IncredibleEmployment.be  This test is not yet approved or cleared by the [place] FDA and has been authorized for detection and/or diagnosis of SARS-CoV-2 by FDA under an Emergency Use Authorization (EUA). This EUA will remain in effect (meaning this test can be used) for the duration of the COVID-19 declaration under Section 564(b)(1) of the Act, 21 U.S.C. section 360bbb-3(b)(1), unless the authorization is terminated or revoked.  Performed at [facility], [place] [address] [place], [place] [address]      Radiology Studies: DG Chest 2 View  Result Date: 04/24/2020 CLINICAL DATA:  Chest pain.  Shortness of breath. EXAM: CHEST - 2 VIEW COMPARISON:  April 12, 2020 FINDINGS: The heart size and mediastinal contours are within normal limits. Both lungs are clear.  The visualized skeletal structures are unremarkable. IMPRESSION: No active cardiopulmonary disease. Electronically Signed   By: Dorise Bullion III M.D   On: 04/24/2020 13:04   CT Angio Chest PE W and/or Wo Contrast  Result Date: 04/24/2020 CLINICAL DATA:  74 year old with left shoulder and chest pain. Shortness of breath. Evaluate for pulmonary embolism. History of right middle lobectomy and right upper lobe wedge resection. EXAM: CT ANGIOGRAPHY CHEST WITH CONTRAST TECHNIQUE: Multidetector CT imaging of the chest was performed using the standard protocol during bolus administration of intravenous contrast. Multiplanar CT image reconstructions and MIPs were obtained  to evaluate the vascular anatomy. CONTRAST:  163mL OMNIPAQUE IOHEXOL 350 MG/ML SOLN COMPARISON:  10/06/2019 and PET-CT 03/31/2020 FINDINGS: Cardiovascular: Pulmonary arteries are adequately opacified on this examination. No evidence for pulmonary embolism. Ascending thoracic aorta measures roughly 3.8 cm. Heart size is prominent without significant pericardial fluid. Mediastinum/Nodes: There appears to be new low-density in the right infrahilar region and this could be related to a small amount of fluid. This was not clearly present on the recent PET-CT. No significant lymph node enlargement. No evidence for axillary lymph node enlargement. Lungs/Pleura: Trachea and mainstem bronchi are patent. Patient has undergone a right middle lobectomy. The low-density material or fluid in the right hilar region is associated with the lobectomy site. There is a nodular structure along the right major fissure on sequence 6, image 41 that measures roughly 4 mm and not significantly changed from the recent PET-CT. Postoperative changes along the medial aspect of the right upper lobe. Tiny nodule in the right lower lobe on sequence 6, image 70 measures 3 mm and similar to the recent PET-CT. No significant pleural fluid. Patchy densities in the left lower lobe are  suggestive for atelectasis. 2 mm peripheral nodule in the lingula on sequence 6 image 81 is stable. No significant airspace disease or consolidation in the lungs. Upper Abdomen: Low density areas in the upper kidneys may be related to parapelvic cysts and similar to the recent PET-CT. 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 Mild fullness of the central intrahepatic bile ducts are similar to the prior chest CT. Overall, no acute abnormalities in the upper abdomen. Musculoskeletal: No acute bone abnormality. Review of the MIP images confirms the above findings. IMPRESSION: 1. Negative for pulmonary embolism. 2. Postsurgical changes in right lung with a small amount of new fluid or low-density in the right hilar region adjacent to the right middle lobectomy site. This fluid or low-density is nonspecific but appears to be new since 03/31/2020. Consider cardiothoracic consultation with regards to this finding. 3. No acute disease in the lungs. Stable small pulmonary nodules. Recommend attention to the small nodules on follow-up imaging. Electronically Signed   By: Markus Daft M.D.   On: 04/24/2020 15:56     LOS: 0 days   Antonieta Pert, MD [facility]  04/25/2020, 7:50 AM

## 2020-04-26 NOTE — Progress Notes (Signed)
Cardiology Office Note:    Date:  04/27/2020   ID:  Cynthia Rhodes, DOB Sep 19, 1945, MRN [id number]  PCP:  [name], DO  [facility] Cardiologist:  Freada Bergeron, MD  [facility] Electrophysiologist:  None   Referring MD: [name], DO    History of Present Illness:    Cynthia Rhodes is a 74 y.o. 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  During our last visit in 03/01/20, she was seen for incidental asymptomatic bradycardia. She was not on nodal agents at that time and we decided to continue to monitor.   She had recent admission to [facility] with left anterior chest pain radiating to left neck and shoulder x2 days after going to the chiropracter. She presented to the ED as the pain was severe 9/10. There,  Initial ECG with TWI in V1 and V2. Repeat showed TWI in V1. She had previous TWI in V1 (ECG 03/01/20 as well as prior), so overall not significantly changed. hsTnI normal at 5-->5. ESR normal, CRP elevated at 2.0. TSH normal. Tchol not at goal, LDL 124. A1c 6.0. TTE with normal BiV function. Thought to be noncardiac in nature as pain reproducible with palpation. She was recommended for symptomatic management and discharged home. Of note, she had very elevated blood pressure and was started on amlodipine prior to discharge  Today, the patient states that her pain is much better. She has been dealing with constipation but otherwise no SOB, lightheadedness, dizziness, syncope, orthopnea or PND. Her blood pressure is much better controlled in 110s.     Past Medical History:  Diagnosis Date  . Cancer ([place]) 2021  . History of hiatal hernia   . Hypothyroidism   . Peripheral vascular disease ([place])    varicose veins    Past Surgical History:  Procedure Laterality Date  . ABDOMINAL HYSTERECTOMY    .  APPENDECTOMY  1954  . BRONCHIAL BIOPSY  11/04/2019   Procedure: BRONCHIAL BIOPSIES;  Surgeon: Collene Gobble, MD;  Location: [facility] ENDOSCOPY;  Service: Pulmonary;;  . BRONCHIAL BRUSHINGS  11/04/2019   Procedure: BRONCHIAL BRUSHINGS;  Surgeon: Collene Gobble, MD;  Location: [facility] ENDOSCOPY;  Service: Pulmonary;;  . BRONCHIAL NEEDLE ASPIRATION BIOPSY  11/04/2019   Procedure: BRONCHIAL NEEDLE ASPIRATION BIOPSIES;  Surgeon: Collene Gobble, MD;  Location: [facility] ENDOSCOPY;  Service: Pulmonary;;  . BRONCHIAL WASHINGS  11/04/2019   Procedure: BRONCHIAL WASHINGS;  Surgeon: Collene Gobble, MD;  Location: [facility] ENDOSCOPY;  Service: Pulmonary;;  . BUNIONECTOMY Bilateral   . CHOLECYSTECTOMY    . [place]  . EYE SURGERY Bilateral 2021  . INTERCOSTAL NERVE BLOCK Right 01/15/2020   Procedure: INTERCOSTAL NERVE BLOCK;  Surgeon: Melrose Nakayama, MD;  Location: [place];  Service: Thoracic;  Laterality: Right;  . JOINT REPLACEMENT Bilateral    Knee  . KNEE SURGERY Bilateral   . LYMPH NODE DISSECTION N/A 01/15/2020   Procedure: LYMPH NODE DISSECTION;  Surgeon: Melrose Nakayama, MD;  Location: [place];  Service: Thoracic;  Laterality: N/A;  . REPLACEMENT TOTAL KNEE BILATERAL    . SIGMOIDECTOMY    . TONSILLECTOMY    . VIDEO BRONCHOSCOPY WITH ENDOBRONCHIAL NAVIGATION N/A 11/04/2019   Procedure: VIDEO BRONCHOSCOPY WITH ENDOBRONCHIAL NAVIGATION;  Surgeon: Collene Gobble, MD;  Location: [place] ENDOSCOPY;  Service: Pulmonary;  Laterality: N/A;  .  VIDEO BRONCHOSCOPY WITH ENDOBRONCHIAL NAVIGATION N/A 01/15/2020   Procedure: VIDEO BRONCHOSCOPY WITH ENDOBRONCHIAL NAVIGATION to mark right upper lobe nodule;  Surgeon: Melrose Nakayama, MD;  Location[facility];  Service: Thoracic;  Laterality: N/A;    Current Medications: Current Meds  Medication Sig  . acetaminophen (TYLENOL) 650 MG CR tablet Take 1,300 mg by mouth every 8 (eight) hours as needed for pain.  [name] albuterol (VENTOLIN HFA) 108 (90 Base) MCG/ACT inhaler  Inhale 2 puffs into the lungs every 6 (six) hours as needed for wheezing or shortness of breath.  [name] amLODipine (NORVASC) 5 MG tablet Take 1 tablet (5 mg total) by mouth daily. Hold for systolic blood pressure less than 120.  [name] aspirin EC 81 MG EC tablet Take 1 tablet (81 mg total) by mouth daily. Swallow whole.  [name] atorvastatin (LIPITOR) 40 MG tablet Take 1 tablet (40 mg total) by mouth daily.  . Calcium Carb-Cholecalciferol (CALCIUM + D3 PO) Take 1 tablet by mouth daily.   [name] levothyroxine (SYNTHROID) 50 MCG tablet Take 50 mcg by mouth every other day. Alternating with 75 mcg  . levothyroxine (SYNTHROID) 75 MCG tablet Take 75 mcg by mouth every other day. Alternating with 50 mcg  . lidocaine (LIDODERM) 5 % Place 1 patch onto the skin daily as needed (pain). Remove & Discard patch within 12 hours or as directed by MD  . magnesium oxide (MAG-OX) 400 MG tablet Take 400 mg by mouth daily.  . Multiple Vitamins-Minerals (OCUVITE EYE HEALTH FORMULA PO) Take 1 tablet by mouth daily.  [name] omalizumab Arvid Right) 150 MG/ML prefilled syringe Inject 150 mg into the skin every 28 (twenty-eight) days.  [name] OVER THE COUNTER MEDICATION Take 6 capsules by mouth daily. Balance of Nature Fruits & Veggies supplement (3 Veggies + 3 Fruit)  . polyvinyl alcohol (LIQUIFILM TEARS) 1.4 % ophthalmic solution Place 1 drop into both eyes as needed for dry eyes.  . traMADol (ULTRAM) 50 MG tablet Take 1 tablet (50 mg total) by mouth every 8 (eight) hours as needed for up to 10 doses for severe pain.  . TURMERIC PO Take 1,000 mg by mouth daily.      Allergies:   Codeine, Drug [tape], Sulfa antibiotics, and Voltaren [diclofenac sodium]   Social History   Socioeconomic History  . Marital status: Widowed    Spouse name: Not on file  . Number of children: Not on file  . Years of education: Not on file  . Highest education level: Not on file  Occupational History  . Not on file  Tobacco Use  . Smoking status: Never Smoker  .  Smokeless tobacco: Never Used  Vaping Use  . Vaping Use: Never used  Substance and Sexual Activity  . Alcohol use: Yes    Alcohol/week: 7.0 standard drinks    Types: 7 Glasses of wine per week  . Drug use: Never  . Sexual activity: Not Currently  Other Topics Concern  . Not on file  Social History Narrative  . Not on file   Social Determinants of Health   Financial Resource Strain:   . Difficulty of Paying Living Expenses: Not on file  Food Insecurity:   . Worried About Charity fundraiser in the Last Year: Not on file  . Ran Out of Food in the Last Year: Not on file  Transportation Needs:   . Lack of Transportation (Medical): Not on file  . Lack of Transportation (Non-Medical): Not on file  Physical Activity:   .  Days of Exercise per Week: Not on file  . Minutes of Exercise per Session: Not on file  Stress:   . Feeling of Stress : Not on file  Social Connections:   . Frequency of Communication with Friends and Family: Not on file  . Frequency of Social Gatherings with Friends and Family: Not on file  . Attends Religious Services: Not on file  . Active Member of Clubs or Organizations: Not on file  . Attends Archivist Meetings: Not on file  . Marital Status: Not on file     Family History: The patient's family history includes Asthma in her mother; Breast cancer in her mother.  ROS:   Please see the history of present illness.    Review of Systems  Constitutional: Negative for chills and fever.  HENT: Negative for hearing loss.   Eyes: Negative for blurred vision.  Respiratory: Negative for shortness of breath.   Cardiovascular: Negative for chest pain, palpitations, orthopnea, claudication, leg swelling and PND.  Gastrointestinal: Positive for constipation. Negative for nausea and vomiting.  Genitourinary: Negative for hematuria.  Musculoskeletal: Positive for back pain and myalgias.  Neurological: Negative for dizziness and loss of consciousness.   Endo/Heme/Allergies: Negative for polydipsia.  Psychiatric/Behavioral: Negative for depression.    EKGs/Labs/Other Studies Reviewed:    The following studies were reviewed today: CT chest 09/2019: IMPRESSION: 1. 2.8 x 1.6 cm spiculated mass with pleural tail is noted in the right middle lobe consistent with primary malignancy. PET scan is recommended for further evaluation. These results will be called to the ordering clinician or representative by the Radiologist Assistant, and communication documented in the PACS or zVision Dashboard. 2. 7 x 5 mm nodule is noted in right upper lobe concerning for metastatic lesion. 3. Mild cardiomegaly.  ECG 01/13/20: Sinus braydcardia; no block.  Echo 04/25/20 1. Left ventricular ejection fraction, by estimation, is 65 to 70%. The  left ventricle has normal function. The left ventricle has no regional  wall motion abnormalities. Indeterminate diastolic filling due to E-A  fusion.  2. Right ventricular systolic function is normal. The right ventricular  size is normal. Tricuspid regurgitation signal is inadequate for assessing  PA pressure.  3. The mitral valve is normal in structure. No evidence of mitral valve  regurgitation. No evidence of mitral stenosis. Moderate mitral annular  calcification.  4. The aortic valve is grossly normal. There is mild calcification of the  aortic valve. There is mild thickening of the aortic valve. Aortic valve  regurgitation is not visualized. Mild aortic valve sclerosis is present,  with no evidence of aortic valve  stenosis.  5. Pulmonic valve regurgitation not visualized.  6. The inferior vena cava is normal in size with greater than 50%  respiratory variability, suggesting right atrial pressure of 3 mmHg.   Comparison(s): No prior Echocardiogram  CTA chest 04/24/20: IMPRESSION: 1. Negative for pulmonary embolism. 2. Postsurgical changes in right lung with a small amount of new fluid or  low-density in the right hilar region adjacent to the right middle lobectomy site. This fluid or low-density is nonspecific but appears to be new since 03/31/2020. Consider cardiothoracic consultation with regards to this finding. 3. No acute disease in the lungs. Stable small pulmonary nodules. Recommend attention to the small nodules on follow-up imaging.  EKG:  EKG is 04/26/20.  The ekg ordered today demonstrates NSR with TWI in V1 (stable)  Recent Labs: 02/10/2020: ALT 30 04/24/2020: BUN 20; Creatinine, Ser 0.66; Hemoglobin 14.1; Magnesium 2.2;  Platelets 311; Potassium 3.9; Sodium 137; TSH 1.397  Recent Lipid Panel    Component Value Date/Time   CHOL 205 (H) 04/24/2020 2311   TRIG 74 04/24/2020 2311   HDL 66 04/24/2020 2311   CHOLHDL 3.1 04/24/2020 2311   VLDL 15 04/24/2020 2311   LDLCALC 124 (H) 04/24/2020 2311      Physical Exam:    VS:  BP 114/70   Pulse 93   Ht _0  (1.676 m)   Wt 155 lb 6.4 oz (70.5 kg)   SpO2 97%   BMI 25.08 kg/m     Wt Readings from Last 3 Encounters:  04/27/20 155 lb 6.4 oz (70.5 kg)  04/24/20 153 lb 7 oz (69.6 kg)  04/12/20 158 lb (71.7 kg)     GEN:  Well nourished, well developed in no acute distress HEENT: Normal NECK: No JVD; No carotid bruits CARDIAC: RRR, no murmurs, rubs, gallops RESPIRATORY:  Clear to auscultation without rales, wheezing or rhonchi  ABDOMEN: Soft, non-tender, non-distended MUSCULOSKELETAL:  No edema; No deformity  SKIN: Warm and dry NEUROLOGIC:  Alert and oriented x 3 PSYCHIATRIC:  Normal affect   ASSESSMENT:    1. Chest pain of uncertain etiology   2. Hyperlipidemia, unspecified hyperlipidemia type   3. Bradycardia   4. Primary hypertension    PLAN:    In order of problems listed above:  #Noncardiac Chest Pain: Developed after having work done by her chiropractor. Presented to [facility] ED with neck and shoulder pain. Cardiac work-up including trops, ECG and TTE unremarkable. Pain thought to be MSK now  improving. -Continue symptomatic management as not cardiac in nature -TTE with normal BiV function, no significant valvular abnormalities -ECG without ischemia -Trop negative  #Asymptomatic Sinus bradycardia: Patient with asymptomatic bradycardia with HR 50-60s. No evidence of conduction disease on ECG. Denies chest pain, lightheadedness, dizziness, SOB or syncope. Able to exert herself without symptoms. Very active at baseline. Not on nodal agents or herbal supplements. -Reassuringly asymptomatic without evidence of block -Not on nodal agents -TSH normal on current synthroid dosing -Will continue to monitor but no intervention needed from CV standpoint  #Hypertension Elevated in the hospital and started on amlodipine 39m daily with improvement -Improved on amlodipine 516mdaily--continue -Continue healthy diet and exercise with goal 150 minutes of moderate exercise per week  #HLD: -Started on atorvastatin 4031maily in the hosptial -Repeat cholesterol panel in 6weeks  #RUL and RML carcinoid: S/p resection in 12/2019. -Follow-up with pulm as scheduled -CTA chest in ED shows fluid or low-density in the right hilar region adjacent to the right middle lobectomy site. This is nonspecific but appears to be new since 03/31/2020--told patient to follow-up with pulm to ensure no further work-up/imaging needed   #Constipation: -Miralax BID -Colace BID -Can use bisacodyl prn -Increase fluid intake    Medication Adjustments/Labs and Tests Ordered: Current medicines are reviewed at length with the patient today.  Concerns regarding medicines are outlined above.  Orders Placed This Encounter  Procedures  . Lipid Profile   No orders of the defined types were placed in this encounter.   Patient Instructions  Medication Instructions:  Your physician recommends that you continue on your current medications as directed. Please refer to the Current Medication list given to you  today.  *If you need a refill on your cardiac medications before your next appointment, please call your pharmacy*   Lab Work: Your physician recommends that you return for lab work on January 18,2022.  Lipid  profile.  This will be fasting.  You can come in anytime after 7:30 AM  If you have labs (blood work) drawn today and your tests are completely normal, you will receive your results only by: [name] MyChart Message (if you have MyChart) OR . A paper copy in the mail If you have any lab test that is abnormal or we need to change your treatment, we will call you to review the results.   Testing/Procedures: none   Follow-Up: At [facility], you and your health needs are our priority.  As part of our continuing mission to provide you with exceptional heart care, we have created designated Provider Care Teams.  These Care Teams include your primary Cardiologist (physician) and Advanced Practice Providers (APPs -  Physician Assistants and Nurse Practitioners) who all work together to provide you with the care you need, when you need it.  We recommend signing up for the patient portal called "MyChart".  Sign up information is provided on this After Visit Summary.  MyChart is used to connect with patients for Virtual Visits (Telemedicine).  Patients are able to view lab/test results, encounter notes, upcoming appointments, etc.  Non-urgent messages can be sent to your provider as well.   To learn more about what you can do with MyChart, go to [email or web address].    Your next appointment:   June 7,2022 at 9:00  The format for your next appointment:   In Person  Provider:   Gwyndolyn Kaufman, MD   Other Instructions      Signed, Freada Bergeron, MD  04/27/2020 10:11 AM    [place]

## 2020-04-27 ENCOUNTER — Other Ambulatory Visit: Payer: Self-pay

## 2020-04-27 ENCOUNTER — Encounter: Payer: Self-pay | Admitting: Cardiology

## 2020-04-27 ENCOUNTER — Ambulatory Visit (INDEPENDENT_AMBULATORY_CARE_PROVIDER_SITE_OTHER): Payer: Medicare PPO | Admitting: Cardiology

## 2020-04-27 VITALS — BP 114/70 | HR 93 | Ht 66.0 in | Wt 155.4 lb

## 2020-04-27 DIAGNOSIS — R079 Chest pain, unspecified: Secondary | ICD-10-CM

## 2020-04-27 DIAGNOSIS — I10 Essential (primary) hypertension: Secondary | ICD-10-CM

## 2020-04-27 DIAGNOSIS — E785 Hyperlipidemia, unspecified: Secondary | ICD-10-CM | POA: Diagnosis not present

## 2020-04-27 DIAGNOSIS — R001 Bradycardia, unspecified: Secondary | ICD-10-CM

## 2020-04-27 NOTE — Patient Instructions (Signed)
Medication Instructions:  Your physician recommends that you continue on your current medications as directed. Please refer to the Current Medication list given to you today.  *If you need a refill on your cardiac medications before your next appointment, please call your pharmacy*   Lab Work: Your physician recommends that you return for lab work on January 18,2022.  Lipid profile.  This will be fasting.  You can come in anytime after 7:30 AM  If you have labs (blood work) drawn today and your tests are completely normal, you will receive your results only by: [name] MyChart Message (if you have MyChart) OR . A paper copy in the mail If you have any lab test that is abnormal or we need to change your treatment, we will call you to review the results.   Testing/Procedures: none   Follow-Up: At [facility], you and your health needs are our priority.  As part of our continuing mission to provide you with exceptional heart care, we have created designated Provider Care Teams.  These Care Teams include your primary Cardiologist (physician) and Advanced Practice Providers (APPs -  Physician Assistants and Nurse Practitioners) who all work together to provide you with the care you need, when you need it.  We recommend signing up for the patient portal called "MyChart".  Sign up information is provided on this After Visit Summary.  MyChart is used to connect with patients for Virtual Visits (Telemedicine).  Patients are able to view lab/test results, encounter notes, upcoming appointments, etc.  Non-urgent messages can be sent to your provider as well.   To learn more about what you can do with MyChart, go to [email or web address].    Your next appointment:   June 7,2022 at 9:00  The format for your next appointment:   In Person  Provider:   Gwyndolyn Kaufman, MD   Other Instructions

## 2020-04-30 DIAGNOSIS — E782 Mixed hyperlipidemia: Secondary | ICD-10-CM | POA: Diagnosis not present

## 2020-04-30 DIAGNOSIS — M25512 Pain in left shoulder: Secondary | ICD-10-CM | POA: Diagnosis not present

## 2020-05-04 DIAGNOSIS — L501 Idiopathic urticaria: Secondary | ICD-10-CM | POA: Diagnosis not present

## 2020-05-18 DIAGNOSIS — M25512 Pain in left shoulder: Secondary | ICD-10-CM | POA: Diagnosis not present

## 2020-05-18 DIAGNOSIS — M542 Cervicalgia: Secondary | ICD-10-CM | POA: Diagnosis not present

## 2020-05-24 DIAGNOSIS — M25512 Pain in left shoulder: Secondary | ICD-10-CM | POA: Diagnosis not present

## 2020-05-24 DIAGNOSIS — M542 Cervicalgia: Secondary | ICD-10-CM | POA: Diagnosis not present

## 2020-05-26 DIAGNOSIS — M25512 Pain in left shoulder: Secondary | ICD-10-CM | POA: Diagnosis not present

## 2020-05-26 DIAGNOSIS — M542 Cervicalgia: Secondary | ICD-10-CM | POA: Diagnosis not present

## 2020-05-28 DIAGNOSIS — M542 Cervicalgia: Secondary | ICD-10-CM | POA: Diagnosis not present

## 2020-05-28 DIAGNOSIS — M25512 Pain in left shoulder: Secondary | ICD-10-CM | POA: Diagnosis not present

## 2020-05-31 DIAGNOSIS — M25512 Pain in left shoulder: Secondary | ICD-10-CM | POA: Diagnosis not present

## 2020-05-31 DIAGNOSIS — M542 Cervicalgia: Secondary | ICD-10-CM | POA: Diagnosis not present

## 2020-06-01 DIAGNOSIS — L501 Idiopathic urticaria: Secondary | ICD-10-CM | POA: Diagnosis not present

## 2020-06-02 DIAGNOSIS — M542 Cervicalgia: Secondary | ICD-10-CM | POA: Diagnosis not present

## 2020-06-02 DIAGNOSIS — M25512 Pain in left shoulder: Secondary | ICD-10-CM | POA: Diagnosis not present

## 2020-06-04 DIAGNOSIS — M25512 Pain in left shoulder: Secondary | ICD-10-CM | POA: Diagnosis not present

## 2020-06-04 DIAGNOSIS — M542 Cervicalgia: Secondary | ICD-10-CM | POA: Diagnosis not present

## 2020-06-08 ENCOUNTER — Other Ambulatory Visit: Payer: Medicare PPO

## 2020-06-09 DIAGNOSIS — M25512 Pain in left shoulder: Secondary | ICD-10-CM | POA: Diagnosis not present

## 2020-06-09 DIAGNOSIS — M542 Cervicalgia: Secondary | ICD-10-CM | POA: Diagnosis not present

## 2020-06-11 DIAGNOSIS — M542 Cervicalgia: Secondary | ICD-10-CM | POA: Diagnosis not present

## 2020-06-11 DIAGNOSIS — M25512 Pain in left shoulder: Secondary | ICD-10-CM | POA: Diagnosis not present

## 2020-06-14 DIAGNOSIS — M542 Cervicalgia: Secondary | ICD-10-CM | POA: Diagnosis not present

## 2020-06-14 DIAGNOSIS — M25512 Pain in left shoulder: Secondary | ICD-10-CM | POA: Diagnosis not present

## 2020-06-16 DIAGNOSIS — M25512 Pain in left shoulder: Secondary | ICD-10-CM | POA: Diagnosis not present

## 2020-06-16 DIAGNOSIS — M542 Cervicalgia: Secondary | ICD-10-CM | POA: Diagnosis not present

## 2020-06-18 DIAGNOSIS — M542 Cervicalgia: Secondary | ICD-10-CM | POA: Diagnosis not present

## 2020-06-18 DIAGNOSIS — M25512 Pain in left shoulder: Secondary | ICD-10-CM | POA: Diagnosis not present

## 2020-06-21 DIAGNOSIS — M542 Cervicalgia: Secondary | ICD-10-CM | POA: Diagnosis not present

## 2020-06-21 DIAGNOSIS — M25512 Pain in left shoulder: Secondary | ICD-10-CM | POA: Diagnosis not present

## 2020-06-23 DIAGNOSIS — M25512 Pain in left shoulder: Secondary | ICD-10-CM | POA: Diagnosis not present

## 2020-06-23 DIAGNOSIS — M542 Cervicalgia: Secondary | ICD-10-CM | POA: Diagnosis not present

## 2020-06-28 DIAGNOSIS — M25512 Pain in left shoulder: Secondary | ICD-10-CM | POA: Diagnosis not present

## 2020-06-28 DIAGNOSIS — M542 Cervicalgia: Secondary | ICD-10-CM | POA: Diagnosis not present

## 2020-06-29 DIAGNOSIS — L501 Idiopathic urticaria: Secondary | ICD-10-CM | POA: Diagnosis not present

## 2020-07-01 DIAGNOSIS — D3132 Benign neoplasm of left choroid: Secondary | ICD-10-CM | POA: Diagnosis not present

## 2020-07-01 DIAGNOSIS — H35363 Drusen (degenerative) of macula, bilateral: Secondary | ICD-10-CM | POA: Diagnosis not present

## 2020-07-01 DIAGNOSIS — M25512 Pain in left shoulder: Secondary | ICD-10-CM | POA: Diagnosis not present

## 2020-07-01 DIAGNOSIS — M542 Cervicalgia: Secondary | ICD-10-CM | POA: Diagnosis not present

## 2020-07-01 DIAGNOSIS — H43813 Vitreous degeneration, bilateral: Secondary | ICD-10-CM | POA: Diagnosis not present

## 2020-07-05 DIAGNOSIS — M542 Cervicalgia: Secondary | ICD-10-CM | POA: Diagnosis not present

## 2020-07-05 DIAGNOSIS — M25512 Pain in left shoulder: Secondary | ICD-10-CM | POA: Diagnosis not present

## 2020-07-07 DIAGNOSIS — R739 Hyperglycemia, unspecified: Secondary | ICD-10-CM | POA: Diagnosis not present

## 2020-07-07 DIAGNOSIS — E782 Mixed hyperlipidemia: Secondary | ICD-10-CM | POA: Diagnosis not present

## 2020-07-07 DIAGNOSIS — Z79899 Other long term (current) drug therapy: Secondary | ICD-10-CM | POA: Diagnosis not present

## 2020-07-07 DIAGNOSIS — R030 Elevated blood-pressure reading, without diagnosis of hypertension: Secondary | ICD-10-CM | POA: Diagnosis not present

## 2020-07-08 DIAGNOSIS — M542 Cervicalgia: Secondary | ICD-10-CM | POA: Diagnosis not present

## 2020-07-08 DIAGNOSIS — M25512 Pain in left shoulder: Secondary | ICD-10-CM | POA: Diagnosis not present

## 2020-07-13 DIAGNOSIS — M542 Cervicalgia: Secondary | ICD-10-CM | POA: Diagnosis not present

## 2020-07-13 DIAGNOSIS — M25512 Pain in left shoulder: Secondary | ICD-10-CM | POA: Diagnosis not present

## 2020-07-15 DIAGNOSIS — H04123 Dry eye syndrome of bilateral lacrimal glands: Secondary | ICD-10-CM | POA: Diagnosis not present

## 2020-07-15 DIAGNOSIS — E039 Hypothyroidism, unspecified: Secondary | ICD-10-CM | POA: Diagnosis not present

## 2020-07-15 DIAGNOSIS — H02834 Dermatochalasis of left upper eyelid: Secondary | ICD-10-CM | POA: Diagnosis not present

## 2020-07-15 DIAGNOSIS — Z961 Presence of intraocular lens: Secondary | ICD-10-CM | POA: Diagnosis not present

## 2020-07-15 DIAGNOSIS — D3131 Benign neoplasm of right choroid: Secondary | ICD-10-CM | POA: Diagnosis not present

## 2020-07-15 DIAGNOSIS — H35342 Macular cyst, hole, or pseudohole, left eye: Secondary | ICD-10-CM | POA: Diagnosis not present

## 2020-07-15 DIAGNOSIS — H527 Unspecified disorder of refraction: Secondary | ICD-10-CM | POA: Diagnosis not present

## 2020-07-15 DIAGNOSIS — R9389 Abnormal findings on diagnostic imaging of other specified body structures: Secondary | ICD-10-CM | POA: Diagnosis not present

## 2020-07-15 DIAGNOSIS — H02831 Dermatochalasis of right upper eyelid: Secondary | ICD-10-CM | POA: Diagnosis not present

## 2020-07-16 DIAGNOSIS — M25512 Pain in left shoulder: Secondary | ICD-10-CM | POA: Diagnosis not present

## 2020-07-16 DIAGNOSIS — M542 Cervicalgia: Secondary | ICD-10-CM | POA: Diagnosis not present

## 2020-07-20 DIAGNOSIS — M542 Cervicalgia: Secondary | ICD-10-CM | POA: Diagnosis not present

## 2020-07-20 DIAGNOSIS — M25512 Pain in left shoulder: Secondary | ICD-10-CM | POA: Diagnosis not present

## 2020-07-27 DIAGNOSIS — L501 Idiopathic urticaria: Secondary | ICD-10-CM | POA: Diagnosis not present

## 2020-08-02 ENCOUNTER — Telehealth: Payer: Self-pay | Admitting: Internal Medicine

## 2020-08-02 DIAGNOSIS — M542 Cervicalgia: Secondary | ICD-10-CM | POA: Diagnosis not present

## 2020-08-02 DIAGNOSIS — M25512 Pain in left shoulder: Secondary | ICD-10-CM | POA: Diagnosis not present

## 2020-08-02 NOTE — Telephone Encounter (Signed)
Scheduled per 03/14 scheduled message, patient has been called and voicemail was left.

## 2020-08-05 ENCOUNTER — Ambulatory Visit (HOSPITAL_COMMUNITY)
Admission: RE | Admit: 2020-08-05 | Discharge: 2020-08-05 | Disposition: A | Discharge status: Home / Self Care | Payer: Medicare PPO | Source: Ambulatory Visit | Attending: Internal Medicine | Admitting: Internal Medicine

## 2020-08-05 ENCOUNTER — Encounter (HOSPITAL_COMMUNITY): Payer: Self-pay

## 2020-08-05 ENCOUNTER — Other Ambulatory Visit: Payer: Self-pay

## 2020-08-05 ENCOUNTER — Inpatient Hospital Stay: Payer: Medicare PPO | Attending: Internal Medicine

## 2020-08-05 DIAGNOSIS — I2510 Atherosclerotic heart disease of native coronary artery without angina pectoris: Secondary | ICD-10-CM | POA: Diagnosis not present

## 2020-08-05 DIAGNOSIS — Z7982 Long term (current) use of aspirin: Secondary | ICD-10-CM | POA: Diagnosis not present

## 2020-08-05 DIAGNOSIS — C3490 Malignant neoplasm of unspecified part of unspecified bronchus or lung: Secondary | ICD-10-CM | POA: Insufficient documentation

## 2020-08-05 DIAGNOSIS — J439 Emphysema, unspecified: Secondary | ICD-10-CM | POA: Insufficient documentation

## 2020-08-05 DIAGNOSIS — Z79899 Other long term (current) drug therapy: Secondary | ICD-10-CM | POA: Diagnosis not present

## 2020-08-05 DIAGNOSIS — C3412 Malignant neoplasm of upper lobe, left bronchus or lung: Secondary | ICD-10-CM | POA: Insufficient documentation

## 2020-08-05 DIAGNOSIS — I700 Atherosclerosis of aorta: Secondary | ICD-10-CM | POA: Insufficient documentation

## 2020-08-05 DIAGNOSIS — I739 Peripheral vascular disease, unspecified: Secondary | ICD-10-CM | POA: Diagnosis not present

## 2020-08-05 DIAGNOSIS — E340 Carcinoid syndrome: Secondary | ICD-10-CM | POA: Diagnosis not present

## 2020-08-05 DIAGNOSIS — E039 Hypothyroidism, unspecified: Secondary | ICD-10-CM | POA: Diagnosis not present

## 2020-08-05 DIAGNOSIS — C7A00 Malignant carcinoid tumor of unspecified site: Secondary | ICD-10-CM | POA: Diagnosis not present

## 2020-08-05 DIAGNOSIS — M2550 Pain in unspecified joint: Secondary | ICD-10-CM | POA: Insufficient documentation

## 2020-08-05 DIAGNOSIS — I10 Essential (primary) hypertension: Secondary | ICD-10-CM | POA: Diagnosis not present

## 2020-08-05 LAB — CBC WITH DIFFERENTIAL (CANCER CENTER ONLY)
Abs Immature Granulocytes: 0.01 10*3/uL (ref 0.00–0.07)
Basophils Absolute: 0.1 10*3/uL (ref 0.0–0.1)
Basophils Relative: 1 %
Eosinophils Absolute: 0.3 10*3/uL (ref 0.0–0.5)
Eosinophils Relative: 6 %
HCT: 40.1 % (ref 36.0–46.0)
Hemoglobin: 13.2 g/dL (ref 12.0–15.0)
Immature Granulocytes: 0 %
Lymphocytes Relative: 34 %
Lymphs Abs: 2 10*3/uL (ref 0.7–4.0)
MCH: 29.7 pg (ref 26.0–34.0)
MCHC: 32.9 g/dL (ref 30.0–36.0)
MCV: 90.3 fL (ref 80.0–100.0)
Monocytes Absolute: 0.8 10*3/uL (ref 0.1–1.0)
Monocytes Relative: 13 %
Neutro Abs: 2.8 10*3/uL (ref 1.7–7.7)
Neutrophils Relative %: 46 %
Platelet Count: 281 10*3/uL (ref 150–400)
RBC: 4.44 MIL/uL (ref 3.87–5.11)
RDW: 14.1 % (ref 11.5–15.5)
WBC Count: 5.9 10*3/uL (ref 4.0–10.5)
nRBC: 0 % (ref 0.0–0.2)

## 2020-08-05 LAB — CMP (CANCER CENTER ONLY)
ALT: 46 U/L — ABNORMAL HIGH (ref 0–44)
AST: 43 U/L — ABNORMAL HIGH (ref 15–41)
Albumin: 4 g/dL (ref 3.5–5.0)
Alkaline Phosphatase: 122 U/L (ref 38–126)
Anion gap: 8 (ref 5–15)
BUN: 19 mg/dL (ref 8–23)
CO2: 28 mmol/L (ref 22–32)
Calcium: 9.4 mg/dL (ref 8.9–10.3)
Chloride: 105 mmol/L (ref 98–111)
Creatinine: 0.86 mg/dL (ref 0.44–1.00)
GFR, Estimated: >60 mL/min (ref >60)
Glucose, Bld: 95 mg/dL (ref 70–99)
Potassium: 3.9 mmol/L (ref 3.5–5.1)
Sodium: 141 mmol/L (ref 135–145)
Total Bilirubin: 0.5 mg/dL (ref 0.3–1.2)
Total Protein: 6.8 g/dL (ref 6.5–8.1)

## 2020-08-05 MED ORDER — IOHEXOL 300 MG/ML  SOLN
75.0000 mL | Freq: Once | INTRAMUSCULAR | Status: AC | PRN
  Administered 2020-08-05: 75 mL via INTRAVENOUS

## 2020-08-06 ENCOUNTER — Other Ambulatory Visit: Payer: Medicare PPO

## 2020-08-10 ENCOUNTER — Inpatient Hospital Stay (HOSPITAL_BASED_OUTPATIENT_CLINIC_OR_DEPARTMENT_OTHER): Payer: Medicare PPO | Admitting: Internal Medicine

## 2020-08-10 ENCOUNTER — Encounter: Payer: Self-pay | Admitting: Internal Medicine

## 2020-08-10 ENCOUNTER — Other Ambulatory Visit: Payer: Self-pay

## 2020-08-10 VITALS — BP 155/84 | HR 69 | Temp 97.0°F | Resp 15 | Ht 66.0 in | Wt 157.8 lb

## 2020-08-10 DIAGNOSIS — C3412 Malignant neoplasm of upper lobe, left bronchus or lung: Secondary | ICD-10-CM | POA: Diagnosis not present

## 2020-08-10 DIAGNOSIS — I739 Peripheral vascular disease, unspecified: Secondary | ICD-10-CM | POA: Diagnosis not present

## 2020-08-10 DIAGNOSIS — E039 Hypothyroidism, unspecified: Secondary | ICD-10-CM | POA: Diagnosis not present

## 2020-08-10 DIAGNOSIS — C3490 Malignant neoplasm of unspecified part of unspecified bronchus or lung: Secondary | ICD-10-CM | POA: Diagnosis not present

## 2020-08-10 DIAGNOSIS — D3A090 Benign carcinoid tumor of the bronchus and lung: Secondary | ICD-10-CM | POA: Diagnosis not present

## 2020-08-10 DIAGNOSIS — I700 Atherosclerosis of aorta: Secondary | ICD-10-CM | POA: Diagnosis not present

## 2020-08-10 DIAGNOSIS — Z79899 Other long term (current) drug therapy: Secondary | ICD-10-CM | POA: Diagnosis not present

## 2020-08-10 DIAGNOSIS — I10 Essential (primary) hypertension: Secondary | ICD-10-CM | POA: Diagnosis not present

## 2020-08-10 DIAGNOSIS — J439 Emphysema, unspecified: Secondary | ICD-10-CM | POA: Diagnosis not present

## 2020-08-10 DIAGNOSIS — M2550 Pain in unspecified joint: Secondary | ICD-10-CM | POA: Diagnosis not present

## 2020-08-10 DIAGNOSIS — Z7982 Long term (current) use of aspirin: Secondary | ICD-10-CM | POA: Diagnosis not present

## 2020-08-10 NOTE — Progress Notes (Signed)
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  PRIOR THERAPY: Status post right middle lobectomy with right upper lobe wedge resection and lymph node dissection under the care of Dr. Roxan Hockey on 01/15/2020.  CURRENT THERAPY: Observation.   INTERVAL HISTORY: Cynthia Rhodes 75 y.o. female returns to the clinic today for follow-up visit.  The patient is feeling fine today with no concerning complaints except for arthralgia and she takes Tylenol at nighttime.  She denied having any current chest pain, shortness of breath, cough or hemoptysis.  She denied having any fever or chills.  She has no nausea, vomiting, diarrhea or constipation.  She has no headache or visual changes.  She is here today for evaluation with repeat blood work and CT scan of the chest for restaging of her disease.  MEDICAL HISTORY: Past Medical History:  Diagnosis Date  . Cancer ([place]) 2021  . History of hiatal hernia   . Hypothyroidism   . Peripheral vascular disease (H[facility])    varicose veins    ALLERGIES:  is allergic to codeine, drug [tape], sulfa antibiotics, and voltaren [diclofenac sodium].  MEDICATIONS:  Current Outpatient Medications  Medication Sig Dispense Refill  . acetaminophen (TYLENOL) 650 MG CR tablet Take 1,300 mg by mouth every 8 (eight) hours as needed for pain.    [name] albuterol (VENTOLIN HFA) 108 (90 Base) MCG/ACT inhaler Inhale 2 puffs into the lungs every 6 (six) hours as needed for wheezing or shortness of breath. 8 g 6  . amLODipine (NORVASC) 5 MG tablet Take 1 tablet (5 mg total) by mouth daily. Hold for systolic blood pressure less than 120. 30 tablet 1  . aspirin EC 81 MG EC tablet Take 1 tablet  (81 mg total) by mouth daily. Swallow whole. 30 tablet 11  . atorvastatin (LIPITOR) 40 MG tablet Take 1 tablet (40 mg total) by mouth daily. 30 tablet 1  . Calcium Carb-Cholecalciferol (CALCIUM + D3 PO) Take 1 tablet by mouth daily.     [name] levothyroxine (SYNTHROID) 50 MCG tablet Take 50 mcg by mouth every other day. Alternating with 75 mcg    . levothyroxine (SYNTHROID) 75 MCG tablet Take 75 mcg by mouth every other day. Alternating with 50 mcg    . lidocaine (LIDODERM) 5 % Place 1 patch onto the skin daily as needed (pain). Remove & Discard patch within 12 hours or as directed by MD    . magnesium oxide (MAG-OX) 400 MG tablet Take 400 mg by mouth daily.    . Multiple Vitamins-Minerals (OCUVITE EYE HEALTH FORMULA PO) Take 1 tablet by mouth daily.    [name] omalizumab Arvid Right) 150 MG/ML prefilled syringe Inject 150 mg into the skin every 28 (twenty-eight) days.    [name] OVER THE COUNTER MEDICATION Take 6 capsules by mouth daily. Balance of Nature Fruits & Veggies supplement (3 Veggies + 3 Fruit)    . polyvinyl alcohol (LIQUIFILM TEARS) 1.4 % ophthalmic solution Place 1 drop into both eyes as needed for dry eyes.    . traMADol (ULTRAM) 50 MG tablet Take 1 tablet (50 mg total) by mouth every 8 (eight) hours as needed for up to 10 doses for severe pain. 10 tablet 0  .  TURMERIC PO Take 1,000 mg by mouth daily.      No current facility-administered medications for this visit.    SURGICAL HISTORY:  Past Surgical History:  Procedure Laterality Date  . ABDOMINAL HYSTERECTOMY    . APPENDECTOMY  1954  . BRONCHIAL BIOPSY  11/04/2019   Procedure: BRONCHIAL BIOPSIES;  Surgeon: Collene Gobble, MD;  Location[facility] ENDOSCOPY;  Service: Pulmonary;;  . BRONCHIAL BRUSHINGS  11/04/2019   Procedure: BRONCHIAL BRUSHINGS;  Surgeon: Collene Gobble, MD;  Location[facility] ENDOSCOPY;  Service: Pulmonary;;  . BRONCHIAL NEEDLE ASPIRATION BIOPSY  11/04/2019   Procedure: BRONCHIAL NEEDLE ASPIRATION BIOPSIES;  Surgeon: Collene Gobble,  MD;  Location: [facility] ENDOSCOPY;  Service: Pulmonary;;  . BRONCHIAL WASHINGS  11/04/2019   Procedure: BRONCHIAL WASHINGS;  Surgeon: Collene Gobble, MD;  Location: [facility] ENDOSCOPY;  Service: Pulmonary;;  . BUNIONECTOMY Bilateral   . CHOLECYSTECTOMY    . [place]  . EYE SURGERY Bilateral 2021  . INTERCOSTAL NERVE BLOCK Right 01/15/2020   Procedure: INTERCOSTAL NERVE BLOCK;  Surgeon: Melrose Nakayama, MD;  Location: [place];  Service: Thoracic;  Laterality: Right;  . JOINT REPLACEMENT Bilateral    Knee  . KNEE SURGERY Bilateral   . LYMPH NODE DISSECTION N/A 01/15/2020   Procedure: LYMPH NODE DISSECTION;  Surgeon: Melrose Nakayama, MD;  Location: [place];  Service: Thoracic;  Laterality: N/A;  . REPLACEMENT TOTAL KNEE BILATERAL    . SIGMOIDECTOMY    . TONSILLECTOMY    . VIDEO BRONCHOSCOPY WITH ENDOBRONCHIAL NAVIGATION N/A 11/04/2019   Procedure: VIDEO BRONCHOSCOPY WITH ENDOBRONCHIAL NAVIGATION;  Surgeon: Collene Gobble, MD;  Location: [place] ENDOSCOPY;  Service: Pulmonary;  Laterality: N/A;  . VIDEO BRONCHOSCOPY WITH ENDOBRONCHIAL NAVIGATION N/A 01/15/2020   Procedure: VIDEO BRONCHOSCOPY WITH ENDOBRONCHIAL NAVIGATION to mark right upper lobe nodule;  Surgeon: Melrose Nakayama, MD;  Location: [facility] OR;  Service: Thoracic;  Laterality: N/A;    REVIEW OF SYSTEMS:  A comprehensive review of systems was negative.   PHYSICAL EXAMINATION: General appearance: alert, cooperative and no distress Head: Normocephalic, without obvious abnormality, atraumatic Neck: no adenopathy, no JVD, supple, symmetrical, trachea midline and thyroid not enlarged, symmetric, no tenderness/mass/nodules Lymph nodes: Cervical, supraclavicular, and axillary nodes normal. Resp: clear to auscultation bilaterally Back: symmetric, no curvature. ROM normal. No CVA tenderness. Cardio: regular rate and rhythm, S1, S2 normal, no murmur, click, rub or gallop GI: soft, non-tender; bowel sounds normal; no masses,  no  organomegaly Extremities: extremities normal, atraumatic, no cyanosis or edema  ECOG PERFORMANCE STATUS: 1 - Symptomatic but completely ambulatory  Blood pressure (!) 155/84, pulse 69, temperature (!) 97 F (36.1 C), temperature source Tympanic, resp. rate 15, height 5\' 6"  (1.676 m), weight 157 lb 12.8 oz (71.6 kg), SpO2 100 %.  LABORATORY DATA: Lab Results  Component Value Date   WBC 5.9 08/05/2020   HGB 13.2 08/05/2020   HCT 40.1 08/05/2020   MCV 90.3 08/05/2020   PLT 281 08/05/2020      Chemistry      Component Value Date/Time   NA 141 08/05/2020 1408   K 3.9 08/05/2020 1408   CL 105 08/05/2020 1408   CO2 28 08/05/2020 1408   BUN 19 08/05/2020 1408   CREATININE 0.86 08/05/2020 1408      Component Value Date/Time   CALCIUM 9.4 08/05/2020 1408   ALKPHOS 122 08/05/2020 1408   AST 43 (H) 08/05/2020 1408   ALT 46 (H) 08/05/2020 1408   BILITOT 0.5 08/05/2020  1408       RADIOGRAPHIC STUDIES: CT Chest W Contrast  Result Date: 08/06/2020 CLINICAL DATA:  Carcinoid tumor. Status post right middle lobectomy and right upper lobe wedge resection. Restaging. EXAM: CT CHEST WITH CONTRAST TECHNIQUE: Multidetector CT imaging of the chest was performed during intravenous contrast administration. CONTRAST:  58mL OMNIPAQUE IOHEXOL 300 MG/ML  SOLN COMPARISON:  CTA chest 04/24/2020 FINDINGS: Cardiovascular: The heart size is normal. No substantial pericardial effusion. Atherosclerotic calcification is noted in the wall of the thoracic aorta. Mediastinum/Nodes: No mediastinal lymphadenopathy. No evidence for gross hilar lymphadenopathy although assessment is limited by the lack of intravenous contrast on today's study. The esophagus has normal imaging features. There is no axillary lymphadenopathy. Lungs/Pleura: Centrilobular emphsyema noted. Stable appearance of scarring in the medial right upper lobe compatible with wedge resection. Tiny 4 mm perifissural nodule in the right lung (39/7) is  unchanged in the interval. 3 mm peripheral right upper lobe nodule on 43/7 is unchanged. Mild volume loss right hemithorax compatible with right middle lobectomy. 4 mm right lower lobe nodule on 92/7 is stable. Scattered peripheral tiny nodules in the lingula and left lower lobe are not substantially changed in the interval. No new suspicious pulmonary nodule or mass. No focal airspace consolidation. There is no evidence of pleural effusion. Upper Abdomen: Unremarkable. Musculoskeletal: No worrisome lytic or sclerotic osseous abnormality. IMPRESSION: 1. Stable exam. No new or progressive findings to suggest recurrent or metastatic disease. 2. Status post right middle lobectomy and right upper lobe wedge resection. 3. Scattered tiny bilateral pulmonary nodules are unchanged in the interval. Continued attention on follow-up recommended. 4. Aortic Atherosclerosis (ICD10-I70.0) and Emphysema (ICD10-J43.9). Electronically Signed   By: Misty Stanley M.D.   On: 08/06/2020 08:30    ASSESSMENT AND PLAN: This is a very pleasant 75 years old white female recently diagnosed with a stage IIIa (T4, N0, M0) well-differentiated neuroendocrine tumor, carcinoid tumor with multifocal disease in the right upper lobe as well as the right middle lobe status post right middle lobectomy and wedge resection of the right upper lobe with lymph node dissection under the care of Dr. Roxan Hockey on 01/15/2020. The patient is feeling fine today with no concerning complaints except for arthralgia. She had repeat CT scan of the chest performed recently.  I personally and independently reviewed the scans and discussed the results with the patient today. Her scan showed no concerning findings for disease progression. I recommended for the patient to continue on observation with repeat CT scan of the chest in 6 months. For the mild elevation of the liver enzymes, she was advised to cut on the Tylenol dose and to discuss with her primary care  physician for any additional recommendation. For hypertension she will continue with her current blood pressure medication and monitor it closely at home. The patient was advised to call immediately if she has any concerning symptoms in the interval. The patient voices understanding of current disease status and treatment options and is in agreement with the current care plan.  All questions were answered. The patient knows to call the clinic with any problems, questions or concerns. We can certainly see the patient much sooner if necessary.  Disclaimer: This note was dictated with voice recognition software. Similar sounding words can inadvertently be transcribed and may not be corrected upon review.

## 2020-08-12 ENCOUNTER — Other Ambulatory Visit: Payer: Medicare PPO

## 2020-08-12 ENCOUNTER — Telehealth: Payer: Self-pay | Admitting: Medical Oncology

## 2020-08-12 NOTE — Telephone Encounter (Signed)
Can she get massages ?   She is under observation Stage IIIA typical carcinoid and RUL resection on 08/21.  I told her it was ok to get massages.

## 2020-08-13 NOTE — Telephone Encounter (Signed)
Yes

## 2020-08-16 NOTE — Telephone Encounter (Signed)
Pt notified via V/M

## 2020-08-18 DIAGNOSIS — M25512 Pain in left shoulder: Secondary | ICD-10-CM | POA: Diagnosis not present

## 2020-08-18 DIAGNOSIS — M542 Cervicalgia: Secondary | ICD-10-CM | POA: Diagnosis not present

## 2020-08-20 ENCOUNTER — Other Ambulatory Visit: Payer: Self-pay

## 2020-08-20 ENCOUNTER — Ambulatory Visit (INDEPENDENT_AMBULATORY_CARE_PROVIDER_SITE_OTHER): Payer: Medicare PPO | Admitting: Podiatrist

## 2020-08-20 ENCOUNTER — Other Ambulatory Visit: Payer: Self-pay | Admitting: Podiatrist

## 2020-08-20 ENCOUNTER — Ambulatory Visit (INDEPENDENT_AMBULATORY_CARE_PROVIDER_SITE_OTHER): Payer: Medicare PPO

## 2020-08-20 ENCOUNTER — Encounter: Payer: Self-pay | Admitting: Podiatrist

## 2020-08-20 DIAGNOSIS — M76821 Posterior tibial tendinitis, right leg: Secondary | ICD-10-CM

## 2020-08-20 DIAGNOSIS — M79671 Pain in right foot: Secondary | ICD-10-CM

## 2020-08-20 DIAGNOSIS — S99921A Unspecified injury of right foot, initial encounter: Secondary | ICD-10-CM

## 2020-08-20 NOTE — Patient Instructions (Signed)
I will order you a cream for the pain as well as order some physical therapy to strengthen your foot.    Posterior Tibial Tendinitis Posterior tibial tendinitis is irritation of a tendon called the posterior tibial tendon. Your posterior tibial tendon is a cord-like tissue that connects bones of your lower leg and foot to a muscle that:  Supports your arch.  Helps you raise up on your toes.  Helps you turn your foot down and in. This condition causes foot and ankle pain. It can also lead to a flat foot. What are the causes? This condition is most often caused by repeated stress to the tendon (overuse injury). It can also be caused by a sudden injury that stresses the tendon, such as landing on your foot after jumping or falling. What increases the risk? This condition is more likely to develop in:  People who play a sport that involves putting a lot of pressure on the feet, such as: ? Basketball. ? Tennis. ? Soccer. ? Hockey.  Runners.  Females who are older than 76 years of age and are overweight.  People with diabetes.  People with decreased foot stability.  People with flat feet. What are the signs or symptoms? Symptoms include:  Pain in the inner ankle.  Pain at the arch of your foot.  Pain that gets worse with running, walking, or standing.  Swelling on the inside of your ankle and foot.  Weakness in your ankle or foot.  Inability to stand up on tiptoe.  Flattening of the arch of your foot. How is this diagnosed? This condition may be diagnosed based on:  Your symptoms.  Your medical history.  A physical exam.  Tests, such as: ? X-ray. ? MRI. ? Ultrasound. How is this treated? This condition may be treated by:  Putting ice to the injured area.  Taking NSAIDs, such as ibuprofen, to reduce pain and swelling.  Wearing a special shoe or shoe insert to support your arch (orthotic).  Having physical therapy.  Replacing high-impact exercise with  low-impact exercise, such as swimming or cycling. If your symptoms do not improve with these treatments, you may need to wear a splint, removable walking boot, or short leg cast for 6-8 weeks to keep your foot and ankle still (immobilized). Follow these instructions at home: If you have a cast, splint, or boot:  Keep it clean and dry.  Check the skin around it every day. Tell your health care provider about any concerns. If you have a cast:  Do not stick anything inside it to scratch your skin. Doing that increases your risk of infection.  You may put lotion on dry skin around the edges of the cast. Do not put lotion on the skin underneath the cast. If you have a splint or boot:  Wear it as told by your health care provider. Remove it only as told by your health care provider.  Loosen it if your toes tingle, become numb, or turn cold and blue. Bathing  Do not take baths, swim, or use a hot tub until your health care provider approves. Ask your health care provider if you may take showers.  If your cast, splint, or boot is not waterproof: ? Do not let it get wet. ? Cover it with a waterproof covering while you take a bath or a shower. Managing pain and swelling  If directed, put ice on the injured area. ? If you have a removable splint or boot, remove it  as told by your health care provider. ? Put ice in a plastic bag. ? Place a towel between your skin and the bag or between your cast and the bag. ? Leave the ice on for 20 minutes, 2-3 times a day.  Move your toes often to reduce stiffness and swelling.  Raise (elevate) the injured area above the level of your heart while you are sitting or lying down.   Activity  Do not use the injured foot to support your body weight until your health care provider says that you can. Use crutches as told by your health care provider.  Do not do activities that make pain or swelling worse.  Ask your health care provider when it is safe to  drive if you have a cast, splint, or boot on your foot.  Return to your normal activities as told by your health care provider. Ask your health care provider what activities are safe for you.  Do exercises as told by your health care provider. General instructions  Take over-the-counter and prescription medicines only as told by your health care provider.  If you have an orthotic, use it as told by your health care provider.  Keep all follow-up visits as told by your health care provider. This is important. How is this prevented?  Wear footwear that is appropriate to your athletic activity.  Avoid athletic activities that cause pain or swelling in your ankle or foot.  Before being active, do range-of-motion and stretching exercises.  If you develop pain or swelling while training, stop training.  If you have pain or swelling that does not improve after a few days of rest, see your health care provider.  If you start a new athletic activity, start gradually so you can build up your strength and flexibility. Contact a health care provider if:  Your symptoms get worse.  Your symptoms do not improve in 6-8 weeks.  You develop new, unexplained symptoms.  Your splint, boot, or cast gets damaged. Summary  Posterior tibial tendinitis is irritation of a tendon called the posterior tibial tendon.  This condition is most often caused by repeated stress to the tendon (overuse injury).  This condition causes foot pain and ankle pain. It can also lead to a flat foot.  This condition may be treated by not doing high-impact activities, applying ice, having physical therapy, wearing orthotics, and wearing a cast, splint, or boot if needed. This information is not intended to replace advice given to you by your health care provider. Make sure you discuss any questions you have with your health care provider. Document Revised: 09/03/2018 Document Reviewed: 07/11/2018 Elsevier Patient  Education  [place].

## 2020-08-20 NOTE — Progress Notes (Signed)
Chief Complaint  Patient presents with  . Pain    Pain at Rt medial ankle radiates to dorsal x 3 wks; 9/10 constant pain -per pt pain is getting worse - no injury - no swelling -30 yrs with ankle fx -worse with steps or at night time Tx: lidocaine patch      HPI: Patient is 75 y.o. female who presents today for the concerns as listed above. She relates 3 weeks of pain on the ankle. She relates she got some inserts from the [employer] store which helped her in many ways but she then developed this foot pain that has not improved since stopping of wearing the inserts.    Patient Active Problem List   Diagnosis Date Noted  . Dyspnea 02/12/2020  . S/P Robotic Assisted Wedge Resection Right Upper Lobe Lung, Right Middle Lobectomy  01/15/2020  . 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 Pulmonary nodules 10/10/2019    Current Outpatient Medications on File Prior to Visit  Medication Sig Dispense Refill  . acetaminophen (TYLENOL) 650 MG CR tablet Take 1,300 mg by mouth every 8 (eight) hours as needed for pain.    [name] albuterol (VENTOLIN HFA) 108 (90 Base) MCG/ACT inhaler Inhale 2 puffs into the lungs every 6 (six) hours as needed for wheezing or shortness of breath. 8 g 6  . Calcium Carb-Cholecalciferol (CALCIUM + D3 PO) Take 1 tablet by mouth daily.     [name] EPINEPHrine (EPIPEN 2-PAK) 0.3 mg/0.3 mL IJ SOAJ injection See admin instructions.    [name] levothyroxine (SYNTHROID) 50 MCG tablet Take 50 mcg by mouth every other day. Alternating with 75 mcg    . levothyroxine (SYNTHROID) 75 MCG tablet Take 75 mcg by mouth every other day. Alternating with 50 mcg    . lidocaine (LIDODERM) 5 % Place 1 patch onto the skin daily as needed (pain). Remove & Discard patch within 12 hours or as directed by MD    . magnesium oxide (MAG-OX) 400 MG tablet Take 400 mg by mouth daily.    . Multiple Vitamins-Minerals (OCUVITE EYE HEALTH FORMULA PO) Take 1 tablet by mouth daily.    [name] omalizumab Arvid Right) 150 MG/ML  prefilled syringe Inject 150 mg into the skin every 28 (twenty-eight) days.    [name] OVER THE COUNTER MEDICATION Take 6 capsules by mouth daily. Balance of Nature Fruits & Veggies supplement (3 Veggies + 3 Fruit)    . polyvinyl alcohol (LIQUIFILM TEARS) 1.4 % ophthalmic solution Place 1 drop into both eyes as needed for dry eyes.     No current facility-administered medications on file prior to visit.    Allergies  Allergen Reactions  . Codeine Nausea Only and Other (See Comments)    Severe headaches   . Sulfa Antibiotics Nausea And Vomiting  . Voltaren [Diclofenac Sodium] Itching and Rash    Review of Systems No fevers, chills, nausea, muscle aches, no difficulty breathing, no calf pain, no chest pain or shortness of breath.   Physical Exam  GENERAL APPEARANCE: Alert, conversant. Appropriately groomed. No acute distress.   VASCULAR: Pedal pulses palpable DP and PT bilateral.  Capillary refill time is immediate to all digits,  Proximal to distal cooling it warm to warm.  Digital perfusion adequate.   NEUROLOGIC: sensation is intact to 5.07 monofilament at 5/5 sites bilateral.  Light touch is intact bilateral, vibratory sensation intact bilateral  MUSCULOSKELETAL: acceptable muscle strength, tone and stability bilateral.  No gross boney pedal deformities noted. Pain  on palpation along the posterior tibial tendon at its insertion and proximally is noted along the right foot and ankle.  No swelling or ecchymosis noted.    DERMATOLOGIC: skin is warm, supple, and dry.  No open lesions noted.  No rash, no pre ulcerative lesions. Digital nails are asymptomatic.  Small callus noted submet 1 - right foot.    xrays reveal no acute osseous abnormalities seen.  2 screws are noted in the first metatarsal head from a prior surgery.  Ankle joint appears to have good joint space with no sign of fracture.  Small posterior ossicle from the talar process noted but not in an area of pain.     Assessment      ICD-10-CM   1. Posterior tibial tendinitis of right lower extremity  M76.821   2. Soft tissue injury of right foot, initial encounter  [phone]      Plan  Treatment options and alternatives were discussed.  I recommended a brace to help hold up her arch and a plantar fascial brace was dispensed to go along with the inserts she currently wears. She tried a powerstep but said it was uncomfortable for her.  I also recommended a topical pain cream and I will have this formulated for her through [employer].  I will also have her do some physical therapy for strengthing of the posterior tibial tendon muscle and surrounding muscle groups.  She will return in 3-4 weeks for follow up and re evaluation of pain.   * I sent a prescription to [facility] as she has a therapist there - Zoe, she likes working with. Notes were also sent with the prescription.

## 2020-08-24 DIAGNOSIS — L501 Idiopathic urticaria: Secondary | ICD-10-CM | POA: Diagnosis not present

## 2020-08-31 DIAGNOSIS — R262 Difficulty in walking, not elsewhere classified: Secondary | ICD-10-CM | POA: Diagnosis not present

## 2020-08-31 DIAGNOSIS — R600 Localized edema: Secondary | ICD-10-CM | POA: Diagnosis not present

## 2020-08-31 DIAGNOSIS — R531 Weakness: Secondary | ICD-10-CM | POA: Diagnosis not present

## 2020-08-31 DIAGNOSIS — M76821 Posterior tibial tendinitis, right leg: Secondary | ICD-10-CM | POA: Diagnosis not present

## 2020-08-31 DIAGNOSIS — M25571 Pain in right ankle and joints of right foot: Secondary | ICD-10-CM | POA: Diagnosis not present

## 2020-09-02 ENCOUNTER — Other Ambulatory Visit: Payer: Self-pay

## 2020-09-02 ENCOUNTER — Ambulatory Visit
Admission: RE | Admit: 2020-09-02 | Discharge: 2020-09-02 | Disposition: A | Discharge status: Home / Self Care | Payer: Medicare PPO | Source: Ambulatory Visit | Attending: Family Medicine | Admitting: Family Medicine

## 2020-09-02 DIAGNOSIS — M8589 Other specified disorders of bone density and structure, multiple sites: Secondary | ICD-10-CM | POA: Diagnosis not present

## 2020-09-02 DIAGNOSIS — Z13820 Encounter for screening for osteoporosis: Secondary | ICD-10-CM

## 2020-09-02 DIAGNOSIS — Z780 Asymptomatic menopausal state: Secondary | ICD-10-CM | POA: Diagnosis not present

## 2020-09-02 DIAGNOSIS — M810 Age-related osteoporosis without current pathological fracture: Secondary | ICD-10-CM | POA: Diagnosis not present

## 2020-09-03 DIAGNOSIS — M76821 Posterior tibial tendinitis, right leg: Secondary | ICD-10-CM | POA: Diagnosis not present

## 2020-09-03 DIAGNOSIS — M25571 Pain in right ankle and joints of right foot: Secondary | ICD-10-CM | POA: Diagnosis not present

## 2020-09-03 DIAGNOSIS — R262 Difficulty in walking, not elsewhere classified: Secondary | ICD-10-CM | POA: Diagnosis not present

## 2020-09-03 DIAGNOSIS — R531 Weakness: Secondary | ICD-10-CM | POA: Diagnosis not present

## 2020-09-03 DIAGNOSIS — R600 Localized edema: Secondary | ICD-10-CM | POA: Diagnosis not present

## 2020-09-07 DIAGNOSIS — R600 Localized edema: Secondary | ICD-10-CM | POA: Diagnosis not present

## 2020-09-07 DIAGNOSIS — R531 Weakness: Secondary | ICD-10-CM | POA: Diagnosis not present

## 2020-09-07 DIAGNOSIS — R262 Difficulty in walking, not elsewhere classified: Secondary | ICD-10-CM | POA: Diagnosis not present

## 2020-09-07 DIAGNOSIS — M76821 Posterior tibial tendinitis, right leg: Secondary | ICD-10-CM | POA: Diagnosis not present

## 2020-09-07 DIAGNOSIS — M25571 Pain in right ankle and joints of right foot: Secondary | ICD-10-CM | POA: Diagnosis not present

## 2020-09-09 DIAGNOSIS — M25571 Pain in right ankle and joints of right foot: Secondary | ICD-10-CM | POA: Diagnosis not present

## 2020-09-09 DIAGNOSIS — R262 Difficulty in walking, not elsewhere classified: Secondary | ICD-10-CM | POA: Diagnosis not present

## 2020-09-09 DIAGNOSIS — R600 Localized edema: Secondary | ICD-10-CM | POA: Diagnosis not present

## 2020-09-09 DIAGNOSIS — R531 Weakness: Secondary | ICD-10-CM | POA: Diagnosis not present

## 2020-09-09 DIAGNOSIS — M76821 Posterior tibial tendinitis, right leg: Secondary | ICD-10-CM | POA: Diagnosis not present

## 2020-09-14 DIAGNOSIS — R262 Difficulty in walking, not elsewhere classified: Secondary | ICD-10-CM | POA: Diagnosis not present

## 2020-09-14 DIAGNOSIS — R531 Weakness: Secondary | ICD-10-CM | POA: Diagnosis not present

## 2020-09-14 DIAGNOSIS — M25571 Pain in right ankle and joints of right foot: Secondary | ICD-10-CM | POA: Diagnosis not present

## 2020-09-14 DIAGNOSIS — M76821 Posterior tibial tendinitis, right leg: Secondary | ICD-10-CM | POA: Diagnosis not present

## 2020-09-14 DIAGNOSIS — R600 Localized edema: Secondary | ICD-10-CM | POA: Diagnosis not present

## 2020-09-16 DIAGNOSIS — M76821 Posterior tibial tendinitis, right leg: Secondary | ICD-10-CM | POA: Diagnosis not present

## 2020-09-16 DIAGNOSIS — M25571 Pain in right ankle and joints of right foot: Secondary | ICD-10-CM | POA: Diagnosis not present

## 2020-09-16 DIAGNOSIS — R600 Localized edema: Secondary | ICD-10-CM | POA: Diagnosis not present

## 2020-09-16 DIAGNOSIS — R262 Difficulty in walking, not elsewhere classified: Secondary | ICD-10-CM | POA: Diagnosis not present

## 2020-09-16 DIAGNOSIS — R531 Weakness: Secondary | ICD-10-CM | POA: Diagnosis not present

## 2020-09-20 DIAGNOSIS — R262 Difficulty in walking, not elsewhere classified: Secondary | ICD-10-CM | POA: Diagnosis not present

## 2020-09-20 DIAGNOSIS — M76821 Posterior tibial tendinitis, right leg: Secondary | ICD-10-CM | POA: Diagnosis not present

## 2020-09-20 DIAGNOSIS — R600 Localized edema: Secondary | ICD-10-CM | POA: Diagnosis not present

## 2020-09-20 DIAGNOSIS — M25571 Pain in right ankle and joints of right foot: Secondary | ICD-10-CM | POA: Diagnosis not present

## 2020-09-20 DIAGNOSIS — R531 Weakness: Secondary | ICD-10-CM | POA: Diagnosis not present

## 2020-09-22 DIAGNOSIS — L501 Idiopathic urticaria: Secondary | ICD-10-CM | POA: Diagnosis not present

## 2020-09-23 DIAGNOSIS — R531 Weakness: Secondary | ICD-10-CM | POA: Diagnosis not present

## 2020-09-23 DIAGNOSIS — R600 Localized edema: Secondary | ICD-10-CM | POA: Diagnosis not present

## 2020-09-23 DIAGNOSIS — M76821 Posterior tibial tendinitis, right leg: Secondary | ICD-10-CM | POA: Diagnosis not present

## 2020-09-23 DIAGNOSIS — R262 Difficulty in walking, not elsewhere classified: Secondary | ICD-10-CM | POA: Diagnosis not present

## 2020-09-23 DIAGNOSIS — M25571 Pain in right ankle and joints of right foot: Secondary | ICD-10-CM | POA: Diagnosis not present

## 2020-09-28 DIAGNOSIS — R600 Localized edema: Secondary | ICD-10-CM | POA: Diagnosis not present

## 2020-09-28 DIAGNOSIS — R262 Difficulty in walking, not elsewhere classified: Secondary | ICD-10-CM | POA: Diagnosis not present

## 2020-09-28 DIAGNOSIS — R531 Weakness: Secondary | ICD-10-CM | POA: Diagnosis not present

## 2020-09-28 DIAGNOSIS — M76821 Posterior tibial tendinitis, right leg: Secondary | ICD-10-CM | POA: Diagnosis not present

## 2020-09-28 DIAGNOSIS — M25571 Pain in right ankle and joints of right foot: Secondary | ICD-10-CM | POA: Diagnosis not present

## 2020-10-05 ENCOUNTER — Ambulatory Visit: Payer: Medicare PPO | Admitting: Podiatry

## 2020-10-08 DIAGNOSIS — M2550 Pain in unspecified joint: Secondary | ICD-10-CM | POA: Diagnosis not present

## 2020-10-08 DIAGNOSIS — M8588 Other specified disorders of bone density and structure, other site: Secondary | ICD-10-CM | POA: Diagnosis not present

## 2020-10-08 DIAGNOSIS — C7A090 Malignant carcinoid tumor of the bronchus and lung: Secondary | ICD-10-CM | POA: Diagnosis not present

## 2020-10-08 DIAGNOSIS — M810 Age-related osteoporosis without current pathological fracture: Secondary | ICD-10-CM | POA: Diagnosis not present

## 2020-10-17 DIAGNOSIS — M1990 Unspecified osteoarthritis, unspecified site: Secondary | ICD-10-CM | POA: Insufficient documentation

## 2020-10-19 ENCOUNTER — Encounter: Payer: Self-pay | Admitting: Podiatry

## 2020-10-19 ENCOUNTER — Other Ambulatory Visit: Payer: Self-pay

## 2020-10-19 ENCOUNTER — Ambulatory Visit (INDEPENDENT_AMBULATORY_CARE_PROVIDER_SITE_OTHER): Payer: Medicare PPO | Admitting: Podiatry

## 2020-10-19 DIAGNOSIS — M778 Other enthesopathies, not elsewhere classified: Secondary | ICD-10-CM | POA: Diagnosis not present

## 2020-10-19 DIAGNOSIS — M25871 Other specified joint disorders, right ankle and foot: Secondary | ICD-10-CM

## 2020-10-19 NOTE — Progress Notes (Deleted)
Cardiology Office Note:    Date:  10/19/2020   ID:  Cynthia Rhodes, DOB 10/26/1945, MRN [id number]  PCP:  [name], DO  [facility] Cardiologist:  Freada Bergeron, MD  [facility] Electrophysiologist:  None   Referring MD: [name], DO    History of Present Illness:    Cynthia Rhodes is a 75 y.o. 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  During our visit on 03/01/20, she was seen for incidental asymptomatic bradycardia. She was not on nodal agents at that time and we decided to continue to monitor.   Had admission to [facility] 04/2020 with left anterior chest pain radiating to left neck and shoulder x2 days after going to the chiropracter. She presented to the ED as the pain was severe 9/10. There,  Initial ECG with TWI in V1 and V2. Repeat showed TWI in V1. She had previous TWI in V1 (ECG 03/01/20 as well as prior), so overall not significantly changed. hsTnI normal at 5-->5. ESR normal, CRP elevated at 2.0. TSH normal. Tchol not at goal, LDL 124. A1c 6.0. TTE with normal BiV function. Thought to be noncardiac in nature as pain reproducible with palpation. She was recommended for symptomatic management and discharged home. Of note, she had very elevated blood pressure and was started on amlodipine prior to discharge  During last visit on 08/20/20, the patient was doing well. Bood pressures better controlled.    Past Medical History:  Diagnosis Date  . Cancer ([place]) 2021  . History of hiatal hernia   . Hypothyroidism   . Peripheral vascular disease ([place])    varicose veins    Past Surgical History:  Procedure Laterality Date  . ABDOMINAL HYSTERECTOMY    . APPENDECTOMY  1954  . BRONCHIAL BIOPSY  11/04/2019   Procedure: BRONCHIAL BIOPSIES;  Surgeon: Collene Gobble, MD;  Location: [facility] ENDOSCOPY;   Service: Pulmonary;;  . BRONCHIAL BRUSHINGS  11/04/2019   Procedure: BRONCHIAL BRUSHINGS;  Surgeon: Collene Gobble, MD;  Location: [facility] ENDOSCOPY;  Service: Pulmonary;;  . BRONCHIAL NEEDLE ASPIRATION BIOPSY  11/04/2019   Procedure: BRONCHIAL NEEDLE ASPIRATION BIOPSIES;  Surgeon: Collene Gobble, MD;  Location: [facility] ENDOSCOPY;  Service: Pulmonary;;  . BRONCHIAL WASHINGS  11/04/2019   Procedure: BRONCHIAL WASHINGS;  Surgeon: Collene Gobble, MD;  Location: [facility] ENDOSCOPY;  Service: Pulmonary;;  . BUNIONECTOMY Bilateral   . CHOLECYSTECTOMY    . [place]  . EYE SURGERY Bilateral 2021  . INTERCOSTAL NERVE BLOCK Right 01/15/2020   Procedure: INTERCOSTAL NERVE BLOCK;  Surgeon: Melrose Nakayama, MD;  Location: [place];  Service: Thoracic;  Laterality: Right;  . JOINT REPLACEMENT Bilateral    Knee  . KNEE SURGERY Bilateral   . LYMPH NODE DISSECTION N/A 01/15/2020   Procedure: LYMPH NODE DISSECTION;  Surgeon: Melrose Nakayama, MD;  Location: [place];  Service: Thoracic;  Laterality: N/A;  . REPLACEMENT TOTAL KNEE BILATERAL    . SIGMOIDECTOMY    . TONSILLECTOMY    . VIDEO BRONCHOSCOPY WITH ENDOBRONCHIAL NAVIGATION N/A 11/04/2019   Procedure: VIDEO BRONCHOSCOPY WITH ENDOBRONCHIAL NAVIGATION;  Surgeon: Collene Gobble, MD;  Location: [place] ENDOSCOPY;  Service: Pulmonary;  Laterality: N/A;  . VIDEO BRONCHOSCOPY WITH ENDOBRONCHIAL NAVIGATION N/A 01/15/2020   Procedure: VIDEO BRONCHOSCOPY WITH ENDOBRONCHIAL NAVIGATION to mark right upper lobe nodule;  Surgeon: Melrose Nakayama, MD;  Location: [facility] OR;  Service: Thoracic;  Laterality: N/A;    Current Medications: No outpatient medications have been marked as taking for the 10/26/20 encounter (Appointment) with Freada Bergeron, MD.     Allergies:   Codeine, Sulfa antibiotics, and Voltaren [diclofenac sodium]   Social History   Socioeconomic History  . Marital status: Widowed    Spouse name: Not on file  . Number of children: Not on file   . Years of education: Not on file  . Highest education level: Not on file  Occupational History  . Not on file  Tobacco Use  . Smoking status: Never Smoker  . Smokeless tobacco: Never Used  Vaping Use  . Vaping Use: Never used  Substance and Sexual Activity  . Alcohol use: Yes    Alcohol/week: 7.0 standard drinks    Types: 7 Glasses of wine per week  . Drug use: Never  . Sexual activity: Not Currently  Other Topics Concern  . Not on file  Social History Narrative  . Not on file   Social Determinants of Health   Financial Resource Strain: Not on file  Food Insecurity: Not on file  Transportation Needs: Not on file  Physical Activity: Not on file  Stress: Not on file  Social Connections: Not on file     Family History: The patient's family history includes Asthma in her mother; Breast cancer in her mother.  ROS:   Please see the history of present illness.    Review of Systems  Constitutional: Negative for chills and fever.  HENT: Negative for hearing loss.   Eyes: Negative for blurred vision.  Respiratory: Negative for shortness of breath.   Cardiovascular: Negative for chest pain, palpitations, orthopnea, claudication, leg swelling and PND.  Gastrointestinal: Positive for constipation. Negative for nausea and vomiting.  Genitourinary: Negative for hematuria.  Musculoskeletal: Positive for back pain and myalgias.  Neurological: Negative for dizziness and loss of consciousness.  Endo/Heme/Allergies: Negative for polydipsia.  Psychiatric/Behavioral: Negative for depression.    EKGs/Labs/Other Studies Reviewed:    The following studies were reviewed today: CT chest 09/2019: IMPRESSION: 1. 2.8 x 1.6 cm spiculated mass with pleural tail is noted in the right middle lobe consistent with primary malignancy. PET scan is recommended for further evaluation. These results will be called to the ordering clinician or representative by the Radiologist Assistant, and  communication documented in the PACS or zVision Dashboard. 2. 7 x 5 mm nodule is noted in right upper lobe concerning for metastatic lesion. 3. Mild cardiomegaly.  ECG 01/13/20: Sinus braydcardia; no block.  Echo 04/25/20 1. Left ventricular ejection fraction, by estimation, is 65 to 70%. The  left ventricle has normal function. The left ventricle has no regional  wall motion abnormalities. Indeterminate diastolic filling due to E-A  fusion.  2. Right ventricular systolic function is normal. The right ventricular  size is normal. Tricuspid regurgitation signal is inadequate for assessing  PA pressure.  3. The mitral valve is normal in structure. No evidence of mitral valve  regurgitation. No evidence of mitral stenosis. Moderate mitral annular  calcification.  4. The aortic valve is grossly normal. There is mild calcification of the  aortic valve. There is mild thickening of the aortic valve. Aortic valve  regurgitation is not visualized. Mild aortic valve sclerosis is present,  with no evidence of aortic valve  stenosis.  5. Pulmonic valve regurgitation not visualized.  6. The inferior vena cava is normal in  size with greater than 50%  respiratory variability, suggesting right atrial pressure of 3 mmHg.   Comparison(s): No prior Echocardiogram  CTA chest 04/24/20: IMPRESSION: 1. Negative for pulmonary embolism. 2. Postsurgical changes in right lung with a small amount of new fluid or low-density in the right hilar region adjacent to the right middle lobectomy site. This fluid or low-density is nonspecific but appears to be new since 03/31/2020. Consider cardiothoracic consultation with regards to this finding. 3. No acute disease in the lungs. Stable small pulmonary nodules. Recommend attention to the small nodules on follow-up imaging.  EKG:  EKG is 04/26/20.  The ekg ordered today demonstrates NSR with TWI in V1 (stable)  Recent Labs: 04/24/2020: Magnesium 2.2; TSH  1.397 08/05/2020: ALT 46; BUN 19; Creatinine 0.86; Hemoglobin 13.2; Platelet Count 281; Potassium 3.9; Sodium 141  Recent Lipid Panel    Component Value Date/Time   CHOL 205 (H) 04/24/2020 2311   TRIG 74 04/24/2020 2311   HDL 66 04/24/2020 2311   CHOLHDL 3.1 04/24/2020 2311   VLDL 15 04/24/2020 2311   LDLCALC 124 (H) 04/24/2020 2311      Physical Exam:    VS:  There were no vitals taken for this visit.    Wt Readings from Last 3 Encounters:  08/10/20 157 lb 12.8 oz (71.6 kg)  04/27/20 155 lb 6.4 oz (70.5 kg)  04/24/20 153 lb 7 oz (69.6 kg)     GEN:  Well nourished, well developed in no acute distress HEENT: Normal NECK: No JVD; No carotid bruits CARDIAC: RRR, no murmurs, rubs, gallops RESPIRATORY:  Clear to auscultation without rales, wheezing or rhonchi  ABDOMEN: Soft, non-tender, non-distended MUSCULOSKELETAL:  No edema; No deformity  SKIN: Warm and dry NEUROLOGIC:  Alert and oriented x 3 PSYCHIATRIC:  Normal affect   ASSESSMENT:    No diagnosis found. PLAN:    In order of problems listed above:  #Noncardiac Chest Pain: Developed after having work done by her chiropractor. Presented to [facility] ED with neck and shoulder pain. Cardiac work-up including trops, ECG and TTE unremarkable. Pain thought to be MSK now improving. -Continue symptomatic management as not cardiac in nature -TTE with normal BiV function, no significant valvular abnormalities -ECG without ischemia -Trop negative  #Asymptomatic Sinus bradycardia: Patient with asymptomatic bradycardia with HR 50-60s. No evidence of conduction disease on ECG. Denies chest pain, lightheadedness, dizziness, SOB or syncope. Able to exert herself without symptoms. Very active at baseline. Not on nodal agents or herbal supplements. -Reassuringly asymptomatic without evidence of block -Not on nodal agents -TSH normal on current synthroid dosing -Will continue to monitor but no intervention needed from CV  standpoint  #Hypertension Contolled. -Continue amlodipine 13m daily -Continue healthy diet and exercise with goal 150 minutes of moderate exercise per week  #HLD: -Continue atorvastatin 44mdaily  -Repeat cholesterol panel   #RUL and RML carcinoid: S/p resection in 12/2019. -Follow-up with pulm as scheduled -CTA chest in ED shows fluid or low-density in the right hilar region adjacent to the right middle lobectomy site. This is nonspecific but appears to be new since 03/31/2020--told patient to follow-up with pulm to ensure no further work-up/imaging needed     Medication Adjustments/Labs and Tests Ordered: Current medicines are reviewed at length with the patient today.  Concerns regarding medicines are outlined above.  No orders of the defined types were placed in this encounter.  No orders of the defined types were placed in this encounter.   There are no Patient Instructions  on file for this visit.   Signed, Freada Bergeron, MD  10/19/2020 11:39 AM    [facility]

## 2020-10-19 NOTE — Progress Notes (Signed)
  Subjective:  Patient ID: Cynthia Rhodes, female    DOB: Oct 14, 1945,  MRN: [id number]  Chief Complaint  Patient presents with  . Foot Pain      recheck posterior tibial tendon right    75 y.o. female presents with the above complaint. History confirmed with patient.  She previously saw Dr. Rolley Sims for this a few months ago.  She said she does not have much pain on the inside of the arch now she did not get much benefit from the brace either.  She started to do physical therapy but it did not help and so has stopped because they were worried that she may be doing injury.  It is bad as it was before she saw [place].  She thinks this is the same ankle that she broke many years ago  Objective:  Physical Exam: warm, good capillary refill, no trophic changes or ulcerative lesions, normal DP and PT pulses and normal sensory exam.  Right Foot: Pain with resisted dorsiflexion along the tibialis anterior and extensor tendon complex, she has pain over the anterior joint line especially with dorsiflexion, no gross instability  Radiographs: X-ray of right foot and ankle: no fracture, dislocation, swelling or degenerative changes noted, no fracture Assessment:   1. Impingement syndrome of right ankle   2. Extensor tendinitis of foot      Plan:  Patient was evaluated and treated and all questions answered.  Reviewed radiographic and clinical exam findings with the patient.  I discussed with her that she appears to have ankle impingement, likely sequela of previous old injury.  She also does have some extensor tendinitis.  Recommended rest and immobilization in a Tri-Lock ankle brace.  She has issues with balance and I think a CAM boot would be detrimental for her.  I would like to order MRI to evaluate the ankle joint as well as any impinging structures and synovitis in the front of the joint.  Could consider injection if not improving by next visit  Return in about 4 weeks (around 11/16/2020) for after  MRI to review.

## 2020-10-20 DIAGNOSIS — L501 Idiopathic urticaria: Secondary | ICD-10-CM | POA: Diagnosis not present

## 2020-10-21 ENCOUNTER — Other Ambulatory Visit: Payer: Self-pay

## 2020-10-21 ENCOUNTER — Telehealth: Payer: Self-pay | Admitting: Podiatry

## 2020-10-21 ENCOUNTER — Other Ambulatory Visit: Payer: Medicare PPO | Admitting: *Deleted

## 2020-10-21 DIAGNOSIS — E785 Hyperlipidemia, unspecified: Secondary | ICD-10-CM

## 2020-10-21 LAB — LIPID PANEL
Chol/HDL Ratio: 2.8 ratio (ref 0.0–4.4)
Cholesterol, Total: 227 mg/dL — ABNORMAL HIGH (ref 100–199)
HDL: 80 mg/dL (ref >39)
LDL Chol Calc (NIH): 135 mg/dL — ABNORMAL HIGH (ref 0–99)
Triglycerides: 69 mg/dL (ref 0–149)
VLDL Cholesterol Cal: 12 mg/dL (ref 5–40)

## 2020-10-21 NOTE — Telephone Encounter (Signed)
Pt is calling to follow up on the MRI referral to [facility]

## 2020-10-21 NOTE — Telephone Encounter (Signed)
I've ordered it and should be OK to schedule, they will call

## 2020-10-21 NOTE — Addendum Note (Signed)
Addended bySherryle Lis, Cardelia Sassano R on: 10/21/2020 02:17 PM   Modules accepted: Orders

## 2020-10-26 ENCOUNTER — Ambulatory Visit (INDEPENDENT_AMBULATORY_CARE_PROVIDER_SITE_OTHER): Payer: Medicare PPO | Admitting: Cardiology

## 2020-10-26 ENCOUNTER — Encounter: Payer: Self-pay | Admitting: Cardiology

## 2020-10-26 ENCOUNTER — Other Ambulatory Visit: Payer: Self-pay

## 2020-10-26 VITALS — BP 136/80 | HR 71 | Ht 66.0 in | Wt 162.6 lb

## 2020-10-26 DIAGNOSIS — Z79899 Other long term (current) drug therapy: Secondary | ICD-10-CM | POA: Diagnosis not present

## 2020-10-26 DIAGNOSIS — I10 Essential (primary) hypertension: Secondary | ICD-10-CM | POA: Diagnosis not present

## 2020-10-26 DIAGNOSIS — R0789 Other chest pain: Secondary | ICD-10-CM

## 2020-10-26 DIAGNOSIS — E785 Hyperlipidemia, unspecified: Secondary | ICD-10-CM | POA: Diagnosis not present

## 2020-10-26 DIAGNOSIS — R001 Bradycardia, unspecified: Secondary | ICD-10-CM | POA: Diagnosis not present

## 2020-10-26 DIAGNOSIS — E7800 Pure hypercholesterolemia, unspecified: Secondary | ICD-10-CM | POA: Diagnosis not present

## 2020-10-26 MED ORDER — RED YEAST RICE 600 MG PO TABS: 600.0000 mg | ORAL_TABLET | Freq: Every day | ORAL | 1 refills | Status: DC

## 2020-10-26 NOTE — Patient Instructions (Addendum)
Medication Instructions:   START TAKING RED YEAST RICE 600 MG BY MOUTH DAILY  *If you need a refill on your cardiac medications before your next appointment, please call your pharmacy*   You have been referred to [place] APPOINTMENT--YOU WILL SEE OUR PHARMACIST FOR FURTHER MANAGEMENT OF YOUR BLOOD PRESSURE, LIPIDS, AND NUTRITION COUNSELING   Follow-Up:  WITH HYPERTENSION/LIPID CLINIC AT [place]   At [facility] you and your health needs are our priority.  As part of our continuing mission to provide you with exceptional heart care, we have created designated Provider Care Teams.  These Care Teams include your primary Cardiologist (physician) and Advanced Practice Providers (APPs -  Physician Assistants and Nurse Practitioners) who all work together to provide you with the care you need, when you need it.  We recommend signing up for the patient portal called "MyChart".  Sign up information is provided on this After Visit Summary.  MyChart is used to connect with patients for Virtual Visits (Telemedicine).  Patients are able to view lab/test results, encounter notes, upcoming appointments, etc.  Non-urgent messages can be sent to your provider as well.   To learn more about what you can do with MyChart, go to [email or web address].    Your next appointment:   1 year(s)  The format for your next appointment:   In Person  Provider:   Gwyndolyn Kaufman, MD   Other Instructions    Mediterranean Diet A Mediterranean diet refers to food and lifestyle choices that are based on the traditions of countries located on the [place]. This way of eating has been shown to help prevent certain conditions and improve outcomes for people who have chronic diseases, like kidney disease and heart disease. What are tips for following this plan? Lifestyle  Cook and eat meals together with your  family, when possible.  Drink enough fluid to keep your urine clear or pale yellow.  Be physically active every day. This includes: ? Aerobic exercise like running or swimming. ? Leisure activities like gardening, walking, or housework.  Get 7-8 hours of sleep each night.  If recommended by your health care provider, drink red wine in moderation. This means 1 glass a day for nonpregnant women and 2 glasses a day for men. A glass of wine equals 5 oz (150 mL). Reading food labels  Check the serving size of packaged foods. For foods such as rice and pasta, the serving size refers to the amount of cooked product, not dry.  Check the total fat in packaged foods. Avoid foods that have saturated fat or trans fats.  Check the ingredients list for added sugars, such as corn syrup.   Shopping  At the grocery store, buy most of your food from the areas near the walls of the store. This includes: ? Fresh fruits and vegetables (produce). ? Grains, beans, nuts, and seeds. Some of these may be available in unpackaged forms or large amounts (in bulk). ? Fresh seafood. ? Poultry and eggs. ? Low-fat dairy products.  Buy whole ingredients instead of prepackaged foods.  Buy fresh fruits and vegetables in-season from local farmers markets.  Buy frozen fruits and vegetables in resealable bags.  If you do not have access to quality fresh seafood, buy precooked frozen shrimp or canned fish, such as tuna, salmon, or sardines.  Buy small amounts of raw or cooked vegetables, salads, or olives from the deli or salad  bar at your store.  Stock your pantry so you always have certain foods on hand, such as olive oil, canned tuna, canned tomatoes, rice, pasta, and beans. Cooking  Cook foods with extra-virgin olive oil instead of using butter or other vegetable oils.  Have meat as a side dish, and have vegetables or grains as your main dish. This means having meat in small portions or adding small amounts of  meat to foods like pasta or stew.  Use beans or vegetables instead of meat in common dishes like chili or lasagna.  Experiment with different cooking methods. Try roasting or broiling vegetables instead of steaming or sauteing them.  Add frozen vegetables to soups, stews, pasta, or rice.  Add nuts or seeds for added healthy fat at each meal. You can add these to yogurt, salads, or vegetable dishes.  Marinate fish or vegetables using olive oil, lemon juice, garlic, and fresh herbs. Meal planning  Plan to eat 1 vegetarian meal one day each week. Try to work up to 2 vegetarian meals, if possible.  Eat seafood 2 or more times a week.  Have healthy snacks readily available, such as: ? Vegetable sticks with hummus. ? [place] yogurt. ? Fruit and nut trail mix.  Eat balanced meals throughout the week. This includes: ? Fruit: 2-3 servings a day ? Vegetables: 4-5 servings a day ? Low-fat dairy: 2 servings a day ? Fish, poultry, or lean meat: 1 serving a day ? Beans and legumes: 2 or more servings a week ? Nuts and seeds: 1-2 servings a day ? Whole grains: 6-8 servings a day ? Extra-virgin olive oil: 3-4 servings a day  Limit red meat and sweets to only a few servings a month   What are my food choices?  Mediterranean diet ? Recommended  Grains: Whole-grain pasta. Brown rice. Bulgar wheat. Polenta. Couscous. Whole-wheat bread. [place].  Vegetables: Artichokes. Beets. Broccoli. Cabbage. Carrots. Eggplant. Green beans. Chard. Kale. Spinach. Onions. Leeks. Peas. Squash. Tomatoes. Peppers. Radishes.  Fruits: Apples. Apricots. Avocado. Berries. Bananas. Cherries. Dates. Figs. Grapes. Lemons. Melon. Oranges. Peaches. Plums. Pomegranate.  Meats and other protein foods: Beans. Almonds. Sunflower seeds. Pine nuts. Peanuts. [place]. Salmon. Scallops. Shrimp. [place]. Tilapia. Clams. Oysters. Eggs.  Dairy: Low-fat milk. Cheese. Greek yogurt.  Beverages: Water. Red wine. Herbal tea.  Fats  and oils: Extra virgin olive oil. Avocado oil. Grape seed oil.  Sweets and desserts: [place] yogurt with honey. Baked apples. Poached pears. Trail mix.  Seasoning and other foods: Basil. Cilantro. Coriander. Cumin. Mint. Parsley. Sage. Rosemary. Tarragon. Garlic. Oregano. Thyme. Pepper. Balsalmic vinegar. Tahini. Hummus. Tomato sauce. Olives. Mushrooms. ? Limit these  Grains: Prepackaged pasta or rice dishes. Prepackaged cereal with added sugar.  Vegetables: Deep fried potatoes (french fries).  Fruits: Fruit canned in syrup.  Meats and other protein foods: Beef. Pork. Lamb. Poultry with skin. Hot dogs. [place] Salines.  Dairy: Ice cream. Sour cream. Whole milk.  Beverages: Juice. Sugar-sweetened soft drinks. Beer. Liquor and spirits.  Fats and oils: Butter. Canola oil. Vegetable oil. Beef fat (tallow). Lard.  Sweets and desserts: Cookies. Cakes. Pies. Candy.  Seasoning and other foods: Mayonnaise. Premade sauces and marinades. The items listed may not be a complete list. Talk with your dietitian about what dietary choices are right for you. Summary  The Mediterranean diet includes both food and lifestyle choices.  Eat a variety of fresh fruits and vegetables, beans, nuts, seeds, and whole grains.  Limit the amount of red meat and sweets that you eat.  Talk with your health care provider about whether it is safe for you to drink red wine in moderation. This means 1 glass a day for nonpregnant women and 2 glasses a day for men. A glass of wine equals 5 oz (150 mL). This information is not intended to replace advice given to you by your health care provider. Make sure you discuss any questions you have with your health care provider. Document Revised: 01/06/2016 Document Reviewed: 12/30/2015 Elsevier Patient Education  [place].

## 2020-10-26 NOTE — Progress Notes (Signed)
Cardiology Office Note:    Date:  10/26/2020   ID:  Cynthia Rhodes, DOB 02-Jul-1945, MRN [id number]  PCP:  [name], DO  [facility] Cardiologist:  Freada Bergeron, MD  [facility] Electrophysiologist:  None   Referring MD: [name], DO    History of Present Illness:    Cynthia Rhodes is a 75 y.o. 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  During our visit on 03/01/20, she was seen for incidental asymptomatic bradycardia. She was not on nodal agents at that time and we decided to continue to monitor.   Had admission to [facility] 04/2020 with left anterior chest pain radiating to left neck and shoulder x2 days after going to the chiropracter. She presented to the ED as the pain was severe 9/10. There,  Initial ECG with TWI in V1 and V2. Repeat showed TWI in V1. She had previous TWI in V1 (ECG 03/01/20 as well as prior), so overall not significantly changed. hsTnI normal at 5-->5. ESR normal, CRP elevated at 2.0. TSH normal. Tchol not at goal, LDL 124. A1c 6.0. TTE with normal BiV function. Thought to be noncardiac in nature as pain reproducible with palpation. She was recommended for symptomatic management and discharged home. Of note, she had very elevated blood pressure and was started on amlodipine prior to discharge  During last visit on 08/20/20, the patient was doing well. Bood pressures better controlled.  Today, she reports feeling well. Her home HR averages 130/80. She denies her HR ever dropping in the 40s. She has not been taking amlodipine or atorvastatin as she wants to pursue a natural route rather than proceed with medications. BP Readings from Last 3 Encounters:  10/26/20 136/80  08/10/20 (!) 155/84  04/27/20 114/70   She walks daily but she is requesting for a referral for a dietitian  because her goal is to lose 20 more pounds. She is interested in making lifestyle changes to improve and maintain her blood pressure and cholesterol to avoid medications.  She denies exertional palpitations, shortness of breath, chest pain, tightness or pressure. Has trace LE edema swelling secondary to chronic venous stasis which is table. She denies having PND, orthopnea, lightheadedness or syncopal episodes.   Past Medical History:  Diagnosis Date  . Cancer ([place]) 2021  . History of hiatal hernia   . Hypothyroidism   . Peripheral vascular disease ([place])    varicose veins    Past Surgical History:  Procedure Laterality Date  . ABDOMINAL HYSTERECTOMY    . APPENDECTOMY  1954  . BRONCHIAL BIOPSY  11/04/2019   Procedure: BRONCHIAL BIOPSIES;  Surgeon: Collene Gobble, MD;  Location: [facility] ENDOSCOPY;  Service: Pulmonary;;  . BRONCHIAL BRUSHINGS  11/04/2019   Procedure: BRONCHIAL BRUSHINGS;  Surgeon: Collene Gobble, MD;  Location: [facility] ENDOSCOPY;  Service: Pulmonary;;  . BRONCHIAL NEEDLE ASPIRATION BIOPSY  11/04/2019   Procedure: BRONCHIAL NEEDLE ASPIRATION BIOPSIES;  Surgeon: Collene Gobble, MD;  Location: [facility] ENDOSCOPY;  Service: Pulmonary;;  . BRONCHIAL WASHINGS  11/04/2019   Procedure: BRONCHIAL WASHINGS;  Surgeon: Collene Gobble, MD;  Location: [facility] ENDOSCOPY;  Service: Pulmonary;;  . BUNIONECTOMY Bilateral   . CHOLECYSTECTOMY    . [place]  . EYE SURGERY Bilateral 2021  . INTERCOSTAL NERVE BLOCK Right 01/15/2020   Procedure: INTERCOSTAL NERVE BLOCK;  Surgeon: Melrose Nakayama, MD;  Location: [place];  Service: Thoracic;  Laterality: Right;  . JOINT REPLACEMENT Bilateral    Knee  . KNEE SURGERY Bilateral   . LYMPH NODE DISSECTION N/A 01/15/2020   Procedure: LYMPH NODE DISSECTION;  Surgeon: Melrose Nakayama, MD;  Location: [place];  Service: Thoracic;  Laterality: N/A;  . REPLACEMENT TOTAL KNEE BILATERAL    . SIGMOIDECTOMY    . TONSILLECTOMY    . VIDEO BRONCHOSCOPY WITH  ENDOBRONCHIAL NAVIGATION N/A 11/04/2019   Procedure: VIDEO BRONCHOSCOPY WITH ENDOBRONCHIAL NAVIGATION;  Surgeon: Collene Gobble, MD;  Location: [place] ENDOSCOPY;  Service: Pulmonary;  Laterality: N/A;  . VIDEO BRONCHOSCOPY WITH ENDOBRONCHIAL NAVIGATION N/A 01/15/2020   Procedure: VIDEO BRONCHOSCOPY WITH ENDOBRONCHIAL NAVIGATION to mark right upper lobe nodule;  Surgeon: Melrose Nakayama, MD;  Location: [facility] OR;  Service: Thoracic;  Laterality: N/A;    Current Medications: Current Meds  Medication Sig  . acetaminophen (TYLENOL) 650 MG CR tablet Take 1,300 mg by mouth every 8 (eight) hours as needed for pain.  [name] albuterol (VENTOLIN HFA) 108 (90 Base) MCG/ACT inhaler Inhale 2 puffs into the lungs every 6 (six) hours as needed for wheezing or shortness of breath.  . Calcium Carb-Cholecalciferol (CALCIUM + D3 PO) Take 1 tablet by mouth daily.   [name] EPINEPHrine 0.3 mg/0.3 mL IJ SOAJ injection See admin instructions.  [name] levothyroxine (SYNTHROID) 50 MCG tablet Take 50 mcg by mouth every other day. Alternating with 75 mcg  . levothyroxine (SYNTHROID) 75 MCG tablet Take 75 mcg by mouth every other day. Alternating with 50 mcg  . lidocaine (LIDODERM) 5 % Place 1 patch onto the skin daily as needed (pain). Remove & Discard patch within 12 hours or as directed by MD  . magnesium oxide (MAG-OX) 400 MG tablet Take 400 mg by mouth daily.  . Multiple Vitamins-Minerals (OCUVITE EYE HEALTH FORMULA PO) Take 1 tablet by mouth daily.  [name] omalizumab Arvid Right) 150 MG/ML prefilled syringe Inject 150 mg into the skin every 28 (twenty-eight) days.  [name] OVER THE COUNTER MEDICATION Take 6 capsules by mouth daily. Balance of Nature Fruits & Veggies supplement (3 Veggies + 3 Fruit)  . polyvinyl alcohol (LIQUIFILM TEARS) 1.4 % ophthalmic solution Place 1 drop into both eyes as needed for dry eyes.  . Red Yeast Rice 600 MG TABS Take 1 tablet (600 mg total) by mouth daily.     Allergies:   Codeine, Sulfa antibiotics, and Voltaren  [diclofenac sodium]   Social History   Socioeconomic History  . Marital status: Widowed    Spouse name: Not on file  . Number of children: Not on file  . Years of education: Not on file  . Highest education level: Not on file  Occupational History  . Not on file  Tobacco Use  . Smoking status: Never Smoker  . Smokeless tobacco: Never Used  Vaping Use  . Vaping Use: Never used  Substance and Sexual Activity  . Alcohol use: Yes    Alcohol/week: 7.0 standard drinks    Types: 7 Glasses of wine per week  . Drug use: Never  . Sexual activity: Not Currently  Other Topics Concern  . Not on file  Social History Narrative  . Not on file   Social Determinants of Health   Financial Resource Strain: Not on file  Food Insecurity: Not on file  Transportation Needs: Not on file  Physical Activity: Not on file  Stress: Not on file  Social Connections: Not  on file     Family History: The patient's family history includes Asthma in her mother; Breast cancer in her mother.  ROS:   Please see the history of present illness.    Review of Systems  Constitutional: Negative for chills and fever.  HENT: Negative for hearing loss.   Eyes: Negative for blurred vision.  Respiratory: Negative for shortness of breath.   Cardiovascular: Negative for chest pain, palpitations, orthopnea, claudication, leg swelling and PND.  Gastrointestinal: Negative for constipation, nausea and vomiting.  Genitourinary: Negative for hematuria.  Musculoskeletal: Negative for back pain and myalgias.  Neurological: Negative for dizziness and loss of consciousness.  Endo/Heme/Allergies: Negative for polydipsia.  Psychiatric/Behavioral: Negative for depression.    EKGs/Labs/Other Studies Reviewed:    The following studies were reviewed today: CT chest 09/2019: IMPRESSION: 1. 2.8 x 1.6 cm spiculated mass with pleural tail is noted in the right middle lobe consistent with primary malignancy. PET scan  is recommended for further evaluation. These results will be called to the ordering clinician or representative by the Radiologist Assistant, and communication documented in the PACS or zVision Dashboard. 2. 7 x 5 mm nodule is noted in right upper lobe concerning for metastatic lesion. 3. Mild cardiomegaly.  ECG 01/13/20: Sinus braydcardia; no block.  Echo 04/25/20 1. Left ventricular ejection fraction, by estimation, is 65 to 70%. The  left ventricle has normal function. The left ventricle has no regional  wall motion abnormalities. Indeterminate diastolic filling due to E-A  fusion.  2. Right ventricular systolic function is normal. The right ventricular  size is normal. Tricuspid regurgitation signal is inadequate for assessing  PA pressure.  3. The mitral valve is normal in structure. No evidence of mitral valve  regurgitation. No evidence of mitral stenosis. Moderate mitral annular  calcification.  4. The aortic valve is grossly normal. There is mild calcification of the  aortic valve. There is mild thickening of the aortic valve. Aortic valve  regurgitation is not visualized. Mild aortic valve sclerosis is present,  with no evidence of aortic valve  stenosis.  5. Pulmonic valve regurgitation not visualized.  6. The inferior vena cava is normal in size with greater than 50%  respiratory variability, suggesting right atrial pressure of 3 mmHg.   Comparison(s): No prior Echocardiogram  CTA chest 04/24/20: IMPRESSION: 1. Negative for pulmonary embolism. 2. Postsurgical changes in right lung with a small amount of new fluid or low-density in the right hilar region adjacent to the right middle lobectomy site. This fluid or low-density is nonspecific but appears to be new since 03/31/2020. Consider cardiothoracic consultation with regards to this finding. 3. No acute disease in the lungs. Stable small pulmonary nodules. Recommend attention to the small nodules on  follow-up imaging.  EKG:  EKG is 04/26/20.  The ekg ordered today demonstrates NSR with TWI in V1 (stable)  Recent Labs: 04/24/2020: Magnesium 2.2; TSH 1.397 08/05/2020: ALT 46; BUN 19; Creatinine 0.86; Hemoglobin 13.2; Platelet Count 281; Potassium 3.9; Sodium 141  Recent Lipid Panel    Component Value Date/Time   CHOL 227 (H) 10/21/2020 0929   TRIG 69 10/21/2020 0929   HDL 80 10/21/2020 0929   CHOLHDL 2.8 10/21/2020 0929   CHOLHDL 3.1 04/24/2020 2311   VLDL 15 04/24/2020 2311   LDLCALC 135 (H) 10/21/2020 0929      Physical Exam:    VS:  BP 136/80   Pulse 71   Ht 5' 6" (1.676 m)   Wt 162 lb 9.6 oz (73.8  kg)   SpO2 97%   BMI 26.24 kg/m     Wt Readings from Last 3 Encounters:  10/26/20 162 lb 9.6 oz (73.8 kg)  08/10/20 157 lb 12.8 oz (71.6 kg)  04/27/20 155 lb 6.4 oz (70.5 kg)     GEN:  Well nourished, well developed in no acute distress HEENT: Normal NECK: No JVD; No carotid bruits CARDIAC: RRR, no murmurs, rubs, gallops RESPIRATORY:  Clear to auscultation without rales, wheezing or rhonchi  ABDOMEN: Soft, non-tender, non-distended MUSCULOSKELETAL:  Trace bilateral pedal edema, warm; No deformity  SKIN: Warm and dry NEUROLOGIC:  Alert and oriented x 3 PSYCHIATRIC:  Normal affect   ASSESSMENT:    1. Atypical chest pain   2. Hyperlipidemia, unspecified hyperlipidemia type   3. Primary hypertension   4. Medication management   5. Bradycardia   6. Pure hypercholesterolemia    PLAN:    In order of problems listed above:  #Noncardiac Chest Pain: Developed after having work done by her chiropractor. Presented to [facility] ED with neck and shoulder pain. Cardiac work-up including trops, ECG and TTE unremarkable. Pain thought to be MSK now improving. -Continue symptomatic management as not cardiac in nature -TTE with normal BiV function, no significant valvular abnormalities -ECG without ischemia -Trop negative  #Asymptomatic Sinus bradycardia: Patient with  asymptomatic bradycardia with HR 50-60s. No evidence of conduction disease on ECG. Denies chest pain, lightheadedness, dizziness, SOB or syncope. Able to exert herself without symptoms. Very active at baseline. Not on nodal agents or herbal supplements. -Reassuringly asymptomatic without evidence of block -Not on nodal agents -TSH normal on current synthroid dosing -Will continue to monitor but no intervention needed from CV standpoint  #Hypertension Running 130s at home. Patient would like to continue with lifestyle modifications and weight loss prior to taking medications. -Continue healthy diet and exercise with goal 150 minutes of moderate exercise per week -Referred to HTN clinic for dietary education -Will monitor blood pressures at home with goal <120s/80s  #HLD: LDL 135, HDL 80 on 10/2020. Patient would like to trial lifestyle modifications over statins at this time as detailed above. -Start red yeast rice -Continue diet and lifestyle modifications -Referred to PharmD for dietary education  #RUL and RML carcinoid: S/p resection in 12/2019. -Follow-up with pulm as scheduled -CTA chest in ED shows fluid or low-density in the right hilar region adjacent to the right middle lobectomy site. This is nonspecific but appears to be new since 03/31/2020--told patient to follow-up with pulm to ensure no further work-up/imaging needed    Medication Adjustments/Labs and Tests Ordered: Current medicines are reviewed at length with the patient today.  Concerns regarding medicines are outlined above.  Orders Placed This Encounter  Procedures  . AMB Referral to [facility]   Meds ordered this encounter  Medications  . Red Yeast Rice 600 MG TABS    Sig: Take 1 tablet (600 mg total) by mouth daily.    Dispense:  90 tablet    Refill:  1    Patient Instructions   Medication Instructions:   START TAKING RED YEAST RICE 600 MG BY MOUTH DAILY  *If you need a refill on your cardiac  medications before your next appointment, please call your pharmacy*   You have been referred to [place] APPOINTMENT--YOU WILL SEE [place] OF YOUR BLOOD PRESSURE, LIPIDS, AND NUTRITION COUNSELING   Follow-Up:  WITH HYPERTENSION/LIPID CLINIC AT VERY  NEXT AVAILABLE APPOINTMENT   At [facility], you and your health needs are our priority.  As part of our continuing mission to provide you with exceptional heart care, we have created designated Provider Care Teams.  These Care Teams include your primary Cardiologist (physician) and Advanced Practice Providers (APPs -  Physician Assistants and Nurse Practitioners) who all work together to provide you with the care you need, when you need it.  We recommend signing up for the patient portal called "MyChart".  Sign up information is provided on this After Visit Summary.  MyChart is used to connect with patients for Virtual Visits (Telemedicine).  Patients are able to view lab/test results, encounter notes, upcoming appointments, etc.  Non-urgent messages can be sent to your provider as well.   To learn more about what you can do with MyChart, go to [email or web address].    Your next appointment:   1 year(s)  The format for your next appointment:   In Person  Provider:   Gwyndolyn Kaufman, MD   Other Instructions    Mediterranean Diet A Mediterranean diet refers to food and lifestyle choices that are based on the traditions of countries located on the [place]. This way of eating has been shown to help prevent certain conditions and improve outcomes for people who have chronic diseases, like kidney disease and heart disease. What are tips for following this plan? Lifestyle  Cook and eat meals together with your family, when possible.  Drink enough fluid to keep your urine clear or pale yellow.  Be physically active every  day. This includes: ? Aerobic exercise like running or swimming. ? Leisure activities like gardening, walking, or housework.  Get 7-8 hours of sleep each night.  If recommended by your health care provider, drink red wine in moderation. This means 1 glass a day for nonpregnant women and 2 glasses a day for men. A glass of wine equals 5 oz (150 mL). Reading food labels  Check the serving size of packaged foods. For foods such as rice and pasta, the serving size refers to the amount of cooked product, not dry.  Check the total fat in packaged foods. Avoid foods that have saturated fat or trans fats.  Check the ingredients list for added sugars, such as corn syrup.   Shopping  At the grocery store, buy most of your food from the areas near the walls of the store. This includes: ? Fresh fruits and vegetables (produce). ? Grains, beans, nuts, and seeds. Some of these may be available in unpackaged forms or large amounts (in bulk). ? Fresh seafood. ? Poultry and eggs. ? Low-fat dairy products.  Buy whole ingredients instead of prepackaged foods.  Buy fresh fruits and vegetables in-season from local farmers markets.  Buy frozen fruits and vegetables in resealable bags.  If you do not have access to quality fresh seafood, buy precooked frozen shrimp or canned fish, such as tuna, salmon, or sardines.  Buy small amounts of raw or cooked vegetables, salads, or olives from the deli or salad bar at your store.  Stock your pantry so you always have certain foods on hand, such as olive oil, canned tuna, canned tomatoes, rice, pasta, and beans. Cooking  Cook foods with extra-virgin olive oil instead of using butter or other vegetable oils.  Have meat as a side dish, and have vegetables or grains as your main dish. This means having meat in small portions or adding small amounts of meat to foods like pasta  or stew.  Use beans or vegetables instead of meat in common dishes like chili or  lasagna.  Experiment with different cooking methods. Try roasting or broiling vegetables instead of steaming or sauteing them.  Add frozen vegetables to soups, stews, pasta, or rice.  Add nuts or seeds for added healthy fat at each meal. You can add these to yogurt, salads, or vegetable dishes.  Marinate fish or vegetables using olive oil, lemon juice, garlic, and fresh herbs. Meal planning  Plan to eat 1 vegetarian meal one day each week. Try to work up to 2 vegetarian meals, if possible.  Eat seafood 2 or more times a week.  Have healthy snacks readily available, such as: ? Vegetable sticks with hummus. ? [place] yogurt. ? Fruit and nut trail mix.  Eat balanced meals throughout the week. This includes: ? Fruit: 2-3 servings a day ? Vegetables: 4-5 servings a day ? Low-fat dairy: 2 servings a day ? Fish, poultry, or lean meat: 1 serving a day ? Beans and legumes: 2 or more servings a week ? Nuts and seeds: 1-2 servings a day ? Whole grains: 6-8 servings a day ? Extra-virgin olive oil: 3-4 servings a day  Limit red meat and sweets to only a few servings a month   What are my food choices?  Mediterranean diet ? Recommended  Grains: Whole-grain pasta. Brown rice. Bulgar wheat. Polenta. Couscous. Whole-wheat bread. [place].  Vegetables: Artichokes. Beets. Broccoli. Cabbage. Carrots. Eggplant. Green beans. Chard. Kale. Spinach. Onions. Leeks. Peas. Squash. Tomatoes. Peppers. Radishes.  Fruits: Apples. Apricots. Avocado. Berries. Bananas. Cherries. Dates. Figs. Grapes. Lemons. Melon. Oranges. Peaches. Plums. Pomegranate.  Meats and other protein foods: Beans. Almonds. Sunflower seeds. Pine nuts. Peanuts. [place]. Salmon. Scallops. Shrimp. [place]. Tilapia. Clams. Oysters. Eggs.  Dairy: Low-fat milk. Cheese. Greek yogurt.  Beverages: Water. Red wine. Herbal tea.  Fats and oils: Extra virgin olive oil. Avocado oil. Grape seed oil.  Sweets and desserts: [place] yogurt with  honey. Baked apples. Poached pears. Trail mix.  Seasoning and other foods: Basil. Cilantro. Coriander. Cumin. Mint. Parsley. Sage. Rosemary. Tarragon. Garlic. Oregano. Thyme. Pepper. Balsalmic vinegar. Tahini. Hummus. Tomato sauce. Olives. Mushrooms. ? Limit these  Grains: Prepackaged pasta or rice dishes. Prepackaged cereal with added sugar.  Vegetables: Deep fried potatoes (french fries).  Fruits: Fruit canned in syrup.  Meats and other protein foods: Beef. Pork. Lamb. Poultry with skin. Hot dogs. [place] Salines.  Dairy: Ice cream. Sour cream. Whole milk.  Beverages: Juice. Sugar-sweetened soft drinks. Beer. Liquor and spirits.  Fats and oils: Butter. Canola oil. Vegetable oil. Beef fat (tallow). Lard.  Sweets and desserts: Cookies. Cakes. Pies. Candy.  Seasoning and other foods: Mayonnaise. Premade sauces and marinades. The items listed may not be a complete list. Talk with your dietitian about what dietary choices are right for you. Summary  The Mediterranean diet includes both food and lifestyle choices.  Eat a variety of fresh fruits and vegetables, beans, nuts, seeds, and whole grains.  Limit the amount of red meat and sweets that you eat.  Talk with your health care provider about whether it is safe for you to drink red wine in moderation. This means 1 glass a day for nonpregnant women and 2 glasses a day for men. A glass of wine equals 5 oz (150 mL). This information is not intended to replace advice given to you by your health care provider. Make sure you discuss any questions you have with your health care provider. Document Revised: 01/06/2016  Document Reviewed: 12/30/2015 Elsevier Patient Education  [place] as a Education administrator for Freada Bergeron, MD.,have documented all relevant documentation on the behalf of Freada Bergeron, MD,as directed by  Freada Bergeron, MD while in the presence of Freada Bergeron, MD.  I, Freada Bergeron, MD, have reviewed all documentation for this visit. The documentation on 10/26/20 for the exam, diagnosis, procedures, and orders are all accurate and complete. Signed, Freada Bergeron, MD  10/26/2020 10:07 AM    [place]

## 2020-10-30 ENCOUNTER — Ambulatory Visit
Admission: RE | Admit: 2020-10-30 | Discharge: 2020-10-30 | Disposition: A | Discharge status: Home / Self Care | Payer: Medicare PPO | Source: Ambulatory Visit | Attending: Podiatry | Admitting: Podiatry

## 2020-10-30 ENCOUNTER — Other Ambulatory Visit: Payer: Self-pay

## 2020-10-30 DIAGNOSIS — R600 Localized edema: Secondary | ICD-10-CM | POA: Diagnosis not present

## 2020-10-30 DIAGNOSIS — M7989 Other specified soft tissue disorders: Secondary | ICD-10-CM | POA: Diagnosis not present

## 2020-10-30 DIAGNOSIS — M65871 Other synovitis and tenosynovitis, right ankle and foot: Secondary | ICD-10-CM | POA: Diagnosis not present

## 2020-10-30 DIAGNOSIS — M778 Other enthesopathies, not elsewhere classified: Secondary | ICD-10-CM

## 2020-10-30 DIAGNOSIS — M25871 Other specified joint disorders, right ankle and foot: Secondary | ICD-10-CM

## 2020-11-01 ENCOUNTER — Other Ambulatory Visit: Payer: Self-pay | Admitting: Podiatry

## 2020-11-01 DIAGNOSIS — Z1231 Encounter for screening mammogram for malignant neoplasm of breast: Secondary | ICD-10-CM | POA: Diagnosis not present

## 2020-11-01 LAB — HM MAMMOGRAPHY

## 2020-11-11 ENCOUNTER — Ambulatory Visit (INDEPENDENT_AMBULATORY_CARE_PROVIDER_SITE_OTHER): Payer: Medicare PPO | Admitting: Pharmacist

## 2020-11-11 ENCOUNTER — Other Ambulatory Visit: Payer: Self-pay

## 2020-11-11 DIAGNOSIS — Z7189 Other specified counseling: Secondary | ICD-10-CM | POA: Insufficient documentation

## 2020-11-11 NOTE — Patient Instructions (Addendum)
It was nice to meet you today!  Your blood pressure goal is < 130/46mmHg  Your LDL cholesterol goal is < 100  Continue with your physical activity - your goal is 150 minutes per week  Limit your daily sodium intake to < 2,000mg . You can look for [name] salt substitute  Try to decrease your caffeine intake to 2 servings per day  Look for an Omron bicep normal size cuff to keep an eye on your blood pressure at home   Recheck fasting cholesterol on Monday, August 22nd any time after 7:30am

## 2020-11-11 NOTE — Progress Notes (Signed)
Patient ID: Cynthia Rhodes                 DOB: 1945/08/04                      MRN: [id number]     HPI: Cynthia Rhodes is a 75 y.o. female referred by Dr. Johney Frame to pharmacy clinic for HTN and hyperlipidemia management. 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 Presented to [facility] 04/2020 with left anterior chest pain 2 days after seeing her chiropractor, cardiac workup including trops, ECG and TTE were unremarkable. Echo 04/25/20 showed EF 65-70%. Thought to be noncardiac in nature since pain was reproducible with palpation. BP was very elevated at the time and she was started on amlodipine. Saw Dr Johney Frame 10/26/20 for follow up. Had stopped taking her amlodipine and atorvastatin because she wanted to pursue natural route instead of take medications. Goal to lose 20 more lbs. Referred to PharmD for lifestyle education to aid in BP and lipid management.  Pt presents today in good spirits. Started red yeast rice at last visit and is tolerating well. Did not have any prior side effects with amlodipine or atorvastatin, just prefers to avoid taking prescription medication. Has a Kinecto watch that monitors step count, HR, and BP. BP measurement does not seem to be overly accurate - 119/80 and 117/74 using this device compared to 138/84 then 130/80 using manual cuff in office. Diet overall healthy, does like adding salt to her food and has ~3 cups of caffeinated coffee + occasionally unsweet tea though.  LDL goal: 100mg /dL BP goal: <130/20mmHg  Family History: Asthma in her mother; Breast cancer in her mother.  Social History: Denies tobacco and drug use, occasional alcohol use  Diet:  Breakfast - oatmeal with cranberries, walnuts and oat milk and slice of seeded organic bread Lunch - smoothies with fruit, yogurt, oat milk, a bit of Stevia Dinner - 6-8oz meat, vegetable,  potato. Gets salad when she goes out to eat Snacks - unsalted nuts, popcorn Drinks - water, occasionally has an unsweet tea, 2-3 cups of coffee a day. Rarely drinks alcohol  Exercise: 2 dogs - walks 30 minutes twice daily, yard work, gets 10,000+ steps a day  Labs: 10/21/20: TC 227, TG 69, HDL 80, LDL 135 (no LLT)  Wt Readings from Last 3 Encounters:  10/26/20 162 lb 9.6 oz (73.8 kg)  08/10/20 157 lb 12.8 oz (71.6 kg)  04/27/20 155 lb 6.4 oz (70.5 kg)   BP Readings from Last 3 Encounters:  10/26/20 136/80  08/10/20 (!) 155/84  04/27/20 114/70   Pulse Readings from Last 3 Encounters:  10/26/20 71  08/10/20 69  04/27/20 93    Renal function: CrCl cannot be calculated (Patient's most recent lab result is older than the maximum 21 days allowed.).  Past Medical History:  Diagnosis Date   Cancer ([place]) 2021   History of hiatal hernia    Hypothyroidism    Peripheral vascular disease ([place])    varicose veins    Current Outpatient Medications on File Prior to Visit  Medication Sig Dispense Refill   acetaminophen (TYLENOL) 650 MG CR tablet Take 1,300 mg by mouth every 8 (eight) hours as needed for pain.     albuterol (VENTOLIN HFA) 108 (90 Base) MCG/ACT inhaler Inhale 2 puffs into the lungs every 6 (six) hours as needed for wheezing  or shortness of breath. 8 g 6   Calcium Carb-Cholecalciferol (CALCIUM + D3 PO) Take 1 tablet by mouth daily.      EPINEPHrine 0.3 mg/0.3 mL IJ SOAJ injection See admin instructions.     levothyroxine (SYNTHROID) 50 MCG tablet Take 50 mcg by mouth every other day. Alternating with 75 mcg     levothyroxine (SYNTHROID) 75 MCG tablet Take 75 mcg by mouth every other day. Alternating with 50 mcg     lidocaine (LIDODERM) 5 % Place 1 patch onto the skin daily as needed (pain). Remove & Discard patch within 12 hours or as directed by MD     magnesium oxide (MAG-OX) 400 MG tablet Take 400 mg by mouth daily.     Multiple Vitamins-Minerals (OCUVITE EYE HEALTH  FORMULA PO) Take 1 tablet by mouth daily.     omalizumab Arvid Right) 150 MG/ML prefilled syringe Inject 150 mg into the skin every 28 (twenty-eight) days.     OVER THE COUNTER MEDICATION Take 6 capsules by mouth daily. Balance of Nature Fruits & Veggies supplement (3 Veggies + 3 Fruit)     polyvinyl alcohol (LIQUIFILM TEARS) 1.4 % ophthalmic solution Place 1 drop into both eyes as needed for dry eyes.     Red Yeast Rice 600 MG TABS Take 1 tablet (600 mg total) by mouth daily. 90 tablet 1   No current facility-administered medications on file prior to visit.    Allergies  Allergen Reactions   Codeine Nausea Only and Other (See Comments)    Severe headaches    Sulfa Antibiotics Nausea And Vomiting   Voltaren [Diclofenac Sodium] Itching and Rash     Assessment/Plan:  1. Hypertension - BP right at goal <130/59mmHg on no medication therapy. Encouraged pt to purchase Omron bicep cuff to monitor her BP at home since her wrist watch does not seem to be giving her very accurate readings. Encouraged pt to continue with physical activity, to limit daily sodium to 000mg  daily with [name] as a salt substitute option, and to decrease caffeine intake to 2 cups per day.  2. Hyperlipidemia - Baseline LDL 135, goal 100mg /dL for primary prevention. Pt started red yeast rice 600mg  daily at last visit, does not wish to take prescription medications for her cholesterol. Discussed Mediterranean diet at visit today. Will recheck fasting lipids in 2 months.  Charline Hoskinson E. Damaso Laday, PharmD, BCACP, [place] [address], [place], [place] [address] Phone: [phone]; Fax: [phone] 11/11/2020 3:31 PM

## 2020-11-16 ENCOUNTER — Ambulatory Visit (INDEPENDENT_AMBULATORY_CARE_PROVIDER_SITE_OTHER): Payer: Medicare PPO | Admitting: Podiatry

## 2020-11-16 ENCOUNTER — Other Ambulatory Visit: Payer: Self-pay

## 2020-11-16 DIAGNOSIS — M25571 Pain in right ankle and joints of right foot: Secondary | ICD-10-CM

## 2020-11-16 NOTE — Progress Notes (Signed)
  Subjective:  Patient ID: Cynthia Rhodes, female    DOB: 1946/05/01,  MRN: [id number]  Chief Complaint  Patient presents with   Results    Review MRI results    Tendonitis    F/U Rt tendonitis -pt states," some better, pain now is 2/10 Constant pain." -worse with a lot of walking -no swelling Tx: icing -pt does not wear brace     75 y.o. female returns with the above complaint. History confirmed with patient.  Overall improved.  She has 2 out of 10 pain.  Most the pain is now on the side of the ankle and not in the front.  She completed the MRI.  Objective:  Physical Exam: warm, good capillary refill, no trophic changes or ulcerative lesions, normal DP and PT pulses and normal sensory exam.  Right Foot: Pain over the sinus tarsi  Radiographs: X-ray of right foot and ankle: no fracture, dislocation, swelling or degenerative changes noted, no fracture  Study Result  Narrative & Impression  CLINICAL DATA:  Ankle pain, impingement syndrome suspected, neg xray   EXAM: MRI OF THE RIGHT ANKLE WITHOUT CONTRAST   TECHNIQUE: Multiplanar, multisequence MR imaging of the ankle was performed. No intravenous contrast was administered.   COMPARISON:  Ankle radiograph 08/20/2020   FINDINGS: Susceptibility artifact noted in the first metatarsal from prior osteotomy and screw fixation.   TENDONS   Peroneal: Peroneal longus tendon intact. Peroneal brevis intact.   Posteromedial: Tenosynovitis of the posterior tibial tendon above and below the malleolus. Flexor digitorum longus tendon intact. Flexor hallucis longus tendon intact.   Anterior: Tibialis anterior tendon intact. Extensor hallucis longus tendon intact Extensor digitorum longus tendon intact.   Achilles:  Intact.   Plantar Fascia: Intact.   LIGAMENTS   Lateral: Anterior talofibular ligament intact. Calcaneofibular ligament intact. Posterior talofibular ligament intact. Anterior and posterior tibiofibular ligaments  intact.   Medial: Deltoid ligament intact. Spring ligament intact.   CARTILAGE   Ankle Joint: No joint effusion. Normal ankle mortise. No chondral defect.   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   Bones: No acute fracture. There is periarticular edema at the second-fourth tarsometatarsal joints consistent with arthritis.   Soft Tissue: Ankle soft tissue swelling.   IMPRESSION: Tenosynovitis of the posterior tibial tendon above and below the malleolus.   Mild second-fourth tarsometatarsal joint arthritis. Mild edema in the lateral talar process, likely due to subtalar arthritis.   Intact ankle ligaments.  Generalized ankle soft tissue swelling.     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  Reviewed MRI with patient.  Discussed treatment for this.  Overall she has had improvement.  Recommended corticosteroid injection of the right subtalar joint for symptomatic relief.  Following sterile prep with Betadine, half a cc each of 2% lidocaine and half percent Marcaine plain as well as 2 mg of dexamethasone phosphate and Kenalog 10 were injected into the right sinus tarsi.  She tolerated well and was dressed with a Band-Aid.  Return as needed for further injections  Return if symptoms worsen or fail to improve.

## 2020-11-17 DIAGNOSIS — L501 Idiopathic urticaria: Secondary | ICD-10-CM | POA: Diagnosis not present

## 2020-11-19 DIAGNOSIS — M7989 Other specified soft tissue disorders: Secondary | ICD-10-CM | POA: Diagnosis not present

## 2020-11-19 DIAGNOSIS — E663 Overweight: Secondary | ICD-10-CM | POA: Diagnosis not present

## 2020-11-19 DIAGNOSIS — Z6826 Body mass index (BMI) 26.0-26.9, adult: Secondary | ICD-10-CM | POA: Diagnosis not present

## 2020-11-19 DIAGNOSIS — M150 Primary generalized (osteo)arthritis: Secondary | ICD-10-CM | POA: Diagnosis not present

## 2020-12-15 DIAGNOSIS — L501 Idiopathic urticaria: Secondary | ICD-10-CM | POA: Diagnosis not present

## 2020-12-22 DIAGNOSIS — Z79899 Other long term (current) drug therapy: Secondary | ICD-10-CM | POA: Diagnosis not present

## 2020-12-22 DIAGNOSIS — M818 Other osteoporosis without current pathological fracture: Secondary | ICD-10-CM | POA: Diagnosis not present

## 2020-12-22 DIAGNOSIS — E039 Hypothyroidism, unspecified: Secondary | ICD-10-CM | POA: Diagnosis not present

## 2020-12-23 ENCOUNTER — Telehealth: Payer: Self-pay | Admitting: Medical Oncology

## 2020-12-23 NOTE — Telephone Encounter (Signed)
I instructed pt to call CS to make appt for CT scan 09/20.

## 2020-12-29 DIAGNOSIS — M5136 Other intervertebral disc degeneration, lumbar region: Secondary | ICD-10-CM | POA: Diagnosis not present

## 2020-12-29 DIAGNOSIS — M9903 Segmental and somatic dysfunction of lumbar region: Secondary | ICD-10-CM | POA: Diagnosis not present

## 2020-12-31 DIAGNOSIS — M810 Age-related osteoporosis without current pathological fracture: Secondary | ICD-10-CM | POA: Diagnosis not present

## 2021-01-03 DIAGNOSIS — M9903 Segmental and somatic dysfunction of lumbar region: Secondary | ICD-10-CM | POA: Diagnosis not present

## 2021-01-03 DIAGNOSIS — M5136 Other intervertebral disc degeneration, lumbar region: Secondary | ICD-10-CM | POA: Diagnosis not present

## 2021-01-05 DIAGNOSIS — M9903 Segmental and somatic dysfunction of lumbar region: Secondary | ICD-10-CM | POA: Diagnosis not present

## 2021-01-05 DIAGNOSIS — M5136 Other intervertebral disc degeneration, lumbar region: Secondary | ICD-10-CM | POA: Diagnosis not present

## 2021-01-10 ENCOUNTER — Other Ambulatory Visit: Payer: Medicare PPO | Admitting: *Deleted

## 2021-01-10 ENCOUNTER — Ambulatory Visit
Admission: RE | Admit: 2021-01-10 | Discharge: 2021-01-10 | Disposition: A | Discharge status: Home / Self Care | Payer: Medicare PPO | Source: Ambulatory Visit | Attending: Thoracic Surgery (Cardiothoracic Vascular Surgery) | Admitting: Thoracic Surgery (Cardiothoracic Vascular Surgery)

## 2021-01-10 ENCOUNTER — Other Ambulatory Visit: Payer: Self-pay | Admitting: Thoracic Surgery (Cardiothoracic Vascular Surgery)

## 2021-01-10 ENCOUNTER — Other Ambulatory Visit: Payer: Self-pay

## 2021-01-10 DIAGNOSIS — Z902 Acquired absence of lung [part of]: Secondary | ICD-10-CM

## 2021-01-10 DIAGNOSIS — D3A090 Benign carcinoid tumor of the bronchus and lung: Secondary | ICD-10-CM

## 2021-01-10 DIAGNOSIS — Z7189 Other specified counseling: Secondary | ICD-10-CM | POA: Diagnosis not present

## 2021-01-10 DIAGNOSIS — M9903 Segmental and somatic dysfunction of lumbar region: Secondary | ICD-10-CM | POA: Diagnosis not present

## 2021-01-10 DIAGNOSIS — Z9889 Other specified postprocedural states: Secondary | ICD-10-CM | POA: Diagnosis not present

## 2021-01-10 DIAGNOSIS — Z79899 Other long term (current) drug therapy: Secondary | ICD-10-CM | POA: Diagnosis not present

## 2021-01-10 DIAGNOSIS — I10 Essential (primary) hypertension: Secondary | ICD-10-CM | POA: Diagnosis not present

## 2021-01-10 DIAGNOSIS — R0789 Other chest pain: Secondary | ICD-10-CM | POA: Diagnosis not present

## 2021-01-10 DIAGNOSIS — E785 Hyperlipidemia, unspecified: Secondary | ICD-10-CM | POA: Diagnosis not present

## 2021-01-10 DIAGNOSIS — E7800 Pure hypercholesterolemia, unspecified: Secondary | ICD-10-CM | POA: Diagnosis not present

## 2021-01-10 DIAGNOSIS — M5136 Other intervertebral disc degeneration, lumbar region: Secondary | ICD-10-CM | POA: Diagnosis not present

## 2021-01-10 LAB — LIPID PANEL
Chol/HDL Ratio: 3 ratio (ref 0.0–4.4)
Cholesterol, Total: 208 mg/dL — ABNORMAL HIGH (ref 100–199)
HDL: 70 mg/dL (ref >39)
LDL Chol Calc (NIH): 119 mg/dL — ABNORMAL HIGH (ref 0–99)
Triglycerides: 106 mg/dL (ref 0–149)
VLDL Cholesterol Cal: 19 mg/dL (ref 5–40)

## 2021-01-11 ENCOUNTER — Telehealth: Payer: Self-pay

## 2021-01-11 ENCOUNTER — Encounter: Payer: Self-pay | Admitting: Thoracic Surgery (Cardiothoracic Vascular Surgery)

## 2021-01-11 ENCOUNTER — Ambulatory Visit (INDEPENDENT_AMBULATORY_CARE_PROVIDER_SITE_OTHER): Payer: Medicare PPO | Admitting: Thoracic Surgery (Cardiothoracic Vascular Surgery)

## 2021-01-11 VITALS — BP 140/87 | HR 80 | Resp 20 | Ht 66.0 in | Wt 160.0 lb

## 2021-01-11 DIAGNOSIS — D3A090 Benign carcinoid tumor of the bronchus and lung: Secondary | ICD-10-CM | POA: Diagnosis not present

## 2021-01-11 DIAGNOSIS — Z902 Acquired absence of lung [part of]: Secondary | ICD-10-CM

## 2021-01-11 DIAGNOSIS — Z7189 Other specified counseling: Secondary | ICD-10-CM

## 2021-01-11 DIAGNOSIS — E785 Hyperlipidemia, unspecified: Secondary | ICD-10-CM

## 2021-01-11 MED ORDER — ROSUVASTATIN CALCIUM 10 MG PO TABS: 10.0000 mg | ORAL_TABLET | Freq: Every day | ORAL | 11 refills | Status: DC

## 2021-01-11 NOTE — Telephone Encounter (Signed)
-----   Message from Leeroy Bock, [place] sent at 01/11/2021  7:35 AM EDT ----- Pt seen in lipid clinic in June. Only wished to take red yeast rice for her cholesterol, did not want to take any prescription medication for her cholesterol. She does not have any intolerances to lipid lowering therapies, just does not want to take them. Her LDL improved slightly on red yeast rice, her goal is < 100. If she is willing to take prescription medication, would recommend rosuvastatin 10mg  daily. If she still remains resistant, she was taking red yeast rice 600mg  daily at last visit and could increase to BID dosing.

## 2021-01-11 NOTE — Telephone Encounter (Signed)
Excellent. Recommend rechecking lipids/LFTs in about 3 months, PCP can also check these if she sees them sooner for follow up.

## 2021-01-11 NOTE — Progress Notes (Signed)
[address]       [place] [address]             [phone]     HPI: Ms. Lippert returns for a scheduled follow-up visit  Laree "[name]" Dawe is a 75 year old woman who was incidentally found to have a right middle lobe nodule last year.  CT of the chest showed a right middle lobe nodule and a small right upper lobe nodule.  The middle lobe nodule was hypermetabolic on PET/CT.  Biopsy showed a well differentiated carcinoid tumor.  I did a robotic right middle lobectomy and upper lobe wedge resection on 01/15/2020.  She did well postoperatively and went home on day 3.  Final pathology was T4, N0, stage IIIa typical carcinoid tumor.  She saw Dr. [name] and did not require adjuvant therapy.  She is currently under observation.  She is feeling well.  She does not have any pain related to her surgery.  She has not had any respiratory problems.  Past Medical History:  Diagnosis Date   Cancer ([place]) 2021   History of hiatal hernia    Hypothyroidism    Peripheral vascular disease ([facility])    varicose veins    Current Outpatient Medications  Medication Sig Dispense Refill   acetaminophen (TYLENOL) 650 MG CR tablet Take 1,300 mg by mouth every 8 (eight) hours as needed for pain.     albuterol (VENTOLIN HFA) 108 (90 Base) MCG/ACT inhaler Inhale 2 puffs into the lungs every 6 (six) hours as needed for wheezing or shortness of breath. 8 g 6   Calcium Carb-Cholecalciferol (CALCIUM + D3 PO) Take 1 tablet by mouth daily.      Cholecalciferol 125 MCG (5000 UT) TABS Take 1 tablet by mouth daily.     EPINEPHrine 0.3 mg/0.3 mL IJ SOAJ injection See admin instructions.     levothyroxine (SYNTHROID) 50 MCG tablet Take 50 mcg by mouth every other day. Alternating with 75 mcg     levothyroxine (SYNTHROID) 75 MCG tablet Take 75 mcg by mouth every other day. Alternating with 50 mcg     lidocaine (LIDODERM) 5 % Place 1 patch onto the skin daily as needed (pain). Remove & Discard  patch within 12 hours or as directed by MD     magnesium oxide (MAG-OX) 400 MG tablet Take 400 mg by mouth daily.     Multiple Vitamins-Minerals (OCUVITE EYE HEALTH FORMULA PO) Take 1 tablet by mouth daily.     omalizumab Arvid Right) 150 MG/ML prefilled syringe Inject 150 mg into the skin every 28 (twenty-eight) days.     OVER THE COUNTER MEDICATION Take 6 capsules by mouth daily. Balance of Nature Fruits & Veggies supplement (3 Veggies + 3 Fruit)     polyvinyl alcohol (LIQUIFILM TEARS) 1.4 % ophthalmic solution Place 1 drop into both eyes as needed for dry eyes.     rosuvastatin (CRESTOR) 10 MG tablet Take 1 tablet (10 mg total) by mouth daily. 30 tablet 11   No current facility-administered medications for this visit.    Physical Exam BP 140/87   Pulse 80   Resp 20   Ht 5\' 6"  (1.676 m)   Wt 160 lb (72.6 kg)   SpO2 100% Comment: RA  BMI 25.82 kg/m  75 year old woman in no acute distress Alert and oriented x3 with no focal deficits Lungs clear with equal breath sounds bilaterally Incisions well-healed Cardiac regular rate and rhythm  Diagnostic Tests: CHEST - 2  VIEW   COMPARISON:  04/24/2020 and chest CT 08/05/2020   FINDINGS: Stable postsurgical changes in the right chest. No airspace disease, consolidation or suspicious nodularity. Heart and mediastinum are within normal limits and stable. No pleural effusions. No acute bone abnormality.   IMPRESSION: Postsurgical changes without acute disease.     Electronically Signed   By: Markus Daft M.D.   On: 01/10/2021 15:27 I personally reviewed the chest x-ray images.  There is no evidence of recurrence.  Impression: Janiyla "[name]" Gibeault is a 75 year old woman who had a right middle lobectomy and upper lobe wedge resection for stage IIIa (T4, N0) carcinoid tumor a year ago.  She is now a year out from surgery and does not have any residual effects from that.  She sees Dr. [name] in September and will have a CT of the chest  at that time.  Plan: Follow-up as scheduled with Dr. [name] I will be happy to see Mrs. Mcgibbon back anytime in the future if I can be of any further assistance with her care  I spent over 15 minutes in review of records, images, and in consultation with Mrs. Laneve today. Melrose Nakayama, MD [facility] [phone]

## 2021-01-11 NOTE — Telephone Encounter (Signed)
Spoke with the patient and she is willing to try rosuvastatin 10 mg daily. She will discontinue red yeast rice.

## 2021-01-12 DIAGNOSIS — L501 Idiopathic urticaria: Secondary | ICD-10-CM | POA: Diagnosis not present

## 2021-01-12 DIAGNOSIS — M5136 Other intervertebral disc degeneration, lumbar region: Secondary | ICD-10-CM | POA: Diagnosis not present

## 2021-01-12 DIAGNOSIS — M9903 Segmental and somatic dysfunction of lumbar region: Secondary | ICD-10-CM | POA: Diagnosis not present

## 2021-01-17 DIAGNOSIS — M5136 Other intervertebral disc degeneration, lumbar region: Secondary | ICD-10-CM | POA: Diagnosis not present

## 2021-01-17 DIAGNOSIS — M9903 Segmental and somatic dysfunction of lumbar region: Secondary | ICD-10-CM | POA: Diagnosis not present

## 2021-01-20 DIAGNOSIS — M9903 Segmental and somatic dysfunction of lumbar region: Secondary | ICD-10-CM | POA: Diagnosis not present

## 2021-01-20 DIAGNOSIS — M5136 Other intervertebral disc degeneration, lumbar region: Secondary | ICD-10-CM | POA: Diagnosis not present

## 2021-01-25 ENCOUNTER — Encounter: Payer: Self-pay | Admitting: Emergency Medicine

## 2021-01-25 ENCOUNTER — Ambulatory Visit (INDEPENDENT_AMBULATORY_CARE_PROVIDER_SITE_OTHER): Payer: Medicare PPO | Admitting: Emergency Medicine

## 2021-01-25 ENCOUNTER — Other Ambulatory Visit: Payer: Self-pay

## 2021-01-25 DIAGNOSIS — R0602 Shortness of breath: Secondary | ICD-10-CM

## 2021-01-25 DIAGNOSIS — D3A090 Benign carcinoid tumor of the bronchus and lung: Secondary | ICD-10-CM

## 2021-01-25 DIAGNOSIS — L509 Urticaria, unspecified: Secondary | ICD-10-CM | POA: Diagnosis not present

## 2021-01-25 NOTE — Progress Notes (Signed)
Subjective:    Patient ID: Cynthia Rhodes, female    DOB: 1946/04/13, 75 y.o.   MRN: [id number]  HPI 75 year old never smoker with a history of hypothyroidism and allergic disease on Xolair, scheduled to have another injection in June, being managed Dr Curly Rim.  She is referred today for evaluation of a 3.3 lobulated anterior right middle lobe mass that was originally seen on virtual colonoscopy 10/02/2019.  This prompted a CT chest done on 10/06/2019 which I have reviewed, shows a 2.8 x 1.6 spiculated right middle lobe mass concerning for primary malignancy.  Also noted was a 7 x 5 mm nodule in the right upper lobe.  A PET scan has been ordered and is planned for 5/27.   ROV 11/10/19 --follow-up visit further evaluation of pulmonary nodular disease.  We performed bronchoscopy to better evaluate a 2.8 x 1.6 spiculated right middle lobe mass: No malignant cells were seen on cytology or pathology.   She feels well post-procedure.   ROV 02/12/20 --follow-up visit for 75 year old never smoker with hypothyroidism, allergies on Xolair.  She had a lobulated anterior right middle lobe mass that prompted bronchoscopy 11/04/2019 that was nondiagnostic.  We performed a TTNA and a needle core biopsy showed well-differentiated neuroendocrine tumor consistent with an atypical carcinoid.  She underwent surgical resection 01/15/2020.  Pathology from both right upper lobe wedge resection of the nodule and the right middle lobe lesion showed well differentiated carcinoid with clear margins and uninvolved lymph nodes. She has some exertional SOB, walks a total of a mile a day.   Tells me that she has new dx of bradycardia that was observed peri-operatively. She also has a brother with bradycardia (has a pacer).   Some cough, dry and hacking. Not every day.   She underwent pulmonary function testing on 12/30/2019 reviewed by me, shows grossly normal airflows with mild obstruction noted based on curve of her flow volume  loop.  No bronchodilator response, normal lung volumes and normal diffusion capacity.  ROV 01/25/21 --75 year old woman with a history of hypothyroidism and chronic allergies / hives on Xolair.  Also a right middle lobe carcinoid tumor that was surgically resected 12/2019.  Returns today for regular follow-up.  Grossly normal spirometry with possible curve to her flow-volume loop.  We did a trial of albuterol last year.  She reports that she is pretty active, does what she wants. She does feels some exertional SOB when walking dogs or in am when humidity. Minimal cough or wheeze. Uses albuterol about 2x a week. Not sure it helps her that much.  She is on xolair once a month, gets it at infusion center under obs. Reports no hives and controlled allergies.   CT chest 08/05/2020 reviewed by me shows stable findings post right lung resection.  Stable 3-4 mm bilateral pulmonary nodules compared with 04/24/2020. Planning for repeat this month.    Review of Systems As per HPI     Objective:   Physical Exam Vitals:   01/25/21 0905  BP: 128/70  Pulse: 65  Temp: 97.8 F (36.6 C)  TempSrc: Oral  SpO2: 99%  Weight: 162 lb (73.5 kg)  Height: 5\' 6"  (1.676 m)   Gen: Pleasant, well-nourished, in no distress,  normal affect  ENT: No lesions,  mouth clear,  oropharynx clear, no postnasal drip  Neck: No JVD, no stridor  Lungs: No use of accessory muscles, no crackles or wheezing on normal respiration, no wheeze on forced expiration  Cardiovascular: RRR, heart sounds  normal, no murmur or gallops, no peripheral edema  Musculoskeletal: No deformities, no cyanosis or clubbing  Neuro: alert, awake, non focal  Skin: Warm, no lesions or rash       Assessment & Plan:  Carcinoid tumor of right lung Being followed by Drs. Hendrickson and [place], stable postop imaging.  Next CT scan planned for this month.  Small stable pulmonary nodules to be followed.  Dyspnea In the absence of any significant  obstruction on pulmonary function testing, question some mild curve of her flow-volume loop.  She has albuterol available, does not change her breathing very much.  She has good functional capacity.  No plans to change regimen at this time  Hives With severe allergies.  Managed on Xolair once monthly.  She gets [facility] at [facility], ordered by another provider.  Baltazar Apo, MD, PhD 01/25/2021, 9:39 AM [facility] [phone] or if no answer [phone]

## 2021-01-25 NOTE — Patient Instructions (Signed)
Keep albuterol available to use 2 puffs if needed for shortness of breath, chest tightness, wheezing. Will not start any scheduled inhaled medication at this time. Continue to follow with Drs. Mohamed and [place] and get your surveillance CT scans of the chest as planned Continue your Xolair once monthly. Follow with Dr. Lamonte Sakai in 12 months or sooner if you have any problems.

## 2021-01-25 NOTE — Assessment & Plan Note (Signed)
Being followed by Drs. Hendrickson and [place], stable postop imaging.  Next CT scan planned for this month.  Small stable pulmonary nodules to be followed.

## 2021-01-25 NOTE — Assessment & Plan Note (Signed)
With severe allergies.  Managed on Xolair once monthly.  She gets these at [facility], ordered by another provider.

## 2021-01-25 NOTE — Assessment & Plan Note (Signed)
In the absence of any significant obstruction on pulmonary function testing, question some mild curve of her flow-volume loop.  She has albuterol available, does not change her breathing very much.  She has good functional capacity.  No plans to change regimen at this time

## 2021-02-01 DIAGNOSIS — M5136 Other intervertebral disc degeneration, lumbar region: Secondary | ICD-10-CM | POA: Diagnosis not present

## 2021-02-01 DIAGNOSIS — M9903 Segmental and somatic dysfunction of lumbar region: Secondary | ICD-10-CM | POA: Diagnosis not present

## 2021-02-08 ENCOUNTER — Encounter (HOSPITAL_COMMUNITY): Payer: Self-pay

## 2021-02-08 ENCOUNTER — Other Ambulatory Visit: Payer: Self-pay

## 2021-02-08 ENCOUNTER — Ambulatory Visit (HOSPITAL_COMMUNITY)
Admission: RE | Admit: 2021-02-08 | Discharge: 2021-02-08 | Disposition: A | Discharge status: Home / Self Care | Payer: Medicare PPO | Source: Ambulatory Visit | Attending: Internal Medicine | Admitting: Internal Medicine

## 2021-02-08 ENCOUNTER — Inpatient Hospital Stay: Payer: Medicare PPO | Attending: Internal Medicine

## 2021-02-08 DIAGNOSIS — C3490 Malignant neoplasm of unspecified part of unspecified bronchus or lung: Secondary | ICD-10-CM | POA: Insufficient documentation

## 2021-02-08 DIAGNOSIS — K449 Diaphragmatic hernia without obstruction or gangrene: Secondary | ICD-10-CM | POA: Insufficient documentation

## 2021-02-08 DIAGNOSIS — C3482 Malignant neoplasm of overlapping sites of left bronchus and lung: Secondary | ICD-10-CM | POA: Diagnosis not present

## 2021-02-08 DIAGNOSIS — I700 Atherosclerosis of aorta: Secondary | ICD-10-CM | POA: Diagnosis not present

## 2021-02-08 DIAGNOSIS — I739 Peripheral vascular disease, unspecified: Secondary | ICD-10-CM | POA: Insufficient documentation

## 2021-02-08 DIAGNOSIS — E039 Hypothyroidism, unspecified: Secondary | ICD-10-CM | POA: Diagnosis not present

## 2021-02-08 DIAGNOSIS — Z79899 Other long term (current) drug therapy: Secondary | ICD-10-CM | POA: Diagnosis not present

## 2021-02-08 DIAGNOSIS — R911 Solitary pulmonary nodule: Secondary | ICD-10-CM | POA: Diagnosis not present

## 2021-02-08 DIAGNOSIS — C3481 Malignant neoplasm of overlapping sites of right bronchus and lung: Secondary | ICD-10-CM | POA: Diagnosis present

## 2021-02-08 DIAGNOSIS — R918 Other nonspecific abnormal finding of lung field: Secondary | ICD-10-CM | POA: Diagnosis not present

## 2021-02-08 LAB — CMP (CANCER CENTER ONLY)
ALT: 37 U/L (ref 0–44)
AST: 43 U/L — ABNORMAL HIGH (ref 15–41)
Albumin: 4 g/dL (ref 3.5–5.0)
Alkaline Phosphatase: 107 U/L (ref 38–126)
Anion gap: 8 (ref 5–15)
BUN: 23 mg/dL (ref 8–23)
CO2: 26 mmol/L (ref 22–32)
Calcium: 9.3 mg/dL (ref 8.9–10.3)
Chloride: 107 mmol/L (ref 98–111)
Creatinine: 0.89 mg/dL (ref 0.44–1.00)
GFR, Estimated: >60 mL/min (ref >60)
Glucose, Bld: 100 mg/dL — ABNORMAL HIGH (ref 70–99)
Potassium: 4.9 mmol/L (ref 3.5–5.1)
Sodium: 141 mmol/L (ref 135–145)
Total Bilirubin: 0.5 mg/dL (ref 0.3–1.2)
Total Protein: 7 g/dL (ref 6.5–8.1)

## 2021-02-08 LAB — CBC WITH DIFFERENTIAL (CANCER CENTER ONLY)
Abs Immature Granulocytes: 0.02 10*3/uL (ref 0.00–0.07)
Basophils Absolute: 0.1 10*3/uL (ref 0.0–0.1)
Basophils Relative: 1 %
Eosinophils Absolute: 0.3 10*3/uL (ref 0.0–0.5)
Eosinophils Relative: 5 %
HCT: 39.8 % (ref 36.0–46.0)
Hemoglobin: 13.2 g/dL (ref 12.0–15.0)
Immature Granulocytes: 0 %
Lymphocytes Relative: 25 %
Lymphs Abs: 1.6 10*3/uL (ref 0.7–4.0)
MCH: 30.1 pg (ref 26.0–34.0)
MCHC: 33.2 g/dL (ref 30.0–36.0)
MCV: 90.7 fL (ref 80.0–100.0)
Monocytes Absolute: 0.9 10*3/uL (ref 0.1–1.0)
Monocytes Relative: 14 %
Neutro Abs: 3.6 10*3/uL (ref 1.7–7.7)
Neutrophils Relative %: 55 %
Platelet Count: 344 10*3/uL (ref 150–400)
RBC: 4.39 MIL/uL (ref 3.87–5.11)
RDW: 12.6 % (ref 11.5–15.5)
WBC Count: 6.5 10*3/uL (ref 4.0–10.5)
nRBC: 0 % (ref 0.0–0.2)

## 2021-02-08 MED ORDER — IOHEXOL 350 MG/ML SOLN
60.0000 mL | Freq: Once | INTRAVENOUS | Status: AC | PRN
  Administered 2021-02-08: 60 mL via INTRAVENOUS

## 2021-02-09 ENCOUNTER — Inpatient Hospital Stay (HOSPITAL_BASED_OUTPATIENT_CLINIC_OR_DEPARTMENT_OTHER): Payer: Medicare PPO | Admitting: Internal Medicine

## 2021-02-09 ENCOUNTER — Telehealth: Payer: Self-pay | Admitting: Internal Medicine

## 2021-02-09 VITALS — BP 129/73 | HR 81 | Temp 97.6°F | Resp 16 | Ht 66.0 in | Wt 162.3 lb

## 2021-02-09 DIAGNOSIS — C3482 Malignant neoplasm of overlapping sites of left bronchus and lung: Secondary | ICD-10-CM | POA: Diagnosis not present

## 2021-02-09 DIAGNOSIS — E039 Hypothyroidism, unspecified: Secondary | ICD-10-CM | POA: Diagnosis not present

## 2021-02-09 DIAGNOSIS — C3490 Malignant neoplasm of unspecified part of unspecified bronchus or lung: Secondary | ICD-10-CM

## 2021-02-09 DIAGNOSIS — K449 Diaphragmatic hernia without obstruction or gangrene: Secondary | ICD-10-CM | POA: Diagnosis not present

## 2021-02-09 DIAGNOSIS — I739 Peripheral vascular disease, unspecified: Secondary | ICD-10-CM | POA: Diagnosis not present

## 2021-02-09 DIAGNOSIS — Z79899 Other long term (current) drug therapy: Secondary | ICD-10-CM | POA: Diagnosis not present

## 2021-02-09 DIAGNOSIS — I700 Atherosclerosis of aorta: Secondary | ICD-10-CM | POA: Diagnosis not present

## 2021-02-09 NOTE — Progress Notes (Signed)
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  PRIOR THERAPY: Status post right middle lobectomy with right upper lobe wedge resection and lymph node dissection under the care of Dr. Roxan Hockey on 01/15/2020.  CURRENT THERAPY: Observation.   INTERVAL HISTORY: Cynthia Rhodes 75 y.o. female returns to the clinic today for 10-month follow-up visit.  The patient is feeling fine today with no concerning complaints.  She denied having any current chest pain, shortness of breath, cough or hemoptysis.  She denied having any fever or chills.  She has no nausea, vomiting, diarrhea or constipation.  She has no headache or visual changes.  She had repeat CT scan of the chest and she is here today for evaluation and discussion of her risk her results.  MEDICAL HISTORY: Past Medical History:  Diagnosis Date   Cancer ([place]) 2021   History of hiatal hernia    Hypothyroidism    Peripheral vascular disease ([place])    varicose veins    ALLERGIES:  is allergic to codeine, sulfa antibiotics, sulfacetamide sodium-sulfur, and voltaren [diclofenac sodium].  MEDICATIONS:  Current Outpatient Medications  Medication Sig Dispense Refill   acetaminophen (TYLENOL) 650 MG CR tablet Take 1,300 mg by mouth every 8 (eight) hours as needed for pain.     albuterol (VENTOLIN HFA) 108 (90 Base) MCG/ACT inhaler Inhale 2 puffs into the lungs every 6 (six) hours as needed for wheezing or shortness of breath. 8 g 6   B Complex Vitamins (B COMPLEX VITAMIN PO) Take 1 tablet by mouth daily.     Calcium Carb-Cholecalciferol (CALCIUM + D3 PO) Take 1 tablet by mouth daily.      Cholecalciferol 125 MCG (5000 UT) TABS Take 1 tablet by mouth  daily.     EPINEPHrine 0.3 mg/0.3 mL IJ SOAJ injection See admin instructions.     levothyroxine (SYNTHROID) 50 MCG tablet Take 50 mcg by mouth every other day. Alternating with 75 mcg     levothyroxine (SYNTHROID) 75 MCG tablet Take 75 mcg by mouth every other day. Alternating with 50 mcg     lidocaine (LIDODERM) 5 % Place 1 patch onto the skin daily as needed (pain). Remove & Discard patch within 12 hours or as directed by MD     magnesium oxide (MAG-OX) 400 MG tablet Take 400 mg by mouth daily.     Multiple Vitamins-Minerals (OCUVITE EYE HEALTH FORMULA PO) Take 1 tablet by mouth daily.     omalizumab Arvid Right) 150 MG/ML prefilled syringe Inject 150 mg into the skin every 28 (twenty-eight) days.     OVER THE COUNTER MEDICATION Take 6 capsules by mouth daily. Balance of Nature Fruits & Veggies supplement (3 Veggies + 3 Fruit)     polyvinyl alcohol (LIQUIFILM TEARS) 1.4 % ophthalmic solution Place 1 drop into both eyes as needed for dry eyes.     rosuvastatin (CRESTOR) 10 MG tablet Take 1 tablet (10 mg total) by mouth daily. 30 tablet 11   No current facility-administered medications for this visit.    SURGICAL HISTORY:  Past Surgical History:  Procedure Laterality Date   ABDOMINAL HYSTERECTOMY     APPENDECTOMY  1954   BRONCHIAL BIOPSY  11/04/2019   Procedure:  BRONCHIAL BIOPSIES;  Surgeon: Collene Gobble, MD;  Location: [facility] ENDOSCOPY;  Service: Pulmonary;;   BRONCHIAL BRUSHINGS  11/04/2019   Procedure: BRONCHIAL BRUSHINGS;  Surgeon: Collene Gobble, MD;  Location: [facility] ENDOSCOPY;  Service: Pulmonary;;   BRONCHIAL NEEDLE ASPIRATION BIOPSY  11/04/2019   Procedure: BRONCHIAL NEEDLE ASPIRATION BIOPSIES;  Surgeon: Collene Gobble, MD;  Location: [facility] ENDOSCOPY;  Service: Pulmonary;;   BRONCHIAL WASHINGS  11/04/2019   Procedure: BRONCHIAL WASHINGS;  Surgeon: Collene Gobble, MD;  Location: [facility] ENDOSCOPY;  Service: Pulmonary;;   BUNIONECTOMY Bilateral    [place] Bilateral 2021   INTERCOSTAL NERVE BLOCK Right 01/15/2020   Procedure: INTERCOSTAL NERVE BLOCK;  Surgeon: Melrose Nakayama, MD;  Location: [place];  Service: Thoracic;  Laterality: Right;   JOINT REPLACEMENT Bilateral    Knee   KNEE SURGERY Bilateral    LYMPH NODE DISSECTION N/A 01/15/2020   Procedure: LYMPH NODE DISSECTION;  Surgeon: Melrose Nakayama, MD;  Location: [place];  Service: Thoracic;  Laterality: N/A;   REPLACEMENT TOTAL KNEE BILATERAL     SIGMOIDECTOMY     TONSILLECTOMY     VIDEO BRONCHOSCOPY WITH ENDOBRONCHIAL NAVIGATION N/A 11/04/2019   Procedure: VIDEO BRONCHOSCOPY WITH ENDOBRONCHIAL NAVIGATION;  Surgeon: Collene Gobble, MD;  Location: [facility] ENDOSCOPY;  Service: Pulmonary;  Laterality: N/A;   VIDEO BRONCHOSCOPY WITH ENDOBRONCHIAL NAVIGATION N/A 01/15/2020   Procedure: VIDEO BRONCHOSCOPY WITH ENDOBRONCHIAL NAVIGATION to mark right upper lobe nodule;  Surgeon: Melrose Nakayama, MD;  Location: [facility] OR;  Service: Thoracic;  Laterality: N/A;    REVIEW OF SYSTEMS:  A comprehensive review of systems was negative.   PHYSICAL EXAMINATION: General appearance: alert, cooperative, and no distress Head: Normocephalic, without obvious abnormality, atraumatic Neck: no adenopathy, no JVD, supple, symmetrical, trachea midline, and thyroid not enlarged, symmetric, no tenderness/mass/nodules Lymph nodes: Cervical, supraclavicular, and axillary nodes normal. Resp: clear to auscultation bilaterally Back: symmetric, no curvature. ROM normal. No CVA tenderness. Cardio: regular rate and rhythm, S1, S2 normal, no murmur, click, rub or gallop GI: soft, non-tender; bowel sounds normal; no masses,  no organomegaly Extremities: extremities normal, atraumatic, no cyanosis or edema  ECOG PERFORMANCE STATUS: 1 - Symptomatic but completely ambulatory  Blood pressure 129/73, pulse 81, temperature 97.6 F (36.4 C), temperature source Tympanic, resp. rate 16, height 5\' 6"  (1.676 m), weight 162  lb 4.8 oz (73.6 kg), SpO2 99 %.  LABORATORY DATA: Lab Results  Component Value Date   WBC 6.5 02/08/2021   HGB 13.2 02/08/2021   HCT 39.8 02/08/2021   MCV 90.7 02/08/2021   PLT 344 02/08/2021      Chemistry      Component Value Date/Time   NA 141 02/08/2021 0924   K 4.9 02/08/2021 0924   CL 107 02/08/2021 0924   CO2 26 02/08/2021 0924   BUN 23 02/08/2021 0924   CREATININE 0.89 02/08/2021 0924      Component Value Date/Time   CALCIUM 9.3 02/08/2021 0924   ALKPHOS 107 02/08/2021 0924   AST 43 (H) 02/08/2021 0924   ALT 37 02/08/2021 0924   BILITOT 0.5 02/08/2021 0924       RADIOGRAPHIC STUDIES: CT Chest W Contrast  Result Date: 02/09/2021 CLINICAL DATA:  Follow-up right lung cancer status post right middle lobectomy, right upper lobe wedge resection EXAM: CT CHEST WITH CONTRAST TECHNIQUE: Multidetector CT imaging of the chest was performed during intravenous contrast administration. CONTRAST:  32mL OMNIPAQUE  IOHEXOL 350 MG/ML SOLN COMPARISON:  CT chest, 08/05/2020, PET-CT, 03/31/2020 FINDINGS: Cardiovascular: Aortic atherosclerosis. Mild cardiomegaly. No pericardial effusion. Mediastinum/Nodes: No enlarged mediastinal, hilar, or axillary lymph nodes. Thyroid gland, trachea, and esophagus demonstrate no significant findings. Lungs/Pleura: Unchanged postoperative findings of right middle lobectomy and wedge resection of the paramedian right upper lobe. There is unchanged soft tissue about the wedge resection margin, previously without FDG avidity (e.g. Series 5, image 50). Occasional small pulmonary nodules are stable, measuring no greater than 3 mm, for example in the peripheral right upper lobe (series 5, image 45) and in the medial right lower lobe (series 5, image 94). No pleural effusion or pneumothorax. Upper Abdomen: No acute abnormality. Musculoskeletal: No chest wall mass or suspicious bone lesions identified. IMPRESSION: 1. Unchanged postoperative findings of right middle  lobectomy and wedge resection of the paramedian right upper lobe. There is unchanged soft tissue about the wedge resection margin, previously without FDG avidity, consistent with postoperative scarring. Attention on follow-up. 2. No evidence of recurrent or metastatic disease in the chest. 3. Occasional small pulmonary nodules are stable, measuring no greater than 3 mm, almost certainly incidental and benign sequelae of infection or inflammation. Aortic Atherosclerosis (ICD10-I70.0). Electronically Signed   By: Eddie Candle M.D.   On: 02/09/2021 09:10     ASSESSMENT AND PLAN: This is a very pleasant 75 years old white female recently diagnosed with a stage IIIA (T4, N0, M0) well-differentiated neuroendocrine tumor, carcinoid tumor with multifocal disease in the right upper lobe as well as the right middle lobe status post right middle lobectomy and wedge resection of the right upper lobe with lymph node dissection under the care of Dr. Roxan Hockey on 01/15/2020. The patient has been in observation since that time and she is feeling fine today with no concerning complaints. She had repeat CT scan of the chest performed recently.  I personally and independently reviewed the scan and discussed the results with the patient today. Her scan showed no concerning findings for disease recurrence or metastasis. I recommended for her to continue on observation with repeat CT scan of the chest in 6 months. She was advised to call immediately if she has any other concerning symptoms in the interval. The patient voices understanding of current disease status and treatment options and is in agreement with the current care plan.  All questions were answered. The patient knows to call the clinic with any problems, questions or concerns. We can certainly see the patient much sooner if necessary.  Disclaimer: This note was dictated with voice recognition software. Similar sounding words can inadvertently be transcribed and  may not be corrected upon review.

## 2021-02-09 NOTE — Telephone Encounter (Signed)
Scheduled appt per 9/21 los - mailed letter with appt date and time

## 2021-02-10 DIAGNOSIS — L501 Idiopathic urticaria: Secondary | ICD-10-CM | POA: Diagnosis not present

## 2021-02-14 ENCOUNTER — Telehealth: Payer: Self-pay | Admitting: Cardiology

## 2021-02-14 MED ORDER — PRAVASTATIN SODIUM 20 MG PO TABS: 20.0000 mg | ORAL_TABLET | Freq: Every evening | ORAL | 1 refills | Status: DC

## 2021-02-14 NOTE — Telephone Encounter (Signed)
Rosuvastatin 10mg  daily is the first statin that pt has tried (previously preferred to take only OTC meds). Would advise she stay off rosuvastatin, her symptoms should improve within the next week or so if they're statin-induced. Once her symptoms resolve, recommend trying low dose pravastatin 20mg  daily to see if she tolerates it better.

## 2021-02-14 NOTE — Telephone Encounter (Signed)
Pt made aware of medication recommendations per [facility].  Pt will remain off of statins for a week, see how she feels, and at that time give new statin pravastatin 20 mg po daily a try to see how she tolerates.  Pt request only a 30 day supply of pravastatin be sent to her confirmed pharmacy of choice, to see how she tolerates this new regimen.  Pravastatin 20 mg po daily sent to the pts pharmacy of choice, with only a 30 day supply.  Pt will call for further refills if tolerated appropriately. Update crestor in the pts allergies as an intolerance.  Pt verbalized understanding and agrees with this plan.

## 2021-02-14 NOTE — Telephone Encounter (Signed)
Pt c/o medication issue:  1. Name of Medication:  rosuvastatin (CRESTOR) 10 MG tablet  2. How are you currently taking this medication (dosage and times per day)? 1 tablet by mouth daily prior, stopped taking yesterday.   3. Are you having a reaction (difficulty breathing--STAT)? Yes   4. What is your medication issue? Severe leg cramps at night and aching all over to the point she does not want to move during the day . Started occurring about a week ago.

## 2021-02-14 NOTE — Telephone Encounter (Signed)
Pt is calling back to give [facility] some information about side effects she is experiencing since starting Crestor on 8/23. Pt states since she started this regimen, she has had bilateral leg pains, leg cramps at night, and itching.  Pt states she could not tolerate this anymore, so she held her dose of this medication last night, and noted mild relief of symptoms.  Pt is inquiring if there is an alternative regimen Megan suggest in place of her crestor.  Advised the pt to continue to hold this medication.  Informed her that I will route this communication to [facility] to further review and advise on.  Informed the pt that I will follow-up with her accordingly thereafter.  Pt verbalized understanding and agrees with this plan.

## 2021-02-15 DIAGNOSIS — M5136 Other intervertebral disc degeneration, lumbar region: Secondary | ICD-10-CM | POA: Diagnosis not present

## 2021-02-15 DIAGNOSIS — M9903 Segmental and somatic dysfunction of lumbar region: Secondary | ICD-10-CM | POA: Diagnosis not present

## 2021-02-22 DIAGNOSIS — M9903 Segmental and somatic dysfunction of lumbar region: Secondary | ICD-10-CM | POA: Diagnosis not present

## 2021-02-22 DIAGNOSIS — M5136 Other intervertebral disc degeneration, lumbar region: Secondary | ICD-10-CM | POA: Diagnosis not present

## 2021-02-23 DIAGNOSIS — R030 Elevated blood-pressure reading, without diagnosis of hypertension: Secondary | ICD-10-CM | POA: Diagnosis not present

## 2021-02-23 DIAGNOSIS — E7800 Pure hypercholesterolemia, unspecified: Secondary | ICD-10-CM | POA: Diagnosis not present

## 2021-02-23 DIAGNOSIS — Z23 Encounter for immunization: Secondary | ICD-10-CM | POA: Diagnosis not present

## 2021-02-23 DIAGNOSIS — Z85118 Personal history of other malignant neoplasm of bronchus and lung: Secondary | ICD-10-CM | POA: Diagnosis not present

## 2021-02-23 DIAGNOSIS — F418 Other specified anxiety disorders: Secondary | ICD-10-CM | POA: Diagnosis not present

## 2021-03-01 DIAGNOSIS — M9903 Segmental and somatic dysfunction of lumbar region: Secondary | ICD-10-CM | POA: Diagnosis not present

## 2021-03-01 DIAGNOSIS — M5136 Other intervertebral disc degeneration, lumbar region: Secondary | ICD-10-CM | POA: Diagnosis not present

## 2021-03-08 DIAGNOSIS — M5136 Other intervertebral disc degeneration, lumbar region: Secondary | ICD-10-CM | POA: Diagnosis not present

## 2021-03-08 DIAGNOSIS — M9903 Segmental and somatic dysfunction of lumbar region: Secondary | ICD-10-CM | POA: Diagnosis not present

## 2021-03-23 ENCOUNTER — Telehealth: Payer: Self-pay | Admitting: Cardiology

## 2021-03-23 NOTE — Telephone Encounter (Signed)
I agree with having patient check her BP in the AM and PM. Please also ask her to take 2 readings 1 min apart both in the AM and PM. Make sure she is resting 5 min. Please also make sure she bought a new cuff as her apt with [place] her cuff was inaccurate. If she did not, please have her get a new upper arm cuff. Please have her do this and schedule her an apt in HTN clinic in about 1-2 weeks.

## 2021-03-23 NOTE — Telephone Encounter (Signed)
  Pt c/o BP issue: STAT if pt c/o blurred vision, one-sided weakness or slurred speech  1. What are your last 5 BP readings? Is is driving in the car but are all over 170's over 80's  2. Are you having any other symptoms (ex. Dizziness, headache, blurred vision, passed out)? No   3. What is your BP issue? Concerned that her BP is high   Patient called concerning her statin. She didn't go into further detail about that.

## 2021-03-23 NOTE — Telephone Encounter (Signed)
Pt is calling in for 2 reasons.  Pt states that since switching to pravastatin from rosuvastatin on 9/26, she is doing much better with this change and has had zero side effects with pravastatin.  She will continue with this regimen.   Pt does have complaints about elevated BP readings and headaches at night time, for a month now.  Pt states she saw her PCP for this, and they advised her elevated readings were due to anxiety. Pt states she was then started on zoloft for this, but numbers are still elevated.   Pt reports she monitors her pressures in the evening times only, and after dinner.  She has not monitored this in the morning time at all.  Pt states over the last month when she monitors her pressures, her numbers run in the high 161W-960A systolic and 54U diastolic.  She states her rates stay consistently in the 60s.  Pt states sometimes in the evenings she has a headache.  She denies any other cardiac complaints like chest pain, sob, doe, palpitations, dizziness, N/V, weakness, blurred vision, pre-syncopal or syncopal episodes.  Pt states she has never had BP issues in the past and is not on any BP medications at this time.  Pt states she tries to be mindful of her sodium intake and maintaining a low sodium diet.  Pt would like further advisement on this matter.   Advised the pt to maintain a low sodium diet.  Advised her to start monitoring and tracking her numbers in the AM and PM.  Pt education provided on how to correctly obtain her readings.   Informed the pt that I will route this message to our PharmD team and Dr. Jacolyn Reedy in basket for covering to further review and advise on.  Informed the pt that we will call her thereafter with their recommendations. Pt verbalized understanding and agrees with this plan.

## 2021-03-23 NOTE — Telephone Encounter (Signed)
Attempted to call the pt back and still no answer.  Will forward to ch st triage pool, incase pt calls back tomorrow.

## 2021-03-23 NOTE — Telephone Encounter (Signed)
Left the pt a message to call the office back to endorse recommendations per [facility].

## 2021-03-25 DIAGNOSIS — L501 Idiopathic urticaria: Secondary | ICD-10-CM | POA: Diagnosis not present

## 2021-03-25 NOTE — Telephone Encounter (Signed)
Pt aware of recommendations per Marcelle Overlie RPH in BP clinic.  Pt is scheduled to come in and see BP clinic here in the office on 11/18 at 1:30 pm.  She is aware to arrive 15 mins prior to this appt.  Pt will bring in her BP/HR recordings, as advised in this message by [facility], to her upcoming appt with them on 11/18. Pt also adds her cuff is brand new.  She is reminded of how to correctly take her pressures and of good placement on her arm to place the cuff.  Pt education provided on how frequently she should be taking her pressures, as advised in this message.  She is aware to be resting around 5 mins before obtaining and to take 2 readings 1 min apart both in the AM and PM, and write those down and bring into her appt on 11/18. Pt verbalized understanding and agrees with this plan.

## 2021-04-06 DIAGNOSIS — I10 Essential (primary) hypertension: Secondary | ICD-10-CM | POA: Diagnosis not present

## 2021-04-06 DIAGNOSIS — F411 Generalized anxiety disorder: Secondary | ICD-10-CM | POA: Diagnosis not present

## 2021-04-08 ENCOUNTER — Other Ambulatory Visit: Payer: Self-pay

## 2021-04-08 ENCOUNTER — Ambulatory Visit (INDEPENDENT_AMBULATORY_CARE_PROVIDER_SITE_OTHER): Payer: Medicare PPO | Admitting: Pharmacist

## 2021-04-08 DIAGNOSIS — E785 Hyperlipidemia, unspecified: Secondary | ICD-10-CM

## 2021-04-08 DIAGNOSIS — I10 Essential (primary) hypertension: Secondary | ICD-10-CM | POA: Insufficient documentation

## 2021-04-08 MED ORDER — AMLODIPINE BESYLATE 5 MG PO TABS: 5.0000 mg | ORAL_TABLET | Freq: Every day | ORAL | 11 refills | Status: DC

## 2021-04-08 MED ORDER — EZETIMIBE 10 MG PO TABS: 10.0000 mg | ORAL_TABLET | Freq: Every day | ORAL | 11 refills | Status: DC

## 2021-04-08 NOTE — Progress Notes (Signed)
Patient ID: Cynthia Rhodes                 DOB: Jan 04, 1946                      MRN: [id number]     HPI: Cynthia Rhodes is a pleasant 75 y.o. female referred by Dr. Johney Frame to pharmacy clinic for HTN and hyperlipidemia management. 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 Presented to [facility] 04/2020 with left anterior chest pain 2 days after seeing her chiropractor, cardiac workup including trops, ECG and TTE were unremarkable. Echo 04/25/20 showed EF 65-70%. Thought to be noncardiac in nature since pain was reproducible with palpation. BP was very elevated at the time and she was started on amlodipine. Saw Dr Johney Frame 10/26/20 for follow up. Had stopped taking her amlodipine and atorvastatin because she wanted to pursue natural route instead of take medications. Goal to lose 20 more lbs. Referred to PharmD for lifestyle education to aid in BP and lipid management.   I saw pt in clinic in June, pt was taking red yeast rice at that time and declined prescription medication for her cholesterol, blood pressure was well controlled. Follow up lipids showed LDL above goal and she was agreeable to starting rosuvastatin 10mg  daily on 8/23. She called clinic 9/26 with reports of severe leg cramps and itching. She was changed to pravastatin 20mg  daily. Called clinic 11/2 with concerns of elevated BP in the 170s/80 accompanied by headaches for a month. She was started on Zoloft by PCP for anxiety, but BP readings remained elevated.  Pt presents today for follow up. Recently purchased a bicep BP cuff 2 months ago. She previously brought it into her PCP office and was advised it was measuring accurately. Brings in a detailed log of home BP readings today. Has been checking BP in her left arm twice daily, 1 minute apart. Readings have improved since she started on Zoloft, now down to the  [id number] systolic primarily. Sleeping better since starting on Zoloft too. Previously experienced a headache when her systolic BP would increase into the 170s but this hasn't' happened in a while. Ordered a DASH diet recipe book. Has stopped eating higher sodium foods like deli meat and bacon. 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 Now experiencing joint pain on pravastatin as well.  LDL goal: 100mg /dL BP goal: <130/19mmHg   Family History: Asthma in her mother; Breast cancer in her mother.   Social History: Denies tobacco and drug use, occasional alcohol use   Diet:  Breakfast - oatmeal with cranberries, walnuts and oat milk and slice of seeded organic bread Lunch - smoothies with fruit, yogurt, oat milk, a bit of Stevia Dinner - 6-8oz meat, vegetable, potato. Gets salad when she goes out to eat Snacks - unsalted nuts, popcorn Drinks - water, occasionally has an unsweet tea, 2-3 cups of coffee a day. Rarely drinks alcohol   Exercise: 2 dogs - walks 30 minutes twice daily, yard work, gets 10,000+ steps a day   Labs: 10/21/20: TC 227, TG 69, HDL 80, LDL 135 (no LLT)  Wt Readings from Last 3 Encounters:  02/09/21 162 lb 4.8 oz (73.6 kg)  01/25/21 162 lb (73.5 kg)  01/11/21 160 lb (72.6 kg)   BP Readings from Last 3 Encounters:  02/09/21 129/73  01/25/21 128/70  01/11/21  140/87   Pulse Readings from Last 3 Encounters:  02/09/21 81  01/25/21 65  01/11/21 80    Renal function: CrCl cannot be calculated (Patient's most recent lab result is older than the maximum 21 days allowed.).  Past Medical History:  Diagnosis Date   Cancer ([place]) 2021   History of hiatal hernia    Hypothyroidism    Peripheral vascular disease ([place])    varicose veins    Current Outpatient Medications on File Prior to Visit  Medication Sig Dispense Refill   acetaminophen (TYLENOL) 650 MG CR tablet Take 1,300 mg by mouth every 8 (eight) hours as needed for pain.      albuterol (VENTOLIN HFA) 108 (90 Base) MCG/ACT inhaler Inhale 2 puffs into the lungs every 6 (six) hours as needed for wheezing or shortness of breath. 8 g 6   B Complex Vitamins (B COMPLEX VITAMIN PO) Take 1 tablet by mouth daily.     Calcium Carb-Cholecalciferol (CALCIUM + D3 PO) Take 1 tablet by mouth daily.      EPINEPHrine 0.3 mg/0.3 mL IJ SOAJ injection See admin instructions.     levothyroxine (SYNTHROID) 50 MCG tablet Take 50 mcg by mouth every other day. Alternating with 75 mcg     levothyroxine (SYNTHROID) 75 MCG tablet Take 75 mcg by mouth every other day. Alternating with 50 mcg     lidocaine (LIDODERM) 5 % Place 1 patch onto the skin daily as needed (pain). Remove & Discard patch within 12 hours or as directed by MD     magnesium oxide (MAG-OX) 400 MG tablet Take 400 mg by mouth daily.     Multiple Vitamins-Minerals (OCUVITE EYE HEALTH FORMULA PO) Take 1 tablet by mouth daily.     omalizumab Arvid Right) 150 MG/ML prefilled syringe Inject 150 mg into the skin every 28 (twenty-eight) days.     OVER THE COUNTER MEDICATION Take 6 capsules by mouth daily. Balance of Nature Fruits & Veggies supplement (3 Veggies + 3 Fruit)     polyvinyl alcohol (LIQUIFILM TEARS) 1.4 % ophthalmic solution Place 1 drop into both eyes as needed for dry eyes.     pravastatin (PRAVACHOL) 20 MG tablet Take 1 tablet (20 mg total) by mouth every evening. 30 tablet 1   No current facility-administered medications on file prior to visit.    Allergies  Allergen Reactions   Codeine Nausea Only and Other (See Comments)    Severe headaches    Rosuvastatin Other (See Comments)    Pt reports aching legs and leg cramps   Sulfa Antibiotics Nausea And Vomiting   Sulfacetamide Sodium-Sulfur Other (See Comments)   Voltaren [Diclofenac Sodium] Itching and Rash     Assessment/Plan:  1. Hypertension - BP elevated above goal < 130/59mmHg in clinic and at home. Will start amlodipine 5mg  daily. Pt encouraged to limit daily  sodium to 000mg . She will monitor BP at home and I'll call her in 2 weeks to follow up with home readings.  2. Hyperlipidemia - Pt intolerant to rosuvastatin 10mg  daily and now pravastatin 20mg  daily (joint pain). LDL goal < 100mg /dL for primary prevention. Advised pt to stop pravastatin, joint pain should resolve within the next week or so. Once she feels better, will start ezetimibe 10mg  daily.  Yuridiana Formanek E. Zavannah Deblois, PharmD, BCACP, [place] [address] [place],  [address] Phone: [phone]; Fax: [phone] 04/08/2021 2:52 PM

## 2021-04-08 NOTE — Patient Instructions (Addendum)
It was nice to see you today!  Your blood pressure goal is < 130/52mmHg -Start taking amlodipine 5mg , 1 tablet once daily -Limit your daily sodium to < 2,000mg   -Continue to monitor your blood pressure at home. I'll give you a call in 2 weeks to see how your readings are looking   Your LDL cholesterol goal is < 100 -Stop taking pravastatin, keep an eye on your joint pain, this should resolve within the next week or so -Once you feel back to normal, start taking ezetimibe (Zetia) 10mg , 1 tablet once daily

## 2021-04-22 ENCOUNTER — Telehealth: Payer: Self-pay | Admitting: Pharmacist

## 2021-04-22 DIAGNOSIS — L501 Idiopathic urticaria: Secondary | ICD-10-CM | POA: Diagnosis not present

## 2021-04-22 NOTE — Telephone Encounter (Signed)
Called pt to follow up after last visit 2 weeks ago. BP today was 146/84, 140s/70s in AM, 160s/80s in PM. Has been on amlodipine 5mg  daily for 1 week and tolerating well. Reports joint pain improved after stopping pravastatin within 5-6 days. Did start Zetia about 1 week ago and has been tolerating it well so far.  Since pt has only been on amlodipine for 1 week, will continue at same dose for 1 more week to see if BP improves anymore. Will call pt next week for update on home BP readings and will likely need to increase her amlodipine dose to 10mg .

## 2021-04-29 NOTE — Telephone Encounter (Signed)
Called pt to follow up with home BP readings since pt has been on amlodipine for 2 weeks now. Pt reports big improvement in her readings.  12/5: 125/68 12/6: 112/64 12/7: 121/69 12/8: 134/75, 140/73  Will continue current dose of amlodipine 5mg  daily. Pt advised to call clinic with any BP concerns.

## 2021-05-25 DIAGNOSIS — M818 Other osteoporosis without current pathological fracture: Secondary | ICD-10-CM | POA: Diagnosis not present

## 2021-05-25 DIAGNOSIS — E039 Hypothyroidism, unspecified: Secondary | ICD-10-CM | POA: Diagnosis not present

## 2021-05-25 DIAGNOSIS — Z79899 Other long term (current) drug therapy: Secondary | ICD-10-CM | POA: Diagnosis not present

## 2021-07-04 DIAGNOSIS — M810 Age-related osteoporosis without current pathological fracture: Secondary | ICD-10-CM | POA: Diagnosis not present

## 2021-07-15 DIAGNOSIS — E039 Hypothyroidism, unspecified: Secondary | ICD-10-CM | POA: Diagnosis not present

## 2021-07-15 DIAGNOSIS — R9389 Abnormal findings on diagnostic imaging of other specified body structures: Secondary | ICD-10-CM | POA: Diagnosis not present

## 2021-07-18 DIAGNOSIS — H02831 Dermatochalasis of right upper eyelid: Secondary | ICD-10-CM | POA: Diagnosis not present

## 2021-07-18 DIAGNOSIS — D3132 Benign neoplasm of left choroid: Secondary | ICD-10-CM | POA: Diagnosis not present

## 2021-07-18 DIAGNOSIS — Z961 Presence of intraocular lens: Secondary | ICD-10-CM | POA: Diagnosis not present

## 2021-07-18 DIAGNOSIS — H527 Unspecified disorder of refraction: Secondary | ICD-10-CM | POA: Diagnosis not present

## 2021-07-18 DIAGNOSIS — H04123 Dry eye syndrome of bilateral lacrimal glands: Secondary | ICD-10-CM | POA: Diagnosis not present

## 2021-07-18 DIAGNOSIS — H35342 Macular cyst, hole, or pseudohole, left eye: Secondary | ICD-10-CM | POA: Diagnosis not present

## 2021-07-18 DIAGNOSIS — H02834 Dermatochalasis of left upper eyelid: Secondary | ICD-10-CM | POA: Diagnosis not present

## 2021-07-28 ENCOUNTER — Telehealth: Payer: Self-pay | Admitting: Pharmacist

## 2021-07-28 NOTE — Telephone Encounter (Signed)
Called pt and left message to see if she has had lipid panel checked elsewhere since starting on ezetimibe last winter for her cholesterol. Her LDL goal is < 100 and she's previously intolerant to rosuvastatin 10mg  daily and pravastatin 20mg  daily (joint pain). ?

## 2021-08-05 ENCOUNTER — Inpatient Hospital Stay: Payer: Medicare PPO

## 2021-08-06 ENCOUNTER — Ambulatory Visit (HOSPITAL_COMMUNITY)
Admission: RE | Admit: 2021-08-06 | Discharge: 2021-08-06 | Disposition: A | Discharge status: Home / Self Care | Payer: Medicare PPO | Source: Ambulatory Visit | Attending: Internal Medicine | Admitting: Internal Medicine

## 2021-08-06 ENCOUNTER — Other Ambulatory Visit: Payer: Self-pay

## 2021-08-06 DIAGNOSIS — C3490 Malignant neoplasm of unspecified part of unspecified bronchus or lung: Secondary | ICD-10-CM | POA: Diagnosis not present

## 2021-08-06 DIAGNOSIS — J984 Other disorders of lung: Secondary | ICD-10-CM | POA: Diagnosis not present

## 2021-08-06 LAB — POCT I-STAT CREATININE: Creatinine, Ser: 0.8 mg/dL (ref 0.44–1.00)

## 2021-08-06 MED ORDER — SODIUM CHLORIDE (PF) 0.9 % IJ SOLN
INTRAMUSCULAR | Status: AC
  Filled 2021-08-06: qty 50

## 2021-08-06 MED ORDER — IOHEXOL 300 MG/ML  SOLN
75.0000 mL | Freq: Once | INTRAMUSCULAR | Status: AC | PRN
  Administered 2021-08-06: 75 mL via INTRAVENOUS

## 2021-08-08 ENCOUNTER — Inpatient Hospital Stay: Payer: Medicare PPO | Attending: Internal Medicine | Admitting: Internal Medicine

## 2021-08-08 ENCOUNTER — Other Ambulatory Visit: Payer: Self-pay

## 2021-08-08 VITALS — BP 147/77 | HR 76 | Temp 97.0°F | Resp 18 | Wt 161.2 lb

## 2021-08-08 DIAGNOSIS — Z79899 Other long term (current) drug therapy: Secondary | ICD-10-CM | POA: Insufficient documentation

## 2021-08-08 DIAGNOSIS — C3481 Malignant neoplasm of overlapping sites of right bronchus and lung: Secondary | ICD-10-CM | POA: Diagnosis not present

## 2021-08-08 DIAGNOSIS — I700 Atherosclerosis of aorta: Secondary | ICD-10-CM | POA: Diagnosis not present

## 2021-08-08 DIAGNOSIS — E039 Hypothyroidism, unspecified: Secondary | ICD-10-CM | POA: Diagnosis not present

## 2021-08-08 DIAGNOSIS — I739 Peripheral vascular disease, unspecified: Secondary | ICD-10-CM | POA: Diagnosis not present

## 2021-08-08 DIAGNOSIS — D3A090 Benign carcinoid tumor of the bronchus and lung: Secondary | ICD-10-CM | POA: Diagnosis not present

## 2021-08-08 NOTE — Progress Notes (Signed)
?    [facility] ?[phone]   [phone] ? ?OFFICE PROGRESS NOTE ? ?Masneri, [name], DO ?[address] ?[place] [address] ? 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 ? ?PRIOR THERAPY: Status post right middle lobectomy with right upper lobe wedge resection and lymph node dissection under the care of Dr. Roxan Hockey on 01/15/2020. ? ?CURRENT THERAPY: Observation.  ? ?INTERVAL HISTORY: ?Cynthia Rhodes 76 y.o. female returns to the clinic today for 60-month follow-up visit.  The patient is feeling fine today with no concerning complaints.  She denied having any current chest pain, shortness of breath, cough or hemoptysis.  She has no recent weight loss or night sweats.  She has no nausea, vomiting, diarrhea or constipation.  She denied having any headache or visual changes.  She is here today for evaluation with repeat CT scan of the chest for restaging of her disease. ? ?MEDICAL HISTORY: ?Past Medical History:  ?Diagnosis Date  ? Cancer [facility]) 2021  ? History of hiatal hernia   ? Hypothyroidism   ? Peripheral vascular disease ([place])   ? varicose veins  ? ? ?ALLERGIES:  is allergic to codeine, pravastatin, rosuvastatin, sulfa antibiotics, sulfacetamide sodium-sulfur, and voltaren [diclofenac sodium]. ? ?MEDICATIONS:  ?Current Outpatient Medications  ?Medication Sig Dispense Refill  ? acetaminophen (TYLENOL) 650 MG CR tablet Take 1,300 mg by mouth every 8 (eight) hours as needed for pain.    ? albuterol (VENTOLIN HFA) 108 (90 Base) MCG/ACT inhaler Inhale 2 puffs into the lungs every 6 (six) hours as needed for wheezing or shortness of breath. 8 g 6  ? amLODipine (NORVASC) 5 MG tablet Take 1 tablet (5 mg total) by mouth daily. 30 tablet 11  ? B Complex Vitamins (B COMPLEX VITAMIN PO) Take 1 tablet by mouth daily.    ? Calcium  Carb-Cholecalciferol (CALCIUM + D3 PO) Take 1 tablet by mouth daily.     ? EPINEPHrine 0.3 mg/0.3 mL IJ SOAJ injection See admin instructions.    ? ezetimibe (ZETIA) 10 MG tablet Take 1 tablet (10 mg total) by mouth daily. 30 tablet 11  ? levothyroxine (SYNTHROID) 50 MCG tablet Take 50 mcg by mouth every other day. Alternating with 75 mcg    ? levothyroxine (SYNTHROID) 75 MCG tablet Take 75 mcg by mouth every other day. Alternating with 50 mcg    ? lidocaine (LIDODERM) 5 % Place 1 patch onto the skin daily as needed (pain). Remove & Discard patch within 12 hours or as directed by MD    ? magnesium oxide (MAG-OX) 400 MG tablet Take 400 mg by mouth daily.    ? Multiple Vitamins-Minerals (OCUVITE EYE HEALTH FORMULA PO) Take 1 tablet by mouth daily.    ? omalizumab [place] Right) 150 MG/ML prefilled syringe Inject 150 mg into the skin every 28 (twenty-eight) days.    ? OVER THE COUNTER MEDICATION Take 6 capsules by mouth daily. Balance of Nature Fruits & Veggies supplement (3 Veggies + 3 Fruit)    ? polyvinyl alcohol (LIQUIFILM TEARS) 1.4 % ophthalmic solution Place 1 drop into both eyes as needed for dry eyes.    ? ?No current facility-administered medications for this visit.  ? ? ?SURGICAL HISTORY:  ?Past Surgical History:  ?Procedure Laterality Date  ? ABDOMINAL HYSTERECTOMY    ? APPENDECTOMY  1954  ? BRONCHIAL BIOPSY  11/04/2019  ? Procedure: BRONCHIAL BIOPSIES;  Surgeon: Collene Gobble, MD;  Location: [facility] ENDOSCOPY;  Service: Pulmonary;;  ? BRONCHIAL BRUSHINGS  11/04/2019  ? Procedure: BRONCHIAL BRUSHINGS;  Surgeon: Collene Gobble, MD;  Location: [facility] ENDOSCOPY;  Service: Pulmonary;;  ? BRONCHIAL NEEDLE ASPIRATION BIOPSY  11/04/2019  ? Procedure: BRONCHIAL NEEDLE ASPIRATION BIOPSIES;  Surgeon: Collene Gobble, MD;  Location: [facility] ENDOSCOPY;  Service: Pulmonary;;  ? BRONCHIAL WASHINGS  11/04/2019  ? Procedure: BRONCHIAL WASHINGS;  Surgeon: Collene Gobble, MD;  Location: [facility] ENDOSCOPY;  Service: Pulmonary;;  ? BUNIONECTOMY  Bilateral   ? CHOLECYSTECTOMY    ? [place]  ? EYE SURGERY Bilateral 2021  ? INTERCOSTAL NERVE BLOCK Right 01/15/2020  ? Procedure: INTERCOSTAL NERVE BLOCK;  Surgeon: Melrose Nakayama, MD;  Location: [facility];  Service: Thoracic;  Laterality: Right;  ? JOINT REPLACEMENT Bilateral   ? Knee  ? KNEE SURGERY Bilateral   ? LYMPH NODE DISSECTION N/A 01/15/2020  ? Procedure: LYMPH NODE DISSECTION;  Surgeon: Melrose Nakayama, MD;  Location: [facility];  Service: Thoracic;  Laterality: N/A;  ? REPLACEMENT TOTAL KNEE BILATERAL    ? SIGMOIDECTOMY    ? TONSILLECTOMY    ? VIDEO BRONCHOSCOPY WITH ENDOBRONCHIAL NAVIGATION N/A 11/04/2019  ? Procedure: VIDEO BRONCHOSCOPY WITH ENDOBRONCHIAL NAVIGATION;  Surgeon: Collene Gobble, MD;  Location: [facility] ENDOSCOPY;  Service: Pulmonary;  Laterality: N/A;  ? VIDEO BRONCHOSCOPY WITH ENDOBRONCHIAL NAVIGATION N/A 01/15/2020  ? Procedure: VIDEO BRONCHOSCOPY WITH ENDOBRONCHIAL NAVIGATION to mark right upper lobe nodule;  Surgeon: Melrose Nakayama, MD;  Location: [facility] OR;  Service: Thoracic;  Laterality: N/A;  ? ? ?REVIEW OF SYSTEMS:  A comprehensive review of systems was negative.  ? ?PHYSICAL EXAMINATION: General appearance: alert, cooperative, and no distress ?Head: Normocephalic, without obvious abnormality, atraumatic ?Neck: no adenopathy, no JVD, supple, symmetrical, trachea midline, and thyroid not enlarged, symmetric, no tenderness/mass/nodules ?Lymph nodes: Cervical, supraclavicular, and axillary nodes normal. ?Resp: clear to auscultation bilaterally ?Back: symmetric, no curvature. ROM normal. No CVA tenderness. ?Cardio: regular rate and rhythm, S1, S2 normal, no murmur, click, rub or gallop ?GI: soft, non-tender; bowel sounds normal; no masses,  no organomegaly ?Extremities: extremities normal, atraumatic, no cyanosis or edema ? ?ECOG PERFORMANCE STATUS: 0 - Asymptomatic ? ?Blood pressure (!) 147/77, pulse 76, temperature (!) 97 ?F (36.1 ?C), temperature source Tympanic, resp.  rate 18, weight 161 lb 3 oz (73.1 kg), SpO2 98 %. ? ?LABORATORY DATA: ?Lab Results  ?Component Value Date  ? WBC 6.5 02/08/2021  ? HGB 13.2 02/08/2021  ? HCT 39.8 02/08/2021  ? MCV 90.7 02/08/2021  ? PLT 344 02/08/2021  ? ? ?  Chemistry   ?   ?Component Value Date/Time  ? NA 141 02/08/2021 0924  ? K 4.9 02/08/2021 0924  ? CL 107 02/08/2021 0924  ? CO2 26 02/08/2021 0924  ? BUN 23 02/08/2021 0924  ? CREATININE 0.80 08/06/2021 1012  ? CREATININE 0.89 02/08/2021 0924  ?    ?Component Value Date/Time  ? CALCIUM 9.3 02/08/2021 0924  ? ALKPHOS 107 02/08/2021 0924  ? AST 43 (H) 02/08/2021 0924  ? ALT 37 02/08/2021 0924  ? BILITOT 0.5 02/08/2021 5027  ?  ? ? ? ?RADIOGRAPHIC STUDIES: ?CT Chest W Contrast ? ?Result Date: 08/08/2021 ?CLINICAL DATA:  76 year old female with history of non-small cell lung cancer. Staging examination. EXAM: CT CHEST WITH CONTRAST TECHNIQUE: Multidetector CT imaging of the chest was performed during intravenous contrast administration. RADIATION DOSE REDUCTION:  This exam was performed according to the departmental dose-optimization program which includes automated exposure control, adjustment of the mA and/or kV according to patient size and/or use of iterative reconstruction technique. CONTRAST:  22mL OMNIPAQUE IOHEXOL 300 MG/ML  SOLN COMPARISON:  Chest CT 02/08/2021. FINDINGS: Cardiovascular: Heart size is normal. There is no significant pericardial fluid, thickening or pericardial calcification. Aortic atherosclerosis. No definite coronary artery calcifications. Mediastinum/Nodes: No pathologically enlarged mediastinal or hilar lymph nodes. Esophagus is unremarkable in appearance. No axillary lymphadenopathy. Lungs/Pleura: Status post right upper lobectomy. Compensatory hyperexpansion of the right middle and lower lobes. There continues to be some nodular scarring in the medial segment of the right middle lobe, stable compared to prior examinations. No other definite suspicious appearing  pulmonary nodules or masses are noted. No acute consolidative airspace disease. No pleural effusions. Upper Abdomen: Aortic atherosclerosis. Musculoskeletal: There are no aggressive appearing lytic or blastic lesions noted in th

## 2021-08-23 DIAGNOSIS — I10 Essential (primary) hypertension: Secondary | ICD-10-CM | POA: Diagnosis not present

## 2021-08-23 DIAGNOSIS — R7303 Prediabetes: Secondary | ICD-10-CM | POA: Diagnosis not present

## 2021-09-14 DIAGNOSIS — L501 Idiopathic urticaria: Secondary | ICD-10-CM | POA: Diagnosis not present

## 2021-09-19 DIAGNOSIS — K56699 Other intestinal obstruction unspecified as to partial versus complete obstruction: Secondary | ICD-10-CM | POA: Diagnosis not present

## 2021-09-19 DIAGNOSIS — R948 Abnormal results of function studies of other organs and systems: Secondary | ICD-10-CM | POA: Diagnosis not present

## 2021-09-19 DIAGNOSIS — Z9049 Acquired absence of other specified parts of digestive tract: Secondary | ICD-10-CM | POA: Diagnosis not present

## 2021-10-12 DIAGNOSIS — L501 Idiopathic urticaria: Secondary | ICD-10-CM | POA: Diagnosis not present

## 2021-10-20 DIAGNOSIS — F4323 Adjustment disorder with mixed anxiety and depressed mood: Secondary | ICD-10-CM | POA: Diagnosis not present

## 2021-10-27 DIAGNOSIS — M25651 Stiffness of right hip, not elsewhere classified: Secondary | ICD-10-CM | POA: Diagnosis not present

## 2021-10-27 DIAGNOSIS — R2689 Other abnormalities of gait and mobility: Secondary | ICD-10-CM | POA: Diagnosis not present

## 2021-10-27 DIAGNOSIS — M5386 Other specified dorsopathies, lumbar region: Secondary | ICD-10-CM | POA: Diagnosis not present

## 2021-10-27 DIAGNOSIS — G894 Chronic pain syndrome: Secondary | ICD-10-CM | POA: Diagnosis not present

## 2021-10-27 DIAGNOSIS — M25551 Pain in right hip: Secondary | ICD-10-CM | POA: Diagnosis not present

## 2021-10-28 DIAGNOSIS — F4323 Adjustment disorder with mixed anxiety and depressed mood: Secondary | ICD-10-CM | POA: Diagnosis not present

## 2021-11-01 DIAGNOSIS — G894 Chronic pain syndrome: Secondary | ICD-10-CM | POA: Diagnosis not present

## 2021-11-01 DIAGNOSIS — M25551 Pain in right hip: Secondary | ICD-10-CM | POA: Diagnosis not present

## 2021-11-01 DIAGNOSIS — R2689 Other abnormalities of gait and mobility: Secondary | ICD-10-CM | POA: Diagnosis not present

## 2021-11-01 DIAGNOSIS — M5386 Other specified dorsopathies, lumbar region: Secondary | ICD-10-CM | POA: Diagnosis not present

## 2021-11-01 DIAGNOSIS — M25651 Stiffness of right hip, not elsewhere classified: Secondary | ICD-10-CM | POA: Diagnosis not present

## 2021-11-03 DIAGNOSIS — R2689 Other abnormalities of gait and mobility: Secondary | ICD-10-CM | POA: Diagnosis not present

## 2021-11-03 DIAGNOSIS — G894 Chronic pain syndrome: Secondary | ICD-10-CM | POA: Diagnosis not present

## 2021-11-03 DIAGNOSIS — M25551 Pain in right hip: Secondary | ICD-10-CM | POA: Diagnosis not present

## 2021-11-03 DIAGNOSIS — M25651 Stiffness of right hip, not elsewhere classified: Secondary | ICD-10-CM | POA: Diagnosis not present

## 2021-11-03 DIAGNOSIS — M5386 Other specified dorsopathies, lumbar region: Secondary | ICD-10-CM | POA: Diagnosis not present

## 2021-11-08 DIAGNOSIS — M5386 Other specified dorsopathies, lumbar region: Secondary | ICD-10-CM | POA: Diagnosis not present

## 2021-11-08 DIAGNOSIS — M25651 Stiffness of right hip, not elsewhere classified: Secondary | ICD-10-CM | POA: Diagnosis not present

## 2021-11-08 DIAGNOSIS — G894 Chronic pain syndrome: Secondary | ICD-10-CM | POA: Diagnosis not present

## 2021-11-08 DIAGNOSIS — M25551 Pain in right hip: Secondary | ICD-10-CM | POA: Diagnosis not present

## 2021-11-08 DIAGNOSIS — R2689 Other abnormalities of gait and mobility: Secondary | ICD-10-CM | POA: Diagnosis not present

## 2021-11-09 DIAGNOSIS — L501 Idiopathic urticaria: Secondary | ICD-10-CM | POA: Diagnosis not present

## 2021-11-10 DIAGNOSIS — M5386 Other specified dorsopathies, lumbar region: Secondary | ICD-10-CM | POA: Diagnosis not present

## 2021-11-10 DIAGNOSIS — R2689 Other abnormalities of gait and mobility: Secondary | ICD-10-CM | POA: Diagnosis not present

## 2021-11-10 DIAGNOSIS — M25551 Pain in right hip: Secondary | ICD-10-CM | POA: Diagnosis not present

## 2021-11-10 DIAGNOSIS — G894 Chronic pain syndrome: Secondary | ICD-10-CM | POA: Diagnosis not present

## 2021-11-10 DIAGNOSIS — M25651 Stiffness of right hip, not elsewhere classified: Secondary | ICD-10-CM | POA: Diagnosis not present

## 2021-11-15 DIAGNOSIS — F4323 Adjustment disorder with mixed anxiety and depressed mood: Secondary | ICD-10-CM | POA: Diagnosis not present

## 2021-11-17 DIAGNOSIS — M25651 Stiffness of right hip, not elsewhere classified: Secondary | ICD-10-CM | POA: Diagnosis not present

## 2021-11-17 DIAGNOSIS — M25551 Pain in right hip: Secondary | ICD-10-CM | POA: Diagnosis not present

## 2021-11-17 DIAGNOSIS — R2689 Other abnormalities of gait and mobility: Secondary | ICD-10-CM | POA: Diagnosis not present

## 2021-11-17 DIAGNOSIS — M5386 Other specified dorsopathies, lumbar region: Secondary | ICD-10-CM | POA: Diagnosis not present

## 2021-11-17 DIAGNOSIS — G894 Chronic pain syndrome: Secondary | ICD-10-CM | POA: Diagnosis not present

## 2021-11-24 DIAGNOSIS — M810 Age-related osteoporosis without current pathological fracture: Secondary | ICD-10-CM | POA: Diagnosis not present

## 2021-11-24 DIAGNOSIS — Z79899 Other long term (current) drug therapy: Secondary | ICD-10-CM | POA: Diagnosis not present

## 2021-11-24 DIAGNOSIS — E039 Hypothyroidism, unspecified: Secondary | ICD-10-CM | POA: Diagnosis not present

## 2021-11-29 DIAGNOSIS — M25551 Pain in right hip: Secondary | ICD-10-CM | POA: Diagnosis not present

## 2021-11-29 DIAGNOSIS — M5386 Other specified dorsopathies, lumbar region: Secondary | ICD-10-CM | POA: Diagnosis not present

## 2021-11-29 DIAGNOSIS — R2689 Other abnormalities of gait and mobility: Secondary | ICD-10-CM | POA: Diagnosis not present

## 2021-11-29 DIAGNOSIS — M25651 Stiffness of right hip, not elsewhere classified: Secondary | ICD-10-CM | POA: Diagnosis not present

## 2021-11-29 DIAGNOSIS — G894 Chronic pain syndrome: Secondary | ICD-10-CM | POA: Diagnosis not present

## 2021-11-30 DIAGNOSIS — M810 Age-related osteoporosis without current pathological fracture: Secondary | ICD-10-CM | POA: Diagnosis not present

## 2021-11-30 DIAGNOSIS — Z79899 Other long term (current) drug therapy: Secondary | ICD-10-CM | POA: Diagnosis not present

## 2021-12-01 DIAGNOSIS — M65331 Trigger finger, right middle finger: Secondary | ICD-10-CM | POA: Diagnosis not present

## 2021-12-01 DIAGNOSIS — M13841 Other specified arthritis, right hand: Secondary | ICD-10-CM | POA: Diagnosis not present

## 2021-12-01 DIAGNOSIS — M79642 Pain in left hand: Secondary | ICD-10-CM | POA: Diagnosis not present

## 2021-12-01 DIAGNOSIS — M79641 Pain in right hand: Secondary | ICD-10-CM | POA: Diagnosis not present

## 2021-12-05 DIAGNOSIS — F4323 Adjustment disorder with mixed anxiety and depressed mood: Secondary | ICD-10-CM | POA: Diagnosis not present

## 2021-12-05 DIAGNOSIS — L723 Sebaceous cyst: Secondary | ICD-10-CM | POA: Diagnosis not present

## 2021-12-08 DIAGNOSIS — G894 Chronic pain syndrome: Secondary | ICD-10-CM | POA: Diagnosis not present

## 2021-12-08 DIAGNOSIS — M25551 Pain in right hip: Secondary | ICD-10-CM | POA: Diagnosis not present

## 2021-12-08 DIAGNOSIS — M25651 Stiffness of right hip, not elsewhere classified: Secondary | ICD-10-CM | POA: Diagnosis not present

## 2021-12-08 DIAGNOSIS — R2689 Other abnormalities of gait and mobility: Secondary | ICD-10-CM | POA: Diagnosis not present

## 2021-12-08 DIAGNOSIS — M5386 Other specified dorsopathies, lumbar region: Secondary | ICD-10-CM | POA: Diagnosis not present

## 2021-12-09 DIAGNOSIS — L501 Idiopathic urticaria: Secondary | ICD-10-CM | POA: Diagnosis not present

## 2021-12-12 DIAGNOSIS — F4323 Adjustment disorder with mixed anxiety and depressed mood: Secondary | ICD-10-CM | POA: Diagnosis not present

## 2021-12-13 DIAGNOSIS — M25651 Stiffness of right hip, not elsewhere classified: Secondary | ICD-10-CM | POA: Diagnosis not present

## 2021-12-13 DIAGNOSIS — G894 Chronic pain syndrome: Secondary | ICD-10-CM | POA: Diagnosis not present

## 2021-12-13 DIAGNOSIS — M25551 Pain in right hip: Secondary | ICD-10-CM | POA: Diagnosis not present

## 2021-12-13 DIAGNOSIS — R2689 Other abnormalities of gait and mobility: Secondary | ICD-10-CM | POA: Diagnosis not present

## 2021-12-13 DIAGNOSIS — M5386 Other specified dorsopathies, lumbar region: Secondary | ICD-10-CM | POA: Diagnosis not present

## 2021-12-14 ENCOUNTER — Telehealth: Payer: Self-pay | Admitting: Internal Medicine

## 2021-12-14 NOTE — Telephone Encounter (Signed)
Called patient regarding upcoming 2024 appointment, patient has been called and voicemail was left.

## 2021-12-15 DIAGNOSIS — M25551 Pain in right hip: Secondary | ICD-10-CM | POA: Diagnosis not present

## 2021-12-15 DIAGNOSIS — M5386 Other specified dorsopathies, lumbar region: Secondary | ICD-10-CM | POA: Diagnosis not present

## 2021-12-15 DIAGNOSIS — M25651 Stiffness of right hip, not elsewhere classified: Secondary | ICD-10-CM | POA: Diagnosis not present

## 2021-12-15 DIAGNOSIS — G894 Chronic pain syndrome: Secondary | ICD-10-CM | POA: Diagnosis not present

## 2021-12-15 DIAGNOSIS — R2689 Other abnormalities of gait and mobility: Secondary | ICD-10-CM | POA: Diagnosis not present

## 2021-12-20 DIAGNOSIS — R2689 Other abnormalities of gait and mobility: Secondary | ICD-10-CM | POA: Diagnosis not present

## 2021-12-20 DIAGNOSIS — M25551 Pain in right hip: Secondary | ICD-10-CM | POA: Diagnosis not present

## 2021-12-20 DIAGNOSIS — M5386 Other specified dorsopathies, lumbar region: Secondary | ICD-10-CM | POA: Diagnosis not present

## 2021-12-20 DIAGNOSIS — M25651 Stiffness of right hip, not elsewhere classified: Secondary | ICD-10-CM | POA: Diagnosis not present

## 2021-12-20 DIAGNOSIS — G894 Chronic pain syndrome: Secondary | ICD-10-CM | POA: Diagnosis not present

## 2021-12-27 DIAGNOSIS — M25651 Stiffness of right hip, not elsewhere classified: Secondary | ICD-10-CM | POA: Diagnosis not present

## 2021-12-27 DIAGNOSIS — M25551 Pain in right hip: Secondary | ICD-10-CM | POA: Diagnosis not present

## 2021-12-27 DIAGNOSIS — M5386 Other specified dorsopathies, lumbar region: Secondary | ICD-10-CM | POA: Diagnosis not present

## 2021-12-27 DIAGNOSIS — R2689 Other abnormalities of gait and mobility: Secondary | ICD-10-CM | POA: Diagnosis not present

## 2021-12-27 DIAGNOSIS — G894 Chronic pain syndrome: Secondary | ICD-10-CM | POA: Diagnosis not present

## 2021-12-30 DIAGNOSIS — M5386 Other specified dorsopathies, lumbar region: Secondary | ICD-10-CM | POA: Diagnosis not present

## 2021-12-30 DIAGNOSIS — G894 Chronic pain syndrome: Secondary | ICD-10-CM | POA: Diagnosis not present

## 2021-12-30 DIAGNOSIS — M25551 Pain in right hip: Secondary | ICD-10-CM | POA: Diagnosis not present

## 2021-12-30 DIAGNOSIS — R2689 Other abnormalities of gait and mobility: Secondary | ICD-10-CM | POA: Diagnosis not present

## 2021-12-30 DIAGNOSIS — M25651 Stiffness of right hip, not elsewhere classified: Secondary | ICD-10-CM | POA: Diagnosis not present

## 2022-01-09 DIAGNOSIS — M810 Age-related osteoporosis without current pathological fracture: Secondary | ICD-10-CM | POA: Diagnosis not present

## 2022-01-09 DIAGNOSIS — F4323 Adjustment disorder with mixed anxiety and depressed mood: Secondary | ICD-10-CM | POA: Diagnosis not present

## 2022-01-17 DIAGNOSIS — L501 Idiopathic urticaria: Secondary | ICD-10-CM | POA: Diagnosis not present

## 2022-01-19 DIAGNOSIS — R2231 Localized swelling, mass and lump, right upper limb: Secondary | ICD-10-CM | POA: Diagnosis not present

## 2022-01-19 DIAGNOSIS — M1811 Unilateral primary osteoarthritis of first carpometacarpal joint, right hand: Secondary | ICD-10-CM | POA: Diagnosis not present

## 2022-01-19 DIAGNOSIS — M79641 Pain in right hand: Secondary | ICD-10-CM | POA: Diagnosis not present

## 2022-01-19 DIAGNOSIS — M1812 Unilateral primary osteoarthritis of first carpometacarpal joint, left hand: Secondary | ICD-10-CM | POA: Diagnosis not present

## 2022-01-19 DIAGNOSIS — M65331 Trigger finger, right middle finger: Secondary | ICD-10-CM | POA: Diagnosis not present

## 2022-01-19 DIAGNOSIS — M79642 Pain in left hand: Secondary | ICD-10-CM | POA: Diagnosis not present

## 2022-01-27 DIAGNOSIS — H903 Sensorineural hearing loss, bilateral: Secondary | ICD-10-CM | POA: Diagnosis not present

## 2022-02-06 DIAGNOSIS — F4323 Adjustment disorder with mixed anxiety and depressed mood: Secondary | ICD-10-CM | POA: Diagnosis not present

## 2022-02-13 ENCOUNTER — Encounter: Payer: Self-pay | Admitting: *Deleted

## 2022-02-13 ENCOUNTER — Telehealth: Payer: Self-pay | Admitting: *Deleted

## 2022-02-13 NOTE — Patient Instructions (Signed)
Visit Information  Thank you for taking time to visit with me today. Please don't hesitate to contact me if I can be of assistance to you.   Following are the goals we discussed today:   Goals Addressed               This Visit's Progress     COMPLETED: No needs (pt-stated)        Care Coordination Interventions: Advised patient to contact her provider's office and scheduled her AWV for 2023 Reviewed medications with patient and discussed adherence to all medication and educate accordingly Reviewed scheduled/upcoming provider appointments including pending appointments Screening for signs and symptoms of depression related to chronic disease state  Assessed social determinant of health barriers          Please call the care guide team at [phone] if you need to cancel or reschedule your appointment.   If you are experiencing a Mental Health or [place] or need someone to talk to, please call the Suicide and Crisis Lifeline: 988  Patient verbalizes understanding of instructions and care plan provided today and agrees to view in [place]. Active MyChart status and patient understanding of how to access instructions and care plan via MyChart confirmed with patient.     No further follow up required: No needs  Raina Mina, RN Care Management Coordinator [place] Office [phone]

## 2022-02-13 NOTE — Patient Outreach (Signed)
  Care Coordination   Initial Visit Note   02/13/2022 Name: Cynthia Rhodes MRN: [id number] DOB: Aug 25, 1945  Cynthia Rhodes is a 76 y.o. year old female who sees Masneri, [name], DO for primary care. I spoke with  Donah Driver by phone today.  What matters to the patients health and wellness today?  No needs    Goals Addressed               This Visit's Progress     COMPLETED: No needs (pt-stated)        Care Coordination Interventions: Advised patient to contact her provider's office and scheduled her AWV for 2023 Reviewed medications with patient and discussed adherence to all medication and educate accordingly Reviewed scheduled/upcoming provider appointments including pending appointments Screening for signs and symptoms of depression related to chronic disease state  Assessed social determinant of health barriers         SDOH assessments and interventions completed:  Yes  SDOH Interventions Today    Flowsheet Row Most Recent Value  SDOH Interventions   Food Insecurity Interventions Intervention Not Indicated  Housing Interventions Intervention Not Indicated  Transportation Interventions Intervention Not Indicated        Care Coordination Interventions Activated:  Yes  Care Coordination Interventions:  Yes, provided   Follow up plan: No further intervention required.   Encounter Outcome:  Pt. Visit Completed

## 2022-02-14 ENCOUNTER — Other Ambulatory Visit: Payer: Self-pay | Admitting: Family Medicine

## 2022-02-14 DIAGNOSIS — R053 Chronic cough: Secondary | ICD-10-CM | POA: Diagnosis not present

## 2022-02-14 DIAGNOSIS — R0602 Shortness of breath: Secondary | ICD-10-CM | POA: Diagnosis not present

## 2022-02-14 DIAGNOSIS — L501 Idiopathic urticaria: Secondary | ICD-10-CM | POA: Diagnosis not present

## 2022-02-17 ENCOUNTER — Ambulatory Visit (INDEPENDENT_AMBULATORY_CARE_PROVIDER_SITE_OTHER): Payer: Medicare PPO

## 2022-02-17 DIAGNOSIS — R0600 Dyspnea, unspecified: Secondary | ICD-10-CM | POA: Diagnosis not present

## 2022-02-17 DIAGNOSIS — R059 Cough, unspecified: Secondary | ICD-10-CM

## 2022-02-17 DIAGNOSIS — R0602 Shortness of breath: Secondary | ICD-10-CM | POA: Diagnosis not present

## 2022-03-08 ENCOUNTER — Ambulatory Visit (INDEPENDENT_AMBULATORY_CARE_PROVIDER_SITE_OTHER): Payer: Medicare PPO | Admitting: Emergency Medicine

## 2022-03-08 ENCOUNTER — Encounter: Payer: Self-pay | Admitting: Emergency Medicine

## 2022-03-08 DIAGNOSIS — R0602 Shortness of breath: Secondary | ICD-10-CM | POA: Diagnosis not present

## 2022-03-08 DIAGNOSIS — R918 Other nonspecific abnormal finding of lung field: Secondary | ICD-10-CM

## 2022-03-08 DIAGNOSIS — D3A090 Benign carcinoid tumor of the bronchus and lung: Secondary | ICD-10-CM | POA: Diagnosis not present

## 2022-03-08 MED ORDER — FLUTICASONE PROPIONATE 50 MCG/ACT NA SUSP
2.0000 | Freq: Every day | NASAL | 2 refills | Status: DC
Start: 2022-03-08 — End: 2022-06-08

## 2022-03-08 MED ORDER — ALBUTEROL SULFATE HFA 108 (90 BASE) MCG/ACT IN AERS: 2.0000 | INHALATION_SPRAY | Freq: Four times a day (QID) | RESPIRATORY_TRACT | 6 refills | Status: DC | PRN

## 2022-03-08 NOTE — Assessment & Plan Note (Signed)
Question whether her allergic disease is more active as her dyspnea is also associated with cough.  Will and fluticasone nasal spray, loratadine to her Xolair.  She needs repeat pulmonary function testing to assess for any progression of what looks like mild asthma.  Depending on PFTs we will consider starting ICS/LABA.  She also has an appointment arranged with cardiology in case this is noncardiac dyspnea.

## 2022-03-08 NOTE — Progress Notes (Signed)
Subjective:    Patient ID: Cynthia Rhodes, female    DOB: November 11, 1945, 76 y.o.   MRN: [id number]  HPI  ROV 01/25/21 --76 year old woman with a history of hypothyroidism and chronic allergies / hives on Xolair.  Also a right middle lobe carcinoid tumor that was surgically resected 12/2019.  Returns today for regular follow-up.  Grossly normal spirometry with possible curve to her flow-volume loop.  We did a trial of albuterol last year.  She reports that she is pretty active, does what she wants. She does feels some exertional SOB when walking dogs or in am when humidity. Minimal cough or wheeze. Uses albuterol about 2x a week. Not sure it helps her that much.  She is on xolair once a month, gets it at infusion center under obs. Reports no hives and controlled allergies.   CT chest 08/05/2020 reviewed by me shows stable findings post right lung resection.  Stable 3-4 mm bilateral pulmonary nodules compared with 04/24/2020. Planning for repeat this month.    ROV 03/08/2022 --Cynthia Rhodes is 76 with a history of chronic allergic disease/hives that has been treated with Xolair, surgical resection of right middle lobe carcinoid tumor with right upper lobe wedge resection 12/2019, mild asthma.  We have followed small 3-4 mm bilateral pulmonary nodular disease with serial imaging, last was 07/2021, stable as below planning for repeat CT in March 2024. Today she reports that she went off xolair for several months in late 2022 to see if she would tolerate. Her rash returned and she required low dose prednisone. She restarted the xolair about 6 months ago, had good response and is off prednisone.  She has begun to notice some exertional SOB beginning this summer, with yard work and with walking around the block. She does still exercise several days a week, notices some cough and dyspnea. Minimally productive. She uses nasal saline, has had some increased nasal congestion.   CT scan of the chest 08/06/2021 reviewed by me  shows stable changes post right middle lobe lobectomy and right upper lobe wedge resection, some right middle lobe medial scar and micronodular disease that is stable compared with priors.   Review of Systems As per HPI     Objective:   Physical Exam Vitals:   03/08/22 0918  BP: 135/74  Pulse: 74  SpO2: 95%  Weight: 165 lb 9.6 oz (75.1 kg)  Height: 5\' 6"  (1.676 m)   Gen: Pleasant, well-nourished, in no distress,  normal affect  ENT: No lesions,  mouth clear,  oropharynx clear, no postnasal drip  Neck: No JVD, no stridor  Lungs: No use of accessory muscles, no crackles or wheezing on normal respiration, no wheeze on forced expiration  Cardiovascular: RRR, heart sounds normal, no murmur or gallops, no peripheral edema  Musculoskeletal: No deformities, no cyanosis or clubbing  Neuro: alert, awake, non focal  Skin: Warm, no lesions or rash       Assessment & Plan:  Pulmonary nodules Stable on serial imaging.  Next scan is planned for 07/2022  Carcinoid tumor of right lung Following with Dr. [name] with serial films, next 07/2022  Dyspnea Question whether her allergic disease is more active as her dyspnea is also associated with cough.  Will and fluticasone nasal spray, loratadine to her Xolair.  She needs repeat pulmonary function testing to assess for any progression of what looks like mild asthma.  Depending on PFTs we will consider starting ICS/LABA.  She also has an appointment arranged with cardiology  in case this is noncardiac dyspnea.  Baltazar Apo, MD, PhD 03/08/2022, 9:43 AM [facility] [phone] or if no answer [phone]

## 2022-03-08 NOTE — Assessment & Plan Note (Signed)
Stable on serial imaging.  Next scan is planned for 07/2022

## 2022-03-08 NOTE — Patient Instructions (Signed)
We will arrange for repeat pulmonary function testing to compare with your priors. Keep your albuterol available to use 2 puffs up to every 4 hours if needed for shortness of breath, chest tightness, wheezing.  We will refill this for you today Start loratadine 10 mg once daily. Start fluticasone nasal spray, 2 sprays each nostril once daily. Continue Xolair shots as you have been getting them Follow with Dr. Lamonte Sakai next available after your pulmonary function testing so we can review the results together.

## 2022-03-08 NOTE — Assessment & Plan Note (Signed)
Following with Dr. [name] with serial films, next 07/2022

## 2022-03-13 DIAGNOSIS — F4323 Adjustment disorder with mixed anxiety and depressed mood: Secondary | ICD-10-CM | POA: Diagnosis not present

## 2022-03-14 DIAGNOSIS — L501 Idiopathic urticaria: Secondary | ICD-10-CM | POA: Diagnosis not present

## 2022-03-19 NOTE — Progress Notes (Unsigned)
Cardiology Office Note:    Date:  03/21/2022   ID:  Cynthia Rhodes, DOB [birth year], MRN [id number]  PCP:  [name], DO  [facility] Cardiologist:  Freada Bergeron, MD  [facility] Electrophysiologist:  None   Referring MD: [name], DO    History of Present Illness:    Cynthia Rhodes is a 76 y.o. 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  During our visit on 03/01/20, she was seen for incidental asymptomatic bradycardia. She was not on nodal agents at that time and we decided to continue to monitor.   Had admission to [facility] 04/2020 with left anterior chest pain radiating to left neck and shoulder x2 days after going to the chiropracter. She presented to the ED as the pain was severe 9/10. There,  Initial ECG with TWI in V1 and V2. Repeat showed TWI in V1. She had previous TWI in V1 (ECG 03/01/20 as well as prior), so overall not significantly changed. hsTnI normal at 5-->5. ESR normal, CRP elevated at 2.0. TSH normal. Tchol not at goal, LDL 124. A1c 6.0. TTE with normal BiV function. Thought to be noncardiac in nature as pain reproducible with palpation. She was recommended for symptomatic management and discharged home. Of note, she had very elevated blood pressure and was started on amlodipine prior to discharge  During last visit on 08/20/20, the patient was doing well. Bood pressures better controlled.  Was last seen in clinic on 10/2020 where she was doing well. BP controlled.   Today, ***  Past Medical History:  Diagnosis Date   Cancer ([place]) 2021   History of hiatal hernia    Hypothyroidism    Peripheral vascular disease ([place])    varicose veins    Past Surgical History:  Procedure Laterality Date   [place]   BRONCHIAL BIOPSY  11/04/2019    Procedure: BRONCHIAL BIOPSIES;  Surgeon: Collene Gobble, MD;  Location: [facility] ENDOSCOPY;  Service: Pulmonary;;   BRONCHIAL BRUSHINGS  11/04/2019   Procedure: BRONCHIAL BRUSHINGS;  Surgeon: Collene Gobble, MD;  Location[facility] ENDOSCOPY;  Service: Pulmonary;;   BRONCHIAL NEEDLE ASPIRATION BIOPSY  11/04/2019   Procedure: BRONCHIAL NEEDLE ASPIRATION BIOPSIES;  Surgeon: Collene Gobble, MD;  Location: [place];  Service: Pulmonary;;   BRONCHIAL WASHINGS  11/04/2019   Procedure: BRONCHIAL WASHINGS;  Surgeon: Collene Gobble, MD;  Location: [place] ENDOSCOPY;  Service: Pulmonary;;   BUNIONECTOMY Bilateral    [place]   EYE SURGERY Bilateral 2021   INTERCOSTAL NERVE BLOCK Right 01/15/2020   Procedure: INTERCOSTAL NERVE BLOCK;  Surgeon: Melrose Nakayama, MD;  Location: [place];  Service: Thoracic;  Laterality: Right;   JOINT REPLACEMENT Bilateral    [place]   KNEE SURGERY Bilateral    LYMPH NODE DISSECTION N/A 01/15/2020   Procedure: LYMPH NODE DISSECTION;  Surgeon: Melrose Nakayama, MD;  Location: [place];  Service: Thoracic;  Laterality: N/A;   REPLACEMENT TOTAL KNEE BILATERAL     SIGMOIDECTOMY     TONSILLECTOMY     VIDEO BRONCHOSCOPY WITH ENDOBRONCHIAL NAVIGATION N/A 11/04/2019   Procedure: VIDEO BRONCHOSCOPY WITH ENDOBRONCHIAL NAVIGATION;  Surgeon: Collene Gobble, MD;  Location: [facility] ENDOSCOPY;  Service: Pulmonary;  Laterality: N/A;   VIDEO BRONCHOSCOPY WITH ENDOBRONCHIAL NAVIGATION  N/A 01/15/2020   Procedure: VIDEO BRONCHOSCOPY WITH ENDOBRONCHIAL NAVIGATION to mark right upper lobe nodule;  Surgeon: Melrose Nakayama, MD;  Location: [facility] OR;  Service: Thoracic;  Laterality: N/A;    Current Medications: Current Meds  Medication Sig   acetaminophen (TYLENOL) 650 MG CR tablet Take 1,300 mg by mouth every 8 (eight) hours as needed for pain.   albuterol (VENTOLIN HFA) 108 (90 Base) MCG/ACT inhaler Inhale 2 puffs into the lungs every 6 (six) hours as needed for wheezing  or shortness of breath.   amLODipine (NORVASC) 10 MG tablet Take 10 mg by mouth daily.   B Complex Vitamins (B COMPLEX VITAMIN PO) Take 1 tablet by mouth daily.   Calcium Carb-Cholecalciferol (CALCIUM + D3 PO) Take 1 tablet by mouth daily.    cholecalciferol (VITAMIN D3) 25 MCG (1000 UNIT) tablet Take 1,000 Units by mouth daily.   denosumab (PROLIA) 60 MG/ML SOSY injection Inject 60 mg into the skin every 6 (six) months.   EPINEPHrine 0.3 mg/0.3 mL IJ SOAJ injection See admin instructions.   ezetimibe (ZETIA) 10 MG tablet Take 1 tablet (10 mg total) by mouth daily.   fluticasone (FLONASE) 50 MCG/ACT nasal spray Place 2 sprays into both nostrils daily.   levothyroxine (SYNTHROID) 50 MCG tablet Take 50 mcg by mouth every other day. Alternating with 75 mcg   levothyroxine (SYNTHROID) 75 MCG tablet Take 75 mcg by mouth every other day. Alternating with 50 mcg   magnesium oxide (MAG-OX) 400 MG tablet Take 400 mg by mouth daily.   Multiple Vitamins-Minerals (OCUVITE EYE HEALTH FORMULA PO) Take 1 tablet by mouth daily.   omalizumab Arvid Right) 150 MG/ML prefilled syringe Inject 150 mg into the skin every 28 (twenty-eight) days.   OVER THE COUNTER MEDICATION Take 6 capsules by mouth daily. Balance of Nature Fruits & Veggies supplement (3 Veggies + 3 Fruit)     Allergies:   Codeine, Pravastatin, Rosuvastatin, Sulfa antibiotics, Sulfacetamide sodium-sulfur, and Voltaren [diclofenac sodium]   Social History   Socioeconomic History   Marital status: Widowed    Spouse name: Not on file   Number of children: Not on file   Years of education: Not on file   Highest education level: Not on file  Occupational History   Not on file  Tobacco Use   Smoking status: Never   Smokeless tobacco: Never  Vaping Use   Vaping Use: Never used  Substance and Sexual Activity   Alcohol use: Yes    Alcohol/week: 7.0 standard drinks of alcohol    Types: 7 Glasses of wine per week   Drug use: Never   Sexual activity:  Not Currently  Other Topics Concern   Not on file  Social History Narrative   Not on file   Social Determinants of Health   Financial Resource Strain: Not on file  Food Insecurity: No Food Insecurity (02/13/2022)   Hunger Vital Sign    Worried About Running Out of Food in the Last Year: Never true    Ran Out of Food in the Last Year: Never true  Transportation Needs: No Transportation Needs (02/13/2022)   PRAPARE - Hydrologist (Medical): No    Lack of Transportation (Non-Medical): No  Physical Activity: Not on file  Stress: Not on file  Social Connections: Not on file     Family History: The patient's family history includes Asthma in her mother; Breast cancer in her mother.  ROS:   Please see the history  of present illness.    Review of Systems  Constitutional:  Negative for chills and fever.  HENT:  Negative for hearing loss.   Eyes:  Negative for blurred vision.  Respiratory:  Negative for shortness of breath.   Cardiovascular:  Negative for chest pain, palpitations, orthopnea, claudication, leg swelling and PND.  Gastrointestinal:  Negative for constipation, nausea and vomiting.  Genitourinary:  Negative for hematuria.  Musculoskeletal:  Negative for back pain and myalgias.  Neurological:  Negative for dizziness and loss of consciousness.  Endo/Heme/Allergies:  Negative for polydipsia.  Psychiatric/Behavioral:  Negative for depression.     EKGs/Labs/Other Studies Reviewed:    The following studies were reviewed today: CT chest 09/2019: IMPRESSION: 1. 2.8 x 1.6 cm spiculated mass with pleural tail is noted in the right middle lobe consistent with primary malignancy. PET scan is recommended for further evaluation. These results will be called to the ordering clinician or representative by the Radiologist Assistant, and communication documented in the PACS or zVision Dashboard. 2. 7 x 5 mm nodule is noted in right upper lobe concerning  for metastatic lesion. 3. Mild cardiomegaly.   ECG 01/13/20: Sinus braydcardia; no block.  Echo 04/25/20 1. Left ventricular ejection fraction, by estimation, is 65 to 70%. The  left ventricle has normal function. The left ventricle has no regional  wall motion abnormalities. Indeterminate diastolic filling due to E-A  fusion.   2. Right ventricular systolic function is normal. The right ventricular  size is normal. Tricuspid regurgitation signal is inadequate for assessing  PA pressure.   3. The mitral valve is normal in structure. No evidence of mitral valve  regurgitation. No evidence of mitral stenosis. Moderate mitral annular  calcification.   4. The aortic valve is grossly normal. There is mild calcification of the  aortic valve. There is mild thickening of the aortic valve. Aortic valve  regurgitation is not visualized. Mild aortic valve sclerosis is present,  with no evidence of aortic valve  stenosis.   5. Pulmonic valve regurgitation not visualized.   6. The inferior vena cava is normal in size with greater than 50%  respiratory variability, suggesting right atrial pressure of 3 mmHg.   Comparison(s): No prior Echocardiogram  CTA chest 04/24/20: IMPRESSION: 1. Negative for pulmonary embolism. 2. Postsurgical changes in right lung with a small amount of new fluid or low-density in the right hilar region adjacent to the right middle lobectomy site. This fluid or low-density is nonspecific but appears to be new since 03/31/2020. Consider cardiothoracic consultation with regards to this finding. 3. No acute disease in the lungs. Stable small pulmonary nodules. Recommend attention to the small nodules on follow-up imaging.  EKG:  EKG is 04/26/20.  The ekg ordered today demonstrates NSR with TWI in V1 (stable)  Recent Labs: 08/06/2021: Creatinine, Ser 0.80  Recent Lipid Panel    Component Value Date/Time   CHOL 208 (H) 01/10/2021 1002   TRIG 106 01/10/2021 1002   HDL  70 01/10/2021 1002   CHOLHDL 3.0 01/10/2021 1002   CHOLHDL 3.1 04/24/2020 2311   VLDL 15 04/24/2020 2311   LDLCALC 119 (H) 01/10/2021 1002      Physical Exam:    VS:  BP 124/74   Pulse 61   Ht _0  (1.676 m)   Wt 166 lb 13.6 oz (75.7 kg)   SpO2 99%   BMI 26.93 kg/m     Wt Readings from Last 3 Encounters:  03/21/22 166 lb 13.6 oz (75.7 kg)  03/08/22 165 lb 9.6 oz (75.1 kg)  08/08/21 161 lb 3 oz (73.1 kg)     GEN:  Well nourished, well developed in no acute distress HEENT: Normal NECK: No JVD; No carotid bruits CARDIAC: RRR, no murmurs, rubs, gallops RESPIRATORY:  Clear to auscultation without rales, wheezing or rhonchi  ABDOMEN: Soft, non-tender, non-distended MUSCULOSKELETAL:  Trace bilateral pedal edema, warm; No deformity  SKIN: Warm and dry NEUROLOGIC:  Alert and oriented x 3 PSYCHIATRIC:  Normal affect   ASSESSMENT:    1. Shortness of breath   2. Dyslipidemia   3. Bradycardia   4. Primary hypertension    PLAN:    In order of problems listed above:  #Noncardiac Chest Pain: Developed after having work done by her chiropractor. Presented to [facility] ED with neck and shoulder pain. Cardiac work-up including trops, ECG and TTE unremarkable. Pain thought to be MSK now improving. -Continue symptomatic management as not cardiac in nature -TTE with normal BiV function, no significant valvular abnormalities -ECG without ischemia -Trop negative  #Asymptomatic Sinus bradycardia: Patient with asymptomatic bradycardia with HR 50-60s. No evidence of conduction disease on ECG. Denies chest pain, lightheadedness, dizziness, SOB or syncope. Able to exert herself without symptoms. Very active at baseline. Not on nodal agents or herbal supplements. -Reassuringly asymptomatic without evidence of block -Not on nodal agents -TSH normal on current synthroid dosing -Will continue to monitor but no intervention needed from CV standpoint   #Hypertension *** -Continue amlodipine  33m daily  #HLD: *** -Continue zetia 176mdaily   #RUL and RML carcinoid: S/p resection in 12/2019. -Follows with Dr. MoJulien Nordmannnd Dr. ByLamonte Sakai Medication Adjustments/Labs and Tests Ordered: Current medicines are reviewed at length with the patient today.  Concerns regarding medicines are outlined above.  No orders of the defined types were placed in this encounter.  No orders of the defined types were placed in this encounter.   There are no Patient Instructions on file for this visit.     I,Alexis Bryant,acting as a scEducation administratoror HeFreada BergeronMD.,have documented all relevant documentation on the behalf of HeFreada BergeronMD,as directed by  HeFreada BergeronMD while in the presence of HeFreada BergeronMD.  I, HeFreada BergeronMD, have reviewed all documentation for this visit. The documentation on 03/21/22 for the exam, diagnosis, procedures, and orders are all accurate and complete. Signed, HeFreada BergeronMD  03/21/2022 1:48 PM    [place]

## 2022-03-21 ENCOUNTER — Ambulatory Visit: Payer: Medicare PPO | Attending: Cardiology

## 2022-03-21 ENCOUNTER — Ambulatory Visit: Payer: Medicare PPO | Attending: Cardiology | Admitting: Cardiology

## 2022-03-21 ENCOUNTER — Encounter: Payer: Self-pay | Admitting: Cardiology

## 2022-03-21 ENCOUNTER — Telehealth: Payer: Self-pay | Admitting: *Deleted

## 2022-03-21 VITALS — BP 124/74 | HR 61 | Ht 66.0 in | Wt 166.8 lb

## 2022-03-21 DIAGNOSIS — R001 Bradycardia, unspecified: Secondary | ICD-10-CM

## 2022-03-21 DIAGNOSIS — R072 Precordial pain: Secondary | ICD-10-CM | POA: Diagnosis not present

## 2022-03-21 DIAGNOSIS — R0602 Shortness of breath: Secondary | ICD-10-CM | POA: Diagnosis not present

## 2022-03-21 DIAGNOSIS — R002 Palpitations: Secondary | ICD-10-CM

## 2022-03-21 DIAGNOSIS — I10 Essential (primary) hypertension: Secondary | ICD-10-CM | POA: Diagnosis not present

## 2022-03-21 DIAGNOSIS — E785 Hyperlipidemia, unspecified: Secondary | ICD-10-CM

## 2022-03-21 MED ORDER — METOPROLOL TARTRATE 25 MG PO TABS: 25.0000 mg | ORAL_TABLET | Freq: Once | ORAL | 0 refills | Status: DC

## 2022-03-21 NOTE — Patient Instructions (Signed)
Medication Instructions:   Your physician recommends that you continue on your current medications as directed. Please refer to the Current Medication list given to you today.  *If you need a refill on your cardiac medications before your next appointment, please call your pharmacy*   Lab Work:  TOMORROW 03/22/22--BMET AND LIPIDS--PLEASE COME FASTING TO THIS LAB APPOINTMENT  If you have labs (blood work) drawn today and your tests are completely normal, you will receive your results only by: [place] (if you have MyChart) OR A paper copy in the mail If you have any lab test that is abnormal or we need to change your treatment, we will call you to review the results.   Testing/Procedures:    Your cardiac CT will be scheduled at one of the below locations:   [facility] [address] [place], [place] [address] [phone]   If scheduled at [facility], please arrive at the [facility] and Children's Entrance (Entrance C2) of [facility] 30 minutes prior to test start time. You can use the FREE valet parking offered at entrance C (encouraged to control the heart rate for the test)  Proceed to the [facility] Radiology Department (first floor) to check-in and test prep.  All radiology patients and guests should use entrance C2 at [facility], accessed from [facility], even though the hospital's physical address listed is [address]     Please follow these instructions carefully (unless otherwise directed):   On the Night Before the Test: Be sure to Drink plenty of water. Do not consume any caffeinated/decaffeinated beverages or chocolate 12 hours prior to your test. Do not take any antihistamines 12 hours prior to your test.   On the Day of the Test: Drink plenty of water until 1 hour prior to the test. Do not eat any food 1 hour prior to test. You may take your regular medications prior to the test.   Take metoprolol 25 MG BY MOUTH (Lopressor) two hours prior to test. FEMALES- please wear underwire-free bra if available, avoid dresses & tight clothing      After the Test: Drink plenty of water. After receiving IV contrast, you may experience a mild flushed feeling. This is normal. On occasion, you may experience a mild rash up to 24 hours after the test. This is not dangerous. If this occurs, you can take Benadryl 25 mg and increase your fluid intake. If you experience trouble breathing, this can be serious. If it is severe call 911 IMMEDIATELY. If it is mild, please call our office.   We will call to schedule your test 2-4 weeks out understanding that some insurance companies will need an authorization prior to the service being performed.   For non-scheduling related questions, please contact the cardiac imaging nurse navigator should you have any questions/concerns: Marchia Bond, Cardiac Imaging Nurse Navigator Gordy Clement, Cardiac Imaging Nurse Navigator [facility] Direct Office Dial: [phone]   For scheduling needs, including cancellations and rescheduling, please call [place], [phone].   ZIO XT- Long Term Monitor Instructions  Your physician has requested you wear a ZIO patch monitor for 3 days.  This is a single patch monitor. [employer] supplies one patch monitor per enrollment. Additional stickers are not available. Please do not apply patch if you will be having a Nuclear Stress Test,  Echocardiogram, Cardiac CT, MRI, or Chest Xray during the period you would be wearing the  monitor. The  patch cannot be worn during these tests. You cannot remove and re-apply the  ZIO XT patch monitor.  Your ZIO patch monitor will be mailed 3 day USPS to your address on file. It may take 3-5 days  to receive your monitor after you have been enrolled.  Once you have received your monitor, please review the enclosed instructions. Your monitor  has  already been registered assigning a specific monitor serial # to you.  Billing and Patient Assistance Program Information  We have supplied [employer] with any of your insurance information on file for billing purposes. [employer] offers a sliding scale Patient Assistance Program for patients that do not have  insurance, or whose insurance does not completely cover the cost of the ZIO monitor.  You must apply for the Patient Assistance Program to qualify for this discounted rate.  To apply, please call [employer] at [phone], select option 4, select option 2, ask to apply for  Patient Assistance Program. Theodore Demark will ask your household income, and how many people  are in your household. They will quote your out-of-pocket cost based on that information.  [employer] will also be able to set up a 59-month, interest-free payment plan if needed.  Applying the monitor   Shave hair from upper left chest.  Hold abrader disc by orange tab. Rub abrader in 40 strokes over the upper left chest as  indicated in your monitor instructions.  Clean area with 4 enclosed alcohol pads. Let dry.  Apply patch as indicated in monitor instructions. Patch will be placed under collarbone on left  side of chest with arrow pointing upward.  Rub patch adhesive wings for 2 minutes. Remove white label marked "1". Remove the white  label marked "2". Rub patch adhesive wings for 2 additional minutes.  While looking in a mirror, press and release button in center of patch. A small green light will  flash 3-4 times. This will be your only indicator that the monitor has been turned on.  Do not shower for the first 24 hours. You may shower after the first 24 hours.  Press the button if you feel a symptom. You will hear a small click. Record Date, Time and  Symptom in the Patient Logbook.  When you are ready to remove the patch, follow instructions on the last 2 pages of Patient  Logbook. Stick patch monitor onto the last page of  Patient Logbook.  Place Patient Logbook in the blue and white box. Use locking tab on box and tape box closed  securely. The blue and white box has prepaid postage on it. Please place it in the mailbox as  soon as possible. Your physician should have your test results approximately 7 days after the  monitor has been mailed back to [facility].  Call [place] at [phone] if you have questions regarding  your ZIO XT patch monitor. Call them immediately if you see an orange light blinking on your  monitor.  If your monitor falls off in less than 4 days, contact our Monitor department at [phone].  If your monitor becomes loose or falls off after 4 days call [employer] at [phone] for  suggestions on securing your monitor    Follow-Up: At [facility], you and your health needs are our priority.  As part of our continuing mission to provide you with exceptional heart care, we have created designated Provider Care Teams.  These Care Teams include your primary Cardiologist (physician) and Advanced Practice Providers (APPs -  Physician Assistants and Nurse Practitioners) who all work together to provide you with the care you need, when you need it.  We recommend signing up for the patient portal called "MyChart".  Sign up information is provided on this After Visit Summary.  MyChart is used to connect with patients for Virtual Visits (Telemedicine).  Patients are able to view lab/test results, encounter notes, upcoming appointments, etc.  Non-urgent messages can be sent to your provider as well.   To learn more about what you can do with MyChart, go to [email or web address].    Your next appointment:   6 month(s)  The format for your next appointment:   In Person  Provider:   Freada Bergeron, MD      Important Information About Sugar

## 2022-03-21 NOTE — Progress Notes (Signed)
Cardiology Office Note:    Date:  03/21/2022   ID:  Cynthia Rhodes, DOB 17-Mar-1946, MRN [id number]  PCP:  [name], DO  [facility] Cardiologist:  Freada Bergeron, MD  [facility] Electrophysiologist:  None   Referring MD: [name], DO    History of Present Illness:    Cynthia Rhodes is a 76 y.o. 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  During our visit on 03/01/20, she was seen for incidental asymptomatic bradycardia. She was not on nodal agents at that time and we decided to continue to monitor.   Had admission to [facility] 04/2020 with left anterior chest pain radiating to left neck and shoulder x2 days after going to the chiropracter. She presented to the ED as the pain was severe 9/10. There,  Initial ECG with TWI in V1 and V2. Repeat showed TWI in V1. She had previous TWI in V1 (ECG 03/01/20 as well as prior), so overall not significantly changed. hsTnI normal at 5-->5. ESR normal, CRP elevated at 2.0. TSH normal. Tchol not at goal, LDL 124. A1c 6.0. TTE with normal BiV function. Thought to be noncardiac in nature as pain reproducible with palpation. She was recommended for symptomatic management and discharged home. Of note, she had very elevated blood pressure and was started on amlodipine prior to discharge  During last visit on 08/20/20, the patient was doing well. Bood pressures better controlled.  Was last seen in clinic on 10/2020 where she was doing well. BP controlled.   Today, the patient states that she has been doing ok. She has noticed that her heart is "all over the place" with moderate exercise such as walking a quarter of a mile. Specifically, she gets short of breath after around 5 minutes of activity which is very abnormal for her. She has no associated chest pain, palpitations or  syncopal symptoms. She sits down to rest and takes her blood pressure to check what is going on and her blood pressure is usually within normal range but her "heart rate jumps all over the place." She states this is a clear change from several months ago where she was active without issues. She has seen Dr. Lamonte Sakai as well and is planned for further testing.  She denies any chest pain, or peripheral edema. No headaches, orthopnea, or PND.  Past Medical History:  Diagnosis Date   Cancer ([place]) 2021   History of hiatal hernia    Hypothyroidism    Peripheral vascular disease ([place])    varicose veins    Past Surgical History:  Procedure Laterality Date   [place]   BRONCHIAL BIOPSY  11/04/2019   Procedure: BRONCHIAL BIOPSIES;  Surgeon: Collene Gobble, MD;  Location: [facility] ENDOSCOPY;  Service: Pulmonary;;   BRONCHIAL BRUSHINGS  11/04/2019   Procedure: BRONCHIAL BRUSHINGS;  Surgeon: Collene Gobble, MD;  Location: [facility] ENDOSCOPY;  Service: Pulmonary;;   BRONCHIAL NEEDLE ASPIRATION BIOPSY  11/04/2019   Procedure: BRONCHIAL NEEDLE ASPIRATION BIOPSIES;  Surgeon: Collene Gobble, MD;  Location: [place];  Service: Pulmonary;;   BRONCHIAL WASHINGS  11/04/2019   Procedure: BRONCHIAL WASHINGS;  Surgeon: Collene Gobble, MD;  Location: [place] ENDOSCOPY;  Service: Pulmonary;;   BUNIONECTOMY Bilateral    [place]  EYE SURGERY Bilateral 2021   INTERCOSTAL NERVE BLOCK Right 01/15/2020   Procedure: INTERCOSTAL NERVE BLOCK;  Surgeon: Melrose Nakayama, MD;  Location: [place];  Service: Thoracic;  Laterality: Right;   JOINT REPLACEMENT Bilateral    Knee   KNEE SURGERY Bilateral    LYMPH NODE DISSECTION N/A 01/15/2020   Procedure: LYMPH NODE DISSECTION;  Surgeon: Melrose Nakayama, MD;  Location: [place];  Service: Thoracic;  Laterality: N/A;   REPLACEMENT TOTAL KNEE BILATERAL     SIGMOIDECTOMY     TONSILLECTOMY     VIDEO BRONCHOSCOPY WITH  ENDOBRONCHIAL NAVIGATION N/A 11/04/2019   Procedure: VIDEO BRONCHOSCOPY WITH ENDOBRONCHIAL NAVIGATION;  Surgeon: Collene Gobble, MD;  Location: [facility] ENDOSCOPY;  Service: Pulmonary;  Laterality: N/A;   VIDEO BRONCHOSCOPY WITH ENDOBRONCHIAL NAVIGATION N/A 01/15/2020   Procedure: VIDEO BRONCHOSCOPY WITH ENDOBRONCHIAL NAVIGATION to mark right upper lobe nodule;  Surgeon: Melrose Nakayama, MD;  Location: [facility] OR;  Service: Thoracic;  Laterality: N/A;    Current Medications: Current Meds  Medication Sig   acetaminophen (TYLENOL) 650 MG CR tablet Take 1,300 mg by mouth every 8 (eight) hours as needed for pain.   albuterol (VENTOLIN HFA) 108 (90 Base) MCG/ACT inhaler Inhale 2 puffs into the lungs every 6 (six) hours as needed for wheezing or shortness of breath.   amLODipine (NORVASC) 10 MG tablet Take 10 mg by mouth daily.   B Complex Vitamins (B COMPLEX VITAMIN PO) Take 1 tablet by mouth daily.   Calcium Carb-Cholecalciferol (CALCIUM + D3 PO) Take 1 tablet by mouth daily.    cholecalciferol (VITAMIN D3) 25 MCG (1000 UNIT) tablet Take 1,000 Units by mouth daily.   denosumab (PROLIA) 60 MG/ML SOSY injection Inject 60 mg into the skin every 6 (six) months.   EPINEPHrine 0.3 mg/0.3 mL IJ SOAJ injection See admin instructions.   ezetimibe (ZETIA) 10 MG tablet Take 1 tablet (10 mg total) by mouth daily.   fluticasone (FLONASE) 50 MCG/ACT nasal spray Place 2 sprays into both nostrils daily.   levothyroxine (SYNTHROID) 50 MCG tablet Take 50 mcg by mouth every other day. Alternating with 75 mcg   levothyroxine (SYNTHROID) 75 MCG tablet Take 75 mcg by mouth every other day. Alternating with 50 mcg   magnesium oxide (MAG-OX) 400 MG tablet Take 400 mg by mouth daily.   metoprolol tartrate (LOPRESSOR) 25 MG tablet Take 1 tablet (25 mg total) by mouth once for 1 dose. Take 90-120 minutes prior to scan.   Multiple Vitamins-Minerals (OCUVITE EYE HEALTH FORMULA PO) Take 1 tablet by mouth daily.   omalizumab  Arvid Right) 150 MG/ML prefilled syringe Inject 150 mg into the skin every 28 (twenty-eight) days.   OVER THE COUNTER MEDICATION Take 6 capsules by mouth daily. Balance of Nature Fruits & Veggies supplement (3 Veggies + 3 Fruit)     Allergies:   Codeine, Pravastatin, Rosuvastatin, Sulfa antibiotics, Sulfacetamide sodium-sulfur, and Voltaren [diclofenac sodium]   Social History   Socioeconomic History   Marital status: Widowed    Spouse name: Not on file   Number of children: Not on file   Years of education: Not on file   Highest education level: Not on file  Occupational History   Not on file  Tobacco Use   Smoking status: Never   Smokeless tobacco: Never  Vaping Use   Vaping Use: Never used  Substance and Sexual Activity   Alcohol use: Yes    Alcohol/week: 7.0 standard drinks of alcohol    Types: 7  Glasses of wine per week   Drug use: Never   Sexual activity: Not Currently  Other Topics Concern   Not on file  Social History Narrative   Not on file   Social Determinants of Health   Financial Resource Strain: Not on file  Food Insecurity: No Food Insecurity (02/13/2022)   Hunger Vital Sign    Worried About Running Out of Food in the Last Year: Never true    Ran Out of Food in the Last Year: Never true  Transportation Needs: No Transportation Needs (02/13/2022)   PRAPARE - Hydrologist (Medical): No    Lack of Transportation (Non-Medical): No  Physical Activity: Not on file  Stress: Not on file  Social Connections: Not on file     Family History: The patient's family history includes Asthma in her mother; Breast cancer in her mother.  ROS:   Please see the history of present illness.    Review of Systems  Constitutional:  Negative for chills and fever.  HENT:  Negative for hearing loss.   Eyes:  Negative for blurred vision.  Respiratory:  Positive for shortness of breath.   Cardiovascular:  Negative for chest pain, orthopnea,  claudication, leg swelling and PND.  Gastrointestinal:  Negative for constipation, nausea and vomiting.  Genitourinary:  Negative for hematuria.  Musculoskeletal:  Negative for back pain and myalgias.  Neurological:  Negative for dizziness and loss of consciousness.  Endo/Heme/Allergies:  Negative for polydipsia.  Psychiatric/Behavioral:  Negative for depression.    EKGs/Labs/Other Studies Reviewed:    The following studies were reviewed today: CT chest 09/2019: IMPRESSION: 1. 2.8 x 1.6 cm spiculated mass with pleural tail is noted in the right middle lobe consistent with primary malignancy. PET scan is recommended for further evaluation. These results will be called to the ordering clinician or representative by the Radiologist Assistant, and communication documented in the PACS or zVision Dashboard. 2. 7 x 5 mm nodule is noted in right upper lobe concerning for metastatic lesion. 3. Mild cardiomegaly.   ECG 01/13/20: Sinus braydcardia; no block.  Echo 04/25/20 1. Left ventricular ejection fraction, by estimation, is 65 to 70%. The  left ventricle has normal function. The left ventricle has no regional  wall motion abnormalities. Indeterminate diastolic filling due to E-A  fusion.   2. Right ventricular systolic function is normal. The right ventricular  size is normal. Tricuspid regurgitation signal is inadequate for assessing  PA pressure.   3. The mitral valve is normal in structure. No evidence of mitral valve  regurgitation. No evidence of mitral stenosis. Moderate mitral annular  calcification.   4. The aortic valve is grossly normal. There is mild calcification of the  aortic valve. There is mild thickening of the aortic valve. Aortic valve  regurgitation is not visualized. Mild aortic valve sclerosis is present,  with no evidence of aortic valve  stenosis.   5. Pulmonic valve regurgitation not visualized.   6. The inferior vena cava is normal in size with greater  than 50%  respiratory variability, suggesting right atrial pressure of 3 mmHg.   Comparison(s): No prior Echocardiogram  CTA chest 04/24/20: IMPRESSION: 1. Negative for pulmonary embolism. 2. Postsurgical changes in right lung with a small amount of new fluid or low-density in the right hilar region adjacent to the right middle lobectomy site. This fluid or low-density is nonspecific but appears to be new since 03/31/2020. Consider cardiothoracic consultation with regards to this finding. 3. No  acute disease in the lungs. Stable small pulmonary nodules. Recommend attention to the small nodules on follow-up imaging.  EKG:  EKG is personally reviewed  03/21/22: NSR 61 bpm 04/26/20: NSR with TWI in V1 (stable)  Recent Labs: 08/06/2021: Creatinine, Ser 0.80  Recent Lipid Panel    Component Value Date/Time   CHOL 208 (H) 01/10/2021 1002   TRIG 106 01/10/2021 1002   HDL 70 01/10/2021 1002   CHOLHDL 3.0 01/10/2021 1002   CHOLHDL 3.1 04/24/2020 2311   VLDL 15 04/24/2020 2311   LDLCALC 119 (H) 01/10/2021 1002      Physical Exam:    VS:  BP 124/74   Pulse 61   Ht _0  (1.676 m)   Wt 166 lb 13.6 oz (75.7 kg)   SpO2 99%   BMI 26.93 kg/m     Wt Readings from Last 3 Encounters:  03/21/22 166 lb 13.6 oz (75.7 kg)  03/08/22 165 lb 9.6 oz (75.1 kg)  08/08/21 161 lb 3 oz (73.1 kg)     GEN:   Well nourished, well developed in no acute distress HEENT: Normal NECK: No JVD; No carotid bruits CARDIAC:  RRR, no murmurs, rubs, gallops RESPIRATORY:  Clear to auscultation without rales, wheezing or rhonchi  ABDOMEN: Soft, non-tender, non-distended MUSCULOSKELETAL:  No edema, warm SKIN: Warm and dry NEUROLOGIC:  Alert and oriented x 3 PSYCHIATRIC:  Normal affect   ASSESSMENT:    1. Shortness of breath   2. Dyslipidemia   3. Bradycardia   4. Primary hypertension   5. Precordial pain   6. Palpitations    PLAN:    In order of problems listed above:  #DOE: Patient has  developed worsening SOB over the past several months which is new for her. TTE 2021 with LVEF 65-70%, no significant valve disease. No associated chest pain, orthopnea, PND, lightheadedness or dizziness. Denies palpitations but is having irregular pulse when she checks her blood pressure. Will check cardiac CTA and zio for further evaluation. She is also seeing pulm for further work-up. -Check coronary CTA -TTE 2021 with normal BiV function and no significant valve disease -Check 3 day zio due to erratic pulse during episodes -Has follow-up with pulm  #Asymptomatic Sinus bradycardia: No lightheadedness, dizziness, or syncope. No nodal agents.  -Reassuringly asymptomatic without evidence of block -Not on nodal agents -TSH normal on current synthroid dosing   #Hypertension Controlled at home mainly 120-130s.  -Continue amlodipine 19m daily  #HLD: Intolerant of statins. Checking CTA as above and will adjust LDL goal as needed. -Continue zetia 122mdaily   #RUL and RML carcinoid: S/p resection in 12/2019. -Follows with Dr. MoJulien Nordmannnd Dr. ByLamonte SakaiFollow Up: 6 months.  Medication Adjustments/Labs and Tests Ordered: Current medicines are reviewed at length with the patient today.  Concerns regarding medicines are outlined above.  Orders Placed This Encounter  Procedures   CT CORONARY MORPH W/CTA COR W/SCORE W/CA W/CM &/OR WO/CM   Basic metabolic panel   Lipid Profile   LONG TERM MONITOR (3-14 DAYS)   EKG 12-Lead   Meds ordered this encounter  Medications   metoprolol tartrate (LOPRESSOR) 25 MG tablet    Sig: Take 1 tablet (25 mg total) by mouth once for 1 dose. Take 90-120 minutes prior to scan.    Dispense:  1 tablet    Refill:  0   Patient Instructions  Medication Instructions:   Your physician recommends that you continue on your current medications as directed. Please refer to  the Current Medication list given to you today.  *If you need a refill on your cardiac  medications before your next appointment, please call your pharmacy*   Lab Work:  TOMORROW 03/22/22--BMET AND LIPIDS--PLEASE COME FASTING TO THIS LAB APPOINTMENT  If you have labs (blood work) drawn today and your tests are completely normal, you will receive your results only by: [place] (if you have MyChart) OR A paper copy in the mail If you have any lab test that is abnormal or we need to change your treatment, we will call you to review the results.   Testing/Procedures:    Your cardiac CT will be scheduled at one of the below locations:   [facility] [address] [place], [place] [address] [phone]   If scheduled at [facility], please arrive at the [facility]'s Entrance (Entrance C2) of [facility] 30 minutes prior to test start time. You can use the FREE valet parking offered at entrance C (encouraged to control the heart rate for the test)  Proceed to the [facility] Radiology Department (first floor) to check-in and test prep.  All radiology patients and guests should use entrance C2 at [facility], accessed from [facility], even though the hospital's physical address listed is [address]     Please follow these instructions carefully (unless otherwise directed):   On the Night Before the Test: Be sure to Drink plenty of water. Do not consume any caffeinated/decaffeinated beverages or chocolate 12 hours prior to your test. Do not take any antihistamines 12 hours prior to your test.   On the Day of the Test: Drink plenty of water until 1 hour prior to the test. Do not eat any food 1 hour prior to test. You may take your regular medications prior to the test.  Take metoprolol 25 MG BY MOUTH (Lopressor) two hours prior to test. FEMALES- please wear underwire-free bra if available, avoid dresses & tight clothing      After the Test: Drink plenty of water. After receiving IV  contrast, you may experience a mild flushed feeling. This is normal. On occasion, you may experience a mild rash up to 24 hours after the test. This is not dangerous. If this occurs, you can take Benadryl 25 mg and increase your fluid intake. If you experience trouble breathing, this can be serious. If it is severe call 911 IMMEDIATELY. If it is mild, please call our office.   We will call to schedule your test 2-4 weeks out understanding that some insurance companies will need an authorization prior to the service being performed.   For non-scheduling related questions, please contact the cardiac imaging nurse navigator should you have any questions/concerns: Marchia Bond, Cardiac Imaging Nurse Navigator Gordy Clement, Cardiac Imaging Nurse Navigator [facility] Direct Office Dial: [phone]   For scheduling needs, including cancellations and rescheduling, please call [place], [phone].   ZIO XT- Long Term Monitor Instructions  Your physician has requested you wear a ZIO patch monitor for 3 days.  This is a single patch monitor. [employer] supplies one patch monitor per enrollment. Additional stickers are not available. Please do not apply patch if you will be having a Nuclear Stress Test,  Echocardiogram, Cardiac CT, MRI, or Chest Xray during the period you would be wearing the  monitor. The patch cannot be worn during these tests. You cannot remove and re-apply the  ZIO XT patch monitor.  Your ZIO patch monitor will be mailed 3 day USPS to your address on file. It may take 3-5 days  to receive your monitor after you have been enrolled.  Once you have received your monitor, please review the enclosed instructions. Your monitor  has already been registered assigning a specific monitor serial # to you.  Billing and Patient Assistance Program Information  We have supplied [employer] with any of your insurance information on file for billing  purposes. [employer] offers a sliding scale Patient Assistance Program for patients that do not have  insurance, or whose insurance does not completely cover the cost of the ZIO monitor.  You must apply for the Patient Assistance Program to qualify for this discounted rate.  To apply, please call [employer] at [phone], select option 4, select option 2, ask to apply for  Patient Assistance Program. Theodore Demark will ask your household income, and how many people  are in your household. They will quote your out-of-pocket cost based on that information.  [employer] will also be able to set up a 54-month interest-free payment plan if needed.  Applying the monitor   Shave hair from upper left chest.  Hold abrader disc by orange tab. Rub abrader in 40 strokes over the upper left chest as  indicated in your monitor instructions.  Clean area with 4 enclosed alcohol pads. Let dry.  Apply patch as indicated in monitor instructions. Patch will be placed under collarbone on left  side of chest with arrow pointing upward.  Rub patch adhesive wings for 2 minutes. Remove white label marked "1". Remove the white  label marked "2". Rub patch adhesive wings for 2 additional minutes.  While looking in a mirror, press and release button in center of patch. A small green light will  flash 3-4 times. This will be your only indicator that the monitor has been turned on.  Do not shower for the first 24 hours. You may shower after the first 24 hours.  Press the button if you feel a symptom. You will hear a small click. Record Date, Time and  Symptom in the Patient Logbook.  When you are ready to remove the patch, follow instructions on the last 2 pages of Patient  Logbook. Stick patch monitor onto the last page of Patient Logbook.  Place Patient Logbook in the blue and white box. Use locking tab on box and tape box closed  securely. The blue and white box has prepaid postage on it. Please place it in the mailbox as  soon  as possible. Your physician should have your test results approximately 7 days after the  monitor has [facility]  Call [place]at [phone] you have questions regarding  your ZIO XT patch monitor. Call them immediately if you see an orange light blinking on your  monitor.  If your monitor falls off in less than 4 days, contact our Monitor department at [phone]  If your monitor becomes loose or falls off after 4 days call [employer] at [phone]  suggestions on securing your monitor    Follow-Up: [facility] and your health needs are our priority.  As part of our continuing mission to provide you with exceptional heart care, we have created designated Provider Care Teams.  These Care Teams include your primary Cardiologist (physician) and Advanced Practice Providers (APPs -  Physician Assistants and Nurse Practitioners) who all work together to provide you with the care you need,  when you need it.  We recommend signing up for the patient portal called "MyChart".  Sign up information is provided on this After Visit Summary.  MyChart is used to connect with patients for Virtual Visits (Telemedicine).  Patients are able to view lab/test results, encounter notes, upcoming appointments, etc.  Non-urgent messages can be sent to your provider as well.   To learn more about what you can do with MyChart, go to [email or web address].    Your next appointment:   6 month(s)  The format for your next appointment:   In Person  Provider:   Freada Bergeron, MD      Important Information About Sugar        I,Coren O'Brien,acting as a scribe for Freada Bergeron, MD.,have documented all relevant documentation on the behalf of Freada Bergeron, MD,as directed by  Freada Bergeron, MD while in the presence of Freada Bergeron, MD.  I, Freada Bergeron, MD, have reviewed all documentation for this  visit. The documentation on 03/21/22 for the exam, diagnosis, procedures, and orders are all accurate and complete.   Signed, Freada Bergeron, MD  03/21/2022 2:22 PM    [place]

## 2022-03-21 NOTE — Telephone Encounter (Signed)
-----   Message from Jennefer Bravo sent at 03/21/2022  2:55 PM EDT ----- Regarding: RE: 3 day zio per Dr. Johney Frame done ----- Message ----- From: Nuala Alpha, LPN Sent: [id number]/02/2584   2:06 PM EDT To: Jennefer Bravo; Gerarda Gunther Subject: 3 day zio per Dr. Johney Frame                    3 day zio ordered  Please enroll and let me know when you do  Thanks Puneet Masoner

## 2022-03-21 NOTE — Progress Notes (Unsigned)
Enrolled for [employer] to mail a ZIO XT long term holter monitor to the patients address on file.  

## 2022-03-22 ENCOUNTER — Telehealth: Payer: Self-pay | Admitting: *Deleted

## 2022-03-22 ENCOUNTER — Ambulatory Visit: Payer: Medicare PPO | Attending: Cardiology

## 2022-03-22 DIAGNOSIS — I10 Essential (primary) hypertension: Secondary | ICD-10-CM | POA: Diagnosis not present

## 2022-03-22 DIAGNOSIS — E785 Hyperlipidemia, unspecified: Secondary | ICD-10-CM

## 2022-03-22 DIAGNOSIS — R001 Bradycardia, unspecified: Secondary | ICD-10-CM | POA: Diagnosis not present

## 2022-03-22 DIAGNOSIS — R0602 Shortness of breath: Secondary | ICD-10-CM | POA: Diagnosis not present

## 2022-03-22 DIAGNOSIS — R002 Palpitations: Secondary | ICD-10-CM

## 2022-03-22 DIAGNOSIS — R072 Precordial pain: Secondary | ICD-10-CM

## 2022-03-22 HISTORY — PX: OTHER SURGICAL HISTORY: SHX169

## 2022-03-22 LAB — BASIC METABOLIC PANEL
BUN/Creatinine Ratio: 18 (ref 12–28)
BUN: 17 mg/dL (ref 8–27)
CO2: 27 mmol/L (ref 20–29)
Calcium: 10.5 mg/dL — ABNORMAL HIGH (ref 8.7–10.3)
Chloride: 103 mmol/L (ref 96–106)
Creatinine, Ser: 0.93 mg/dL (ref 0.57–1.00)
Glucose: 98 mg/dL (ref 70–99)
Potassium: 4.8 mmol/L (ref 3.5–5.2)
Sodium: 140 mmol/L (ref 134–144)
eGFR: 63 mL/min/{1.73_m2} (ref >59)

## 2022-03-22 LAB — LIPID PANEL
Chol/HDL Ratio: 2.4 ratio (ref 0.0–4.4)
Cholesterol, Total: 207 mg/dL — ABNORMAL HIGH (ref 100–199)
HDL: 86 mg/dL (ref >39)
LDL Chol Calc (NIH): 112 mg/dL — ABNORMAL HIGH (ref 0–99)
Triglycerides: 47 mg/dL (ref 0–149)
VLDL Cholesterol Cal: 9 mg/dL (ref 5–40)

## 2022-03-22 NOTE — Telephone Encounter (Signed)
-----   Message from Melony Overly sent at 03/22/2022 11:47 AM EDT ----- Regarding: ct heart Scheduled at 11/14 at at 12:00

## 2022-03-23 ENCOUNTER — Telehealth: Payer: Self-pay | Admitting: *Deleted

## 2022-03-23 NOTE — Telephone Encounter (Signed)
The patient has been notified of the result and verbalized understanding.  All questions (if any) were answered. Nuala Alpha, LPN 22/06/9796 92:11 AM

## 2022-03-23 NOTE — Telephone Encounter (Signed)
-----   Message from Freada Bergeron, MD sent at 03/22/2022  7:42 PM EDT ----- Blood counts and electrolytes look good. Her cholesterol is a little elevated but we can adjust things depending on what her CT scan shows.

## 2022-03-23 NOTE — Telephone Encounter (Signed)
The patient has been notified of the result and verbalized understanding.  All questions (if any) were answered. Nuala Alpha, LPN 83/08/3733 78:97 AM        Me      03/23/22 11:05 AM Note ----- Message from Freada Bergeron, MD sent at 03/22/2022  7:42 PM EDT ----- Blood counts and electrolytes look good. Her cholesterol is a little elevated but we can adjust things depending on what her CT scan shows.

## 2022-03-23 NOTE — Telephone Encounter (Signed)
Pt is returning call. Requesting return call.

## 2022-03-24 DIAGNOSIS — R001 Bradycardia, unspecified: Secondary | ICD-10-CM

## 2022-03-24 DIAGNOSIS — R002 Palpitations: Secondary | ICD-10-CM

## 2022-03-28 DIAGNOSIS — Z23 Encounter for immunization: Secondary | ICD-10-CM | POA: Diagnosis not present

## 2022-03-28 DIAGNOSIS — Z1211 Encounter for screening for malignant neoplasm of colon: Secondary | ICD-10-CM | POA: Diagnosis not present

## 2022-03-28 DIAGNOSIS — Z0000 Encounter for general adult medical examination without abnormal findings: Secondary | ICD-10-CM | POA: Diagnosis not present

## 2022-03-28 DIAGNOSIS — E039 Hypothyroidism, unspecified: Secondary | ICD-10-CM | POA: Diagnosis not present

## 2022-03-28 DIAGNOSIS — Z1231 Encounter for screening mammogram for malignant neoplasm of breast: Secondary | ICD-10-CM | POA: Diagnosis not present

## 2022-03-28 DIAGNOSIS — C7A090 Malignant carcinoid tumor of the bronchus and lung: Secondary | ICD-10-CM | POA: Diagnosis not present

## 2022-03-28 DIAGNOSIS — I10 Essential (primary) hypertension: Secondary | ICD-10-CM | POA: Diagnosis not present

## 2022-03-28 DIAGNOSIS — R7303 Prediabetes: Secondary | ICD-10-CM | POA: Diagnosis not present

## 2022-03-28 DIAGNOSIS — F418 Other specified anxiety disorders: Secondary | ICD-10-CM | POA: Diagnosis not present

## 2022-03-31 DIAGNOSIS — R002 Palpitations: Secondary | ICD-10-CM | POA: Diagnosis not present

## 2022-03-31 DIAGNOSIS — R001 Bradycardia, unspecified: Secondary | ICD-10-CM | POA: Diagnosis not present

## 2022-04-03 ENCOUNTER — Telehealth (HOSPITAL_COMMUNITY): Payer: Self-pay | Admitting: Emergency Medicine

## 2022-04-03 ENCOUNTER — Other Ambulatory Visit (HOSPITAL_COMMUNITY): Payer: Self-pay | Admitting: *Deleted

## 2022-04-03 ENCOUNTER — Telehealth (HOSPITAL_COMMUNITY): Payer: Self-pay | Admitting: *Deleted

## 2022-04-03 DIAGNOSIS — R0602 Shortness of breath: Secondary | ICD-10-CM

## 2022-04-03 DIAGNOSIS — E785 Hyperlipidemia, unspecified: Secondary | ICD-10-CM

## 2022-04-03 DIAGNOSIS — R001 Bradycardia, unspecified: Secondary | ICD-10-CM

## 2022-04-03 DIAGNOSIS — R002 Palpitations: Secondary | ICD-10-CM

## 2022-04-03 DIAGNOSIS — I10 Essential (primary) hypertension: Secondary | ICD-10-CM

## 2022-04-03 DIAGNOSIS — R072 Precordial pain: Secondary | ICD-10-CM

## 2022-04-03 MED ORDER — METOPROLOL TARTRATE 25 MG PO TABS: 25.0000 mg | ORAL_TABLET | Freq: Once | ORAL | 0 refills | Status: DC

## 2022-04-03 NOTE — Telephone Encounter (Signed)
Patient returning call regarding upcoming cardiac imaging study; pt verbalizes understanding of appt date/time, parking situation and where to check in,  medications ordered, and verified current allergies; name and call back number provided for further questions should they arise  Gordy Clement RN Navigator Cardiac Imaging [facility] [phone] office [phone] cell  Patient to take 25mg  metoprolol tartrate two hours prior to her cardiac CT scan.  She is aware to arrive at 11:30am.

## 2022-04-03 NOTE — Telephone Encounter (Signed)
Attempted to call patient regarding upcoming cardiac CT appointment. °Left message on voicemail with name and callback number °Aliana Kreischer RN Navigator Cardiac Imaging °[facility] °[phone] Office °[phone] Cell ° °

## 2022-04-04 ENCOUNTER — Telehealth: Payer: Self-pay | Admitting: Cardiology

## 2022-04-04 ENCOUNTER — Other Ambulatory Visit: Payer: Self-pay | Admitting: *Deleted

## 2022-04-04 ENCOUNTER — Ambulatory Visit (HOSPITAL_COMMUNITY)
Admission: RE | Admit: 2022-04-04 | Discharge: 2022-04-04 | Disposition: A | Discharge status: Home / Self Care | Payer: Medicare PPO | Source: Ambulatory Visit | Attending: Cardiology | Admitting: Cardiology

## 2022-04-04 DIAGNOSIS — E785 Hyperlipidemia, unspecified: Secondary | ICD-10-CM

## 2022-04-04 DIAGNOSIS — R072 Precordial pain: Secondary | ICD-10-CM | POA: Insufficient documentation

## 2022-04-04 DIAGNOSIS — R0602 Shortness of breath: Secondary | ICD-10-CM | POA: Insufficient documentation

## 2022-04-04 DIAGNOSIS — R001 Bradycardia, unspecified: Secondary | ICD-10-CM

## 2022-04-04 DIAGNOSIS — I10 Essential (primary) hypertension: Secondary | ICD-10-CM

## 2022-04-04 DIAGNOSIS — R002 Palpitations: Secondary | ICD-10-CM

## 2022-04-04 MED ORDER — IOHEXOL 350 MG/ML SOLN
95.0000 mL | Freq: Once | INTRAVENOUS | Status: AC | PRN
  Administered 2022-04-04: 95 mL via INTRAVENOUS

## 2022-04-04 MED ORDER — NITROGLYCERIN 0.4 MG SL SUBL
SUBLINGUAL_TABLET | SUBLINGUAL | Status: DC
Start: 2022-04-04 — End: 2022-04-05
  Filled 2022-04-04: qty 2

## 2022-04-04 MED ORDER — NITROGLYCERIN 0.4 MG SL SUBL
0.8000 mg | SUBLINGUAL_TABLET | Freq: Once | SUBLINGUAL | Status: AC
Start: 2022-04-04 — End: 2022-04-04
  Administered 2022-04-04: 0.8 mg via SUBLINGUAL

## 2022-04-04 NOTE — Telephone Encounter (Signed)
[facility] Radiology call with an addendum to the patient CT she had done today.

## 2022-04-04 NOTE — Telephone Encounter (Signed)
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 Was incidentally noted to have a new soft tissue density suspicious for lymphadenopathy vs local recurrence of disease. Will send to Dr. [name] for further evaluation.   Patient prefers to stay on zetia monotherapy at this time and work on lifestyle modifications.  Cynthia Kaufman, MD

## 2022-04-04 NOTE — Telephone Encounter (Signed)
Spoke with Diane from [facility] Radiology [id number] for cardiac CT completed today.  See result for complete details.  Dr Johney Frame advised of addendum and copy provided for review.  Dr Johney Frame states she will contact pt regarding results.

## 2022-04-05 ENCOUNTER — Other Ambulatory Visit: Payer: Self-pay | Admitting: Internal Medicine

## 2022-04-05 DIAGNOSIS — R918 Other nonspecific abnormal finding of lung field: Secondary | ICD-10-CM

## 2022-04-05 NOTE — Telephone Encounter (Signed)
Pt called with concern for cardiac CT. "What do I do next"?  Next F/u march with f/u CT chest.  Dr. Johney Frame ordered Cardiac CT done 04/04/2022. "IMPRESSION: Postsurgical changes of prior right middle lobectomy for lung cancer. At the right middle lobe bronchus resection margin, there is new abnormal soft tissue density bordering the right heart. This remains suspicious for new right hilar lymphadenopathy versus local recurrence. Consider conventional CT of the chest for further evaluation. This is new compared to the most recent comparison CT 08/06/2021."

## 2022-04-10 ENCOUNTER — Telehealth: Payer: Self-pay | Admitting: Internal Medicine

## 2022-04-10 NOTE — Telephone Encounter (Signed)
Per 11/20 in basket called and spoke to pt about appointments

## 2022-04-11 DIAGNOSIS — L501 Idiopathic urticaria: Secondary | ICD-10-CM | POA: Diagnosis not present

## 2022-04-24 ENCOUNTER — Encounter (HOSPITAL_COMMUNITY)
Admission: RE | Admit: 2022-04-24 | Discharge: 2022-04-24 | Disposition: A | Discharge status: Home / Self Care | Payer: Medicare PPO | Source: Ambulatory Visit | Attending: Internal Medicine | Admitting: Internal Medicine

## 2022-04-24 DIAGNOSIS — R911 Solitary pulmonary nodule: Secondary | ICD-10-CM | POA: Diagnosis not present

## 2022-04-24 DIAGNOSIS — R918 Other nonspecific abnormal finding of lung field: Secondary | ICD-10-CM | POA: Insufficient documentation

## 2022-04-24 LAB — GLUCOSE, CAPILLARY: Glucose-Capillary: 95 mg/dL (ref 70–99)

## 2022-04-24 IMAGING — CT NM PET TUM IMG INITIAL (PI) SKULL BASE T - THIGH
7 series · 25 of 25 positions shown · non-contrast
Comparison: CT chest of [DATE]

CLINICAL DATA: Initial treatment strategy for lung mass history of
COVID vaccination on [REDACTED].

EXAM:
NUCLEAR MEDICINE PET SKULL BASE TO THIGH
TECHNIQUE: 7.7 mCi F-18 FDG was injected intravenously. Full-ring PET imaging
was performed from the skull base to thigh after the radiotracer. CT
data was obtained and used for attenuation correction and anatomic
localization.
Fasting blood glucose: 111 mg/dl

[Series 3: pet sk_thigh ac · axial · 5.0mm · 4.07mm/px · z∈[-870,-10]mm · 5 of 216 slices shown]
[im 1/216]
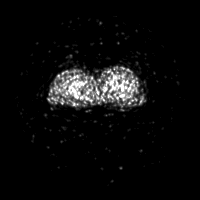
[im 54/216]
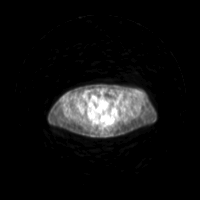
[im 108/216]
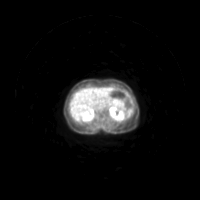
[im 162/216]
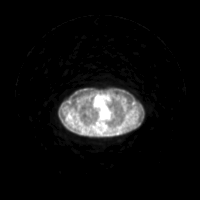
[im 216/216]
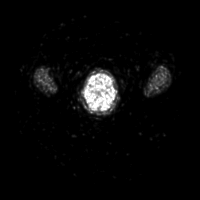

[Series 4: ct sk_thigh 5.0 b31f · axial · 5.0mm · 0.98mm/px · z∈[-870,-10]mm · 5 of 214 slices shown]
[im 1/214]
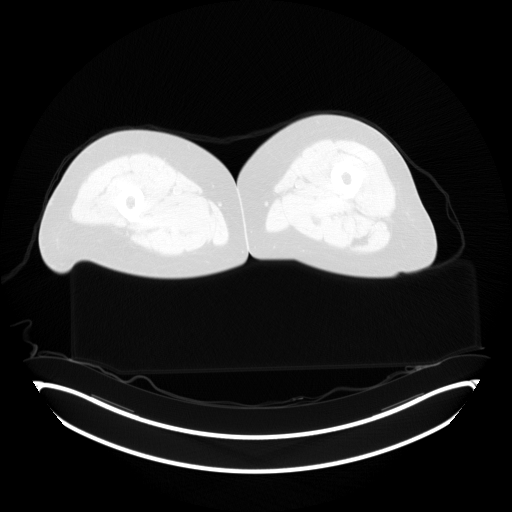
[im 54/214]
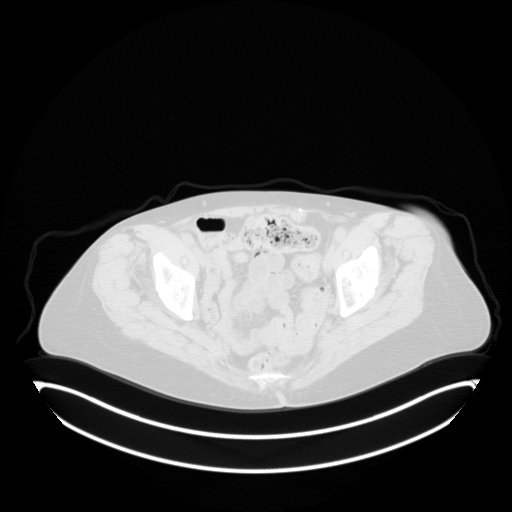
[im 107/214]
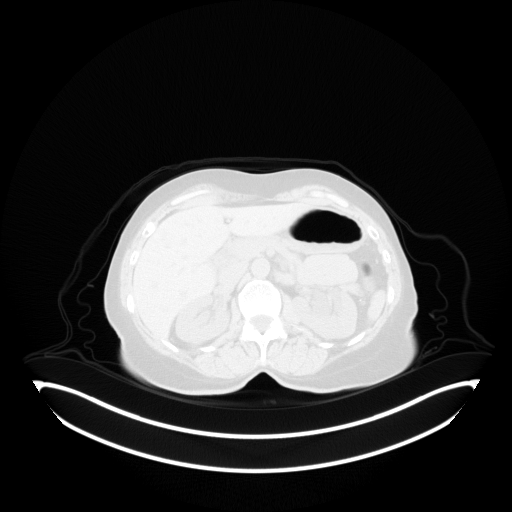
[im 160/214]
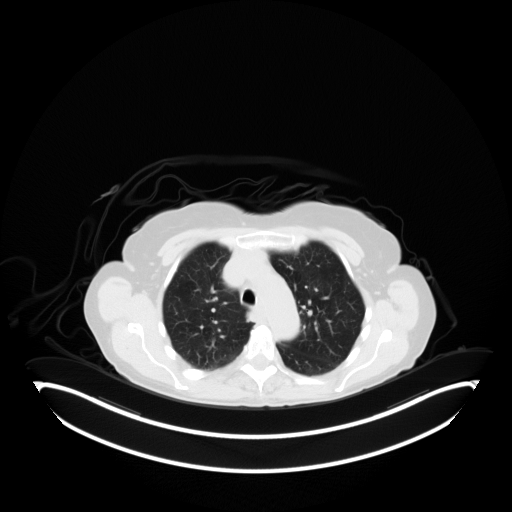
[im 214/214  brain]
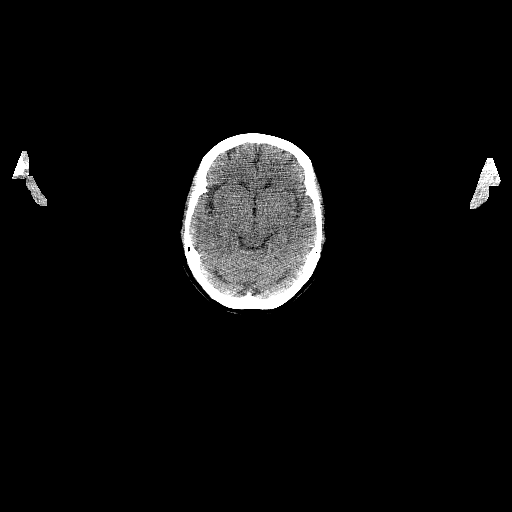

[Series 5: pet sk_thigh nac · axial · 5.0mm · 4.07mm/px · z∈[-870,-10]mm · 5 of 216 slices shown]
[im 1/216]
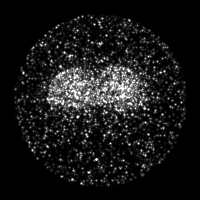
[im 54/216]
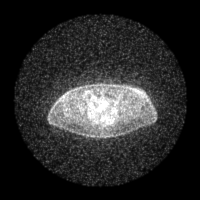
[im 108/216]
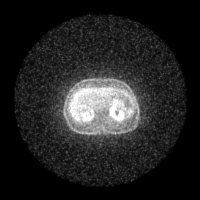
[im 162/216]
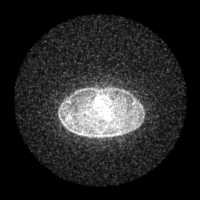
[im 216/216]
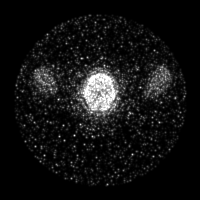

[Series 8: ct sk_thigh 5.0 b70f lung_bone · axial · 5.0mm · 0.56mm/px · z∈[-420,-160]mm · 2 of 66 slices shown]
[im 1/66  bone]
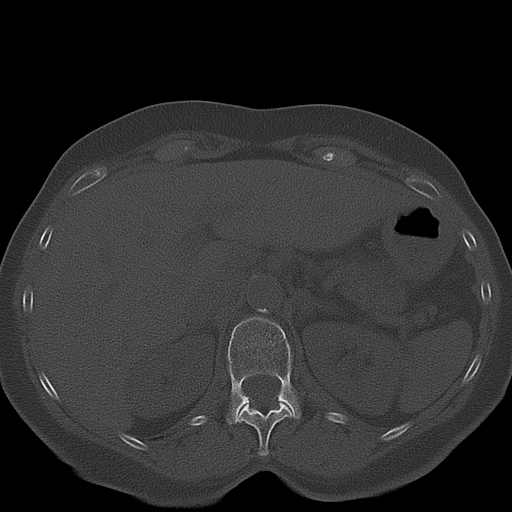
[im 66/66  bone]
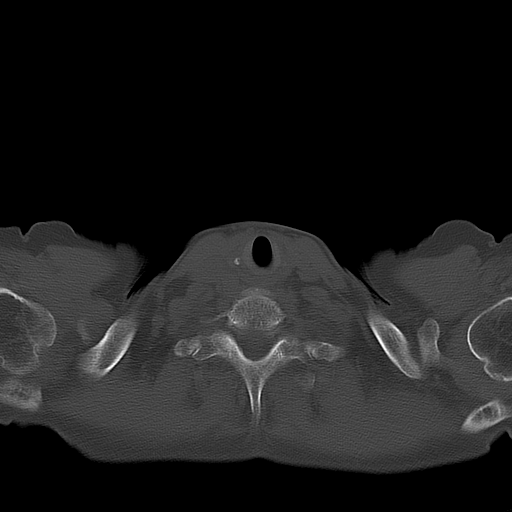

[Series 603: range-ct sk_thigh 5.0 (id)<alpha range> · 2 of 66 slices shown (1 of 2)]
[im 1/66]
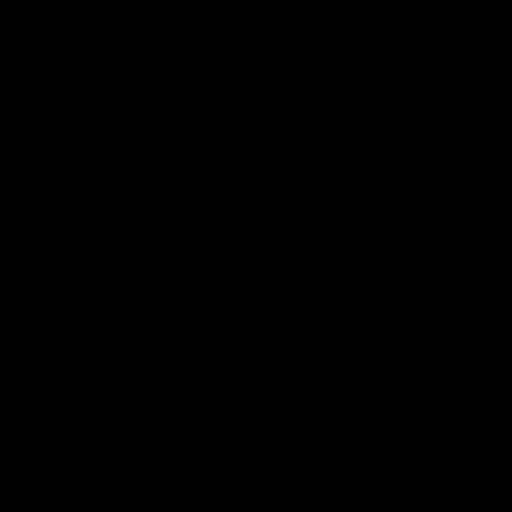
[im 66/66]
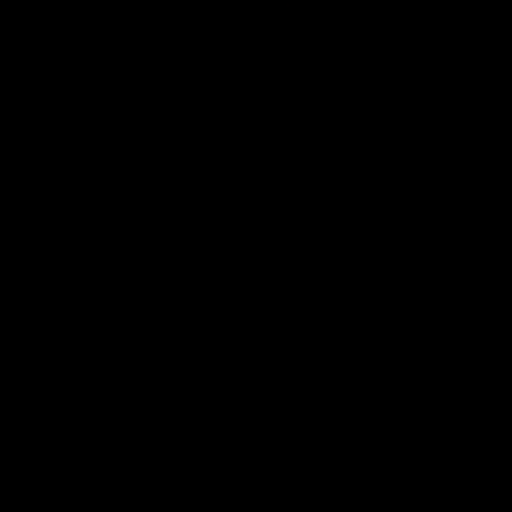

[Series 604: mip range 3 · coronal · 1.79mm/px · 1 of 32 slices shown]
[im 1/32]
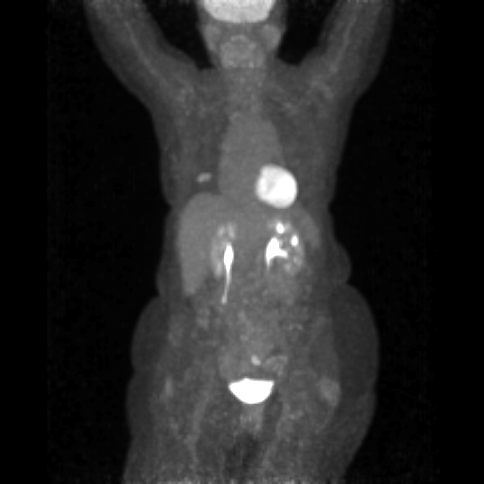

[Series 605: range-ct sk_thigh 5.0 (id)<alpha range> · 5 of 211 slices shown (2 of 2)]
[im 1/211]
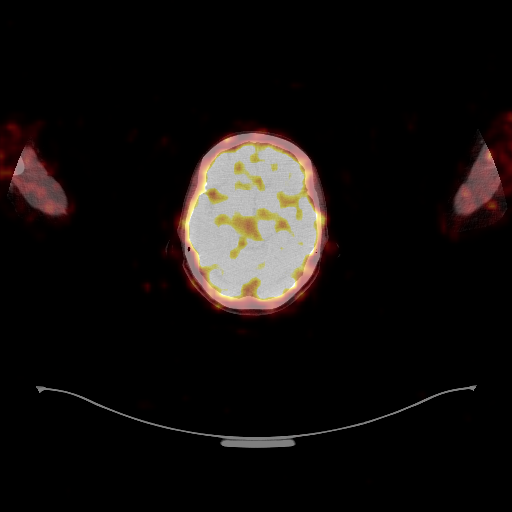
[im 53/211]
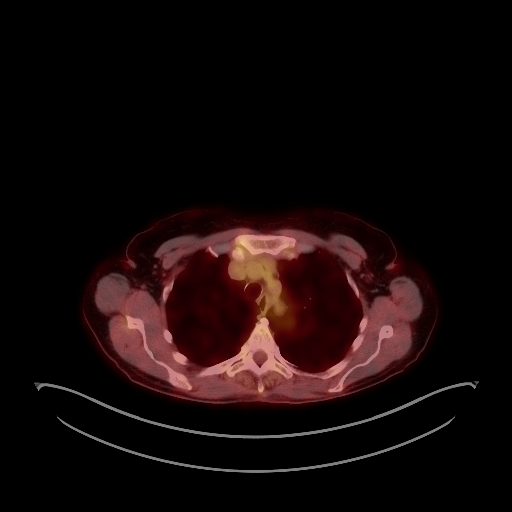
[im 106/211]
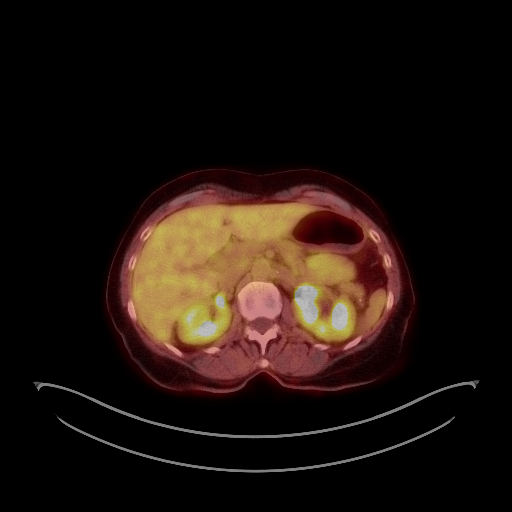
[im 158/211]
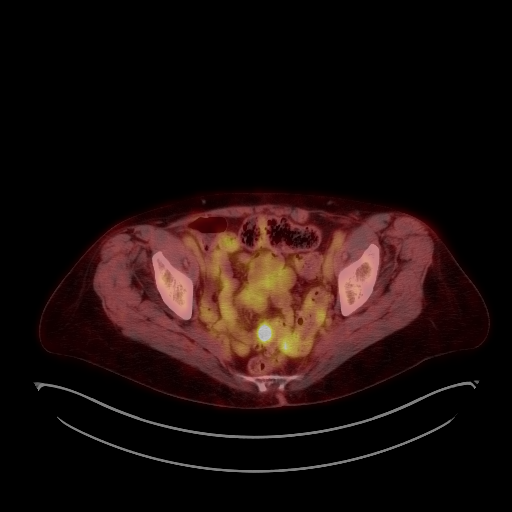
[im 211/211]
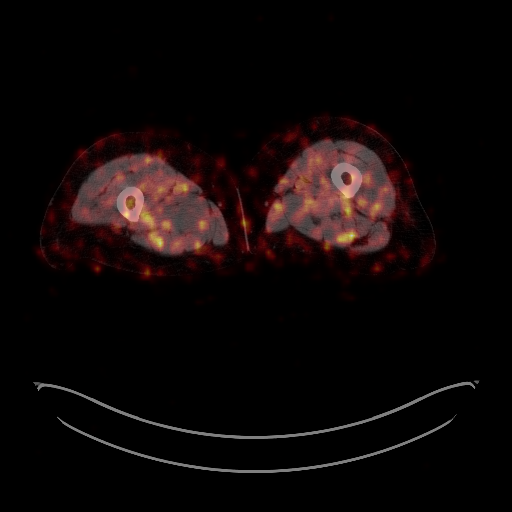

[25 of 25 positions shown; findings below may reference images not displayed]

FINDINGS: Mediastinal blood pool activity: SUV max

Liver activity: SUV max NA

NECK: No hypermetabolic lymph nodes in the neck.

Incidental CT findings: none

CHEST: Area of concern in the RIGHT middle lobe with lobular margins
and some surrounding spiculation measuring 3.1 x 1.5 cm with
bandlike areas extending to the pleural surface, stable when
measured in a similar fashion compared to the very recent comparison
study. (SUVmax = 4.8) (image 43, series 8)

5 mm RIGHT upper lobe pulmonary nodule (image 32, series 8)
demonstrates no significant FDG uptake well below blood pool
activity. Smaller nodules along the minor fissure unchanged from
previous, very recent imaging without FDG uptake. No sign of
mediastinal nodal disease

Incidental CT findings: 7 mm RIGHT thoracic inlet lesion without FDG
uptake, hyperdensity 86 Hounsfield units. Just at the level of the
thyroid bed but more focal than expected for thyroid tissue. Density
would be compatible with thyroid tissue.

Minimal atherosclerotic changes in the thoracic aorta. No
pericardial effusion.

ABDOMEN/PELVIS: Focus of increased metabolic activity in the sigmoid
colon at the site of for was felt to represent a small focus of
stool along the wall of the colon. (SUVmax = 8.7) corresponding
abnormality felt to represent a focus of stool in the colon given
mobile features, question attachment to adjacent haustration
potentially a mobile polyp in the sigmoid colon.

Incidental CT findings: Ovoid cystic area along the RIGHT flank
x 1.7 cm (image 142, series 4) no sign of increased FDG uptake or
surrounding stranding. No change from previous virtual colonoscopy.
Calcific atherosclerotic changes throughout a nonaneurysmal
abdominal aorta. Post hysterectomy.

SKELETON: No focal hypermetabolic activity to suggest skeletal
metastasis.

Incidental CT findings: none
IMPRESSION: 1. Hypermetabolic RIGHT middle lobe mass with moderate FDG uptake
remains concerning for bronchogenic neoplasm. The smaller nodule in
the RIGHT upper lobe does not display increased FDG activity
however, the possibility of additional site of disease is not
excluded.
2. No signs of adenopathy.
3. Focal area of activity in the colon may correspond to what was
felt to represent a focus of formed stool based on mobility. The
possibility of a pedunculated colonic lesion is considered. Sigmoid
assessment with direct visualization may be warranted.
4. Ovoid hyperdense area in the RIGHT neck may represent residual
thyroid tissue or parathyroid. Focused ultrasound may be helpful for
further assessment.
5. Well-circumscribed water density structure along the RIGHT flank
without surrounding stranding may represent a benign cystic area in
the peritoneum but is not well evaluated. This would be in an ideal
location for focused ultrasound assessment.

These results were called by telephone at the time of interpretation
on 10/06/2019 at 10/16/2019 to provider HO BANNING , who verbally
acknowledged these results.

## 2022-04-24 MED ORDER — FLUDEOXYGLUCOSE F - 18 (FDG) INJECTION
8.3000 | Freq: Once | INTRAVENOUS | Status: AC
  Administered 2022-04-24: 8.2 via INTRAVENOUS

## 2022-04-27 ENCOUNTER — Encounter: Payer: Self-pay | Admitting: Emergency Medicine

## 2022-04-27 ENCOUNTER — Ambulatory Visit (INDEPENDENT_AMBULATORY_CARE_PROVIDER_SITE_OTHER): Payer: Medicare PPO | Admitting: Emergency Medicine

## 2022-04-27 VITALS — BP 122/68 | HR 67 | Temp 97.7°F | Ht 66.0 in | Wt 162.6 lb

## 2022-04-27 DIAGNOSIS — L509 Urticaria, unspecified: Secondary | ICD-10-CM

## 2022-04-27 DIAGNOSIS — R0602 Shortness of breath: Secondary | ICD-10-CM

## 2022-04-27 DIAGNOSIS — R918 Other nonspecific abnormal finding of lung field: Secondary | ICD-10-CM

## 2022-04-27 LAB — PULMONARY FUNCTION TEST
DL/VA % pred: 84 %
DL/VA: 3.39 ml/min/mmHg/L
DLCO cor % pred: 71 %
DLCO cor: 14.45 ml/min/mmHg
DLCO unc % pred: 71 %
DLCO unc: 14.45 ml/min/mmHg
FEF 25-75 Post: 0.98 L/sec
FEF 25-75 Pre: 0.8 L/sec
FEF2575-%Change-Post: 22 %
FEF2575-%Pred-Post: 58 %
FEF2575-%Pred-Pre: 47 %
FEV1-%Change-Post: 7 %
FEV1-%Pred-Post: 77 %
FEV1-%Pred-Pre: 72 %
FEV1-Post: 1.74 L
FEV1-Pre: 1.62 L
FEV1FVC-%Change-Post: 6 %
FEV1FVC-%Pred-Pre: 83 %
FEV6-%Change-Post: 1 %
FEV6-%Pred-Post: 91 %
FEV6-%Pred-Pre: 90 %
FEV6-Post: 2.61 L
FEV6-Pre: 2.57 L
FEV6FVC-%Change-Post: 0 %
FEV6FVC-%Pred-Post: 104 %
FEV6FVC-%Pred-Pre: 103 %
FVC-%Change-Post: 0 %
FVC-%Pred-Post: 87 %
FVC-%Pred-Pre: 87 %
FVC-Post: 2.62 L
FVC-Pre: 2.6 L
Post FEV1/FVC ratio: 66 %
Post FEV6/FVC ratio: 99 %
Pre FEV1/FVC ratio: 62 %
Pre FEV6/FVC Ratio: 99 %
RV % pred: 135 %
RV: 3.28 L
TLC % pred: 110 %
TLC: 5.9 L

## 2022-04-27 MED ORDER — BUDESONIDE-FORMOTEROL FUMARATE 160-4.5 MCG/ACT IN AERO: 2.0000 | INHALATION_SPRAY | Freq: Every day | RESPIRATORY_TRACT | 2 refills | Status: DC

## 2022-04-27 NOTE — Progress Notes (Signed)
Subjective:    Patient ID: Cynthia Rhodes, female    DOB: 1946-04-04, 76 y.o.   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  We have followed small 3-4 mm bilateral pulmonary nodular disease with serial imaging, last was 07/2021, stable as below planning for repeat CT in March 2024. Today she reports that she went off xolair for several months in late 2022 to see if she would tolerate. Her rash returned and she required low dose prednisone. She restarted the xolair about 6 months ago, had good response and is off prednisone.  She has begun to notice some exertional SOB beginning this summer, with yard work and with walking around the block. She does still exercise several days a week, notices some cough and dyspnea. Minimally productive. She uses nasal saline, has had some increased nasal congestion.   CT scan of the chest 08/06/2021 reviewed by me shows stable changes post right middle lobe lobectomy and right upper lobe wedge resection, some right middle lobe medial scar and micronodular disease that is stable compared with priors.   ROV 04/27/2022 --follow-up visit for 76 year old woman with chronic allergic rhinitis/hives on Xolair, history of carcinoid tumor with right middle and right upper lobe wedge resection 8/21.  She also has mild asthma.  We have been following small pulmonary nodules, neck scan planned for March/2024.  She has been having some increased exertional dyspnea and we decided to repeat her pulmonary function testing as below. She is having cough, a bit more than last time. She is getting SOB when walking her dog.  She has undergone a cardiac evaluation as part of her dyspnea workup.  This included a cardiac calcium scoring CT chest with a calcium score of 14.7 (30th percentile).   Of note it showed postsurgical changes from her lobectomy and wedge resection as well as some soft tissue density bordering the right heart of unclear etiology.  This prompted a PET scan as below which showed no evidence of suspicious hypermetabolic activity along the suture lines, resolution of the soft tissue density along the right heart border, question fluid density in the pericardial recess.   Pulmonary function testing shows moderate obstruction without a bronchodilator response, normal lung volumes, decreased diffusion capacity that corrects to normal range when adjusted for alveolar volume.  The FEV1 has decreased compared with 12/30/2019.  Cardiac calcium CT chest 04/04/2022 showed question soft tissue density suspicious for possible lymphadenopathy anterior to the right middle lobe lobectomy and adjacent to the left atrium.  PET scan 04/25/2022 reviewed by me showed stable small bilateral pulmonary nodules largest 3 mm, persistent hypermetabolic nodular wall thickening of the sigmoid colon, no hypermetabolic pulmonary nodules or masses.  The previously identified nodularity along the right hilar suture line adjacent to the left atrium resolved and was likely fluid in the pericardial recess.     Review of Systems As per HPI     Objective:   Physical Exam Vitals:   04/27/22 1449  BP: 122/68  Pulse: 67  Temp: 97.7 F (36.5 C)  TempSrc: Oral  SpO2: 98%  Weight: 162 lb 9.6 oz (73.8 kg)  Height: 5\' 6"  (1.676 m)   Gen: Pleasant, well-nourished, in no distress,  normal affect  ENT: No lesions,  mouth clear,  oropharynx clear, no postnasal drip  Neck: No JVD, no stridor  Lungs: No use of accessory muscles, no crackles or wheezing on normal respiration, no wheeze on forced expiration  Cardiovascular: RRR, heart sounds normal, no murmur or gallops, no peripheral edema  Musculoskeletal: No deformities, no cyanosis or clubbing  Neuro: alert, awake, non focal  Skin: Warm, no  lesions or rash       Assessment & Plan:  Dyspnea Pulmonary function testing show moderate obstruction with a slightly decreased FEV1 compared with 12/30/2019.  Based on this we will do a therapeutic trial of Symbicort to see if she gets benefit.  Pulmonary nodules Stable in size, stability confirmed also on recent PET scan 04/25/2022  Hives Continue Xolair  Baltazar Apo, MD, PhD 04/27/2022, 4:50 PM [facility] [phone] or if no answer [phone]

## 2022-04-27 NOTE — Assessment & Plan Note (Signed)
Continue Xolair

## 2022-04-27 NOTE — Assessment & Plan Note (Signed)
Stable in size, stability confirmed also on recent PET scan 04/25/2022

## 2022-04-27 NOTE — Assessment & Plan Note (Signed)
Pulmonary function testing show moderate obstruction with a slightly decreased FEV1 compared with 12/30/2019.  Based on this we will do a therapeutic trial of Symbicort to see if she gets benefit.

## 2022-04-27 NOTE — Patient Instructions (Addendum)
We will do a trial of Symbicort 2 puffs twice a day.  Rinse and gargle after using.  Keep track of how this medication helps you.  We can discuss it next time and decide whether to continue it. Keep your albuterol available to use 2 puffs when needed for shortness of breath, chest tightness, wheezing. Continue your Xolair as you have been taking it Follow with cardiology as planned Follow-up with APP in about 2 months to decide whether you have benefited from the Symbicort. Follow with Dr. Lamonte Sakai in 12 months or sooner if you have any problems.

## 2022-04-27 NOTE — Progress Notes (Signed)
PFT done today. 

## 2022-05-03 ENCOUNTER — Inpatient Hospital Stay: Payer: Medicare PPO | Attending: Internal Medicine | Admitting: Internal Medicine

## 2022-05-03 ENCOUNTER — Other Ambulatory Visit: Payer: Self-pay

## 2022-05-03 VITALS — BP 141/71 | HR 97 | Temp 97.7°F | Resp 16 | Wt 160.4 lb

## 2022-05-03 DIAGNOSIS — C3490 Malignant neoplasm of unspecified part of unspecified bronchus or lung: Secondary | ICD-10-CM

## 2022-05-03 DIAGNOSIS — Z902 Acquired absence of lung [part of]: Secondary | ICD-10-CM | POA: Insufficient documentation

## 2022-05-03 DIAGNOSIS — Z85110 Personal history of malignant carcinoid tumor of bronchus and lung: Secondary | ICD-10-CM | POA: Insufficient documentation

## 2022-05-03 DIAGNOSIS — Z90710 Acquired absence of both cervix and uterus: Secondary | ICD-10-CM | POA: Insufficient documentation

## 2022-05-03 NOTE — Progress Notes (Signed)
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  PRIOR THERAPY: Status post right middle lobectomy with right upper lobe wedge resection and lymph node dissection under the care of Dr. Roxan Hockey on 01/15/2020.  CURRENT THERAPY: Observation.   INTERVAL HISTORY: Cynthia Rhodes 76 y.o. female returns to the clinic today for follow-up visit.  The patient is feeling fine today with no concerning complaints.  She denied having any current chest pain, shortness of breath, cough or hemoptysis.  She has no nausea, vomiting, diarrhea or constipation.  She has no headache or visual changes.  She has been in observation but recent CT coronary was performed on April 04, 2022 and it showed new soft tissue density just anterior to the right middle lobectomy bronchus resection margin suspicious for lymphadenopathy versus local recurrence.  The patient had a PET scan performed recently and she is here for evaluation and discussion of her scan results.  MEDICAL HISTORY: Past Medical History:  Diagnosis Date   Cancer ([place]) 2021   History of hiatal hernia    Hypothyroidism    Peripheral vascular disease ([place])    varicose veins    ALLERGIES:  is allergic to codeine, pravastatin, rosuvastatin, sulfa antibiotics, sulfacetamide sodium-sulfur, and voltaren [diclofenac sodium].  MEDICATIONS:  Current Outpatient Medications  Medication Sig Dispense Refill   acetaminophen (TYLENOL) 650 MG CR tablet Take 1,300 mg by mouth every 8 (eight) hours as needed for pain.     albuterol (VENTOLIN HFA) 108 (90 Base) MCG/ACT inhaler Inhale 2 puffs into the lungs every 6 (six) hours as needed for wheezing or shortness of breath.  8 g 6   amLODipine (NORVASC) 5 MG tablet Take 1 tablet (5 mg total) by mouth daily. 30 tablet 11   B Complex Vitamins (B COMPLEX VITAMIN PO) Take 1 tablet by mouth daily.     Calcium Carb-Cholecalciferol (CALCIUM + D3 PO) Take 1 tablet by mouth daily.      EPINEPHrine 0.3 mg/0.3 mL IJ SOAJ injection See admin instructions.     ezetimibe (ZETIA) 10 MG tablet Take 1 tablet (10 mg total) by mouth daily. 30 tablet 11   levothyroxine (SYNTHROID) 50 MCG tablet Take 50 mcg by mouth every other day. Alternating with 75 mcg     levothyroxine (SYNTHROID) 75 MCG tablet Take 75 mcg by mouth every other day. Alternating with 50 mcg     lidocaine (LIDODERM) 5 % Place 1 patch onto the skin daily as needed (pain). Remove & Discard patch within 12 hours or as directed by MD     magnesium oxide (MAG-OX) 400 MG tablet Take 400 mg by mouth daily.     Multiple Vitamins-Minerals (OCUVITE EYE HEALTH FORMULA PO) Take 1 tablet by mouth daily.     omalizumab Arvid Right) 150 MG/ML prefilled syringe Inject 150 mg into the skin every 28 (twenty-eight) days.     OVER THE COUNTER MEDICATION Take 6 capsules by mouth daily. Balance of Nature Fruits & Veggies supplement (3 Veggies + 3 Fruit)     polyvinyl alcohol (LIQUIFILM TEARS) 1.4 % ophthalmic solution Place 1 drop into both eyes as needed for dry eyes.     No current facility-administered medications for this visit.  SURGICAL HISTORY:  Past Surgical History:  Procedure Laterality Date   ABDOMINAL HYSTERECTOMY     APPENDECTOMY  1954   BRONCHIAL BIOPSY  11/04/2019   Procedure: BRONCHIAL BIOPSIES;  Surgeon: Collene Gobble, MD;  Location: [facility] ENDOSCOPY;  Service: Pulmonary;;   BRONCHIAL BRUSHINGS  11/04/2019   Procedure: BRONCHIAL BRUSHINGS;  Surgeon: Collene Gobble, MD;  Location: [facility] ENDOSCOPY;  Service: Pulmonary;;   BRONCHIAL NEEDLE ASPIRATION BIOPSY  11/04/2019   Procedure: BRONCHIAL NEEDLE ASPIRATION BIOPSIES;  Surgeon: Collene Gobble, MD;  Location: [place];   Service: Pulmonary;;   BRONCHIAL WASHINGS  11/04/2019   Procedure: BRONCHIAL WASHINGS;  Surgeon: Collene Gobble, MD;  Location: [place] ENDOSCOPY;  Service: Pulmonary;;   BUNIONECTOMY Bilateral    [place]   EYE SURGERY Bilateral 2021   INTERCOSTAL NERVE BLOCK Right 01/15/2020   Procedure: INTERCOSTAL NERVE BLOCK;  Surgeon: Melrose Nakayama, MD;  Location: [facility];  Service: Thoracic;  Laterality: Right;   JOINT REPLACEMENT Bilateral    Knee   KNEE SURGERY Bilateral    LYMPH NODE DISSECTION N/A 01/15/2020   Procedure: LYMPH NODE DISSECTION;  Surgeon: Melrose Nakayama, MD;  Location: [place];  Service: Thoracic;  Laterality: N/A;   REPLACEMENT TOTAL KNEE BILATERAL     SIGMOIDECTOMY     TONSILLECTOMY     VIDEO BRONCHOSCOPY WITH ENDOBRONCHIAL NAVIGATION N/A 11/04/2019   Procedure: VIDEO BRONCHOSCOPY WITH ENDOBRONCHIAL NAVIGATION;  Surgeon: Collene Gobble, MD;  Location: [facility] ENDOSCOPY;  Service: Pulmonary;  Laterality: N/A;   VIDEO BRONCHOSCOPY WITH ENDOBRONCHIAL NAVIGATION N/A 01/15/2020   Procedure: VIDEO BRONCHOSCOPY WITH ENDOBRONCHIAL NAVIGATION to mark right upper lobe nodule;  Surgeon: Melrose Nakayama, MD;  Location: [facility] OR;  Service: Thoracic;  Laterality: N/A;    REVIEW OF SYSTEMS:  A comprehensive review of systems was negative.   PHYSICAL EXAMINATION: General appearance: alert, cooperative, and no distress Head: Normocephalic, without obvious abnormality, atraumatic Neck: no adenopathy, no JVD, supple, symmetrical, trachea midline, and thyroid not enlarged, symmetric, no tenderness/mass/nodules Lymph nodes: Cervical, supraclavicular, and axillary nodes normal. Resp: clear to auscultation bilaterally Back: symmetric, no curvature. ROM normal. No CVA tenderness. Cardio: regular rate and rhythm, S1, S2 normal, no murmur, click, rub or gallop GI: soft, non-tender; bowel sounds normal; no masses,  no organomegaly Extremities: extremities normal,  atraumatic, no cyanosis or edema  ECOG PERFORMANCE STATUS: 0 - Asymptomatic  Blood pressure (!) 147/77, pulse 76, temperature (!) 97 F (36.1 C), temperature source Tympanic, resp. rate 18, weight 161 lb 3 oz (73.1 kg), SpO2 98 %.  LABORATORY DATA: Lab Results  Component Value Date   WBC 6.5 02/08/2021   HGB 13.2 02/08/2021   HCT 39.8 02/08/2021   MCV 90.7 02/08/2021   PLT 344 02/08/2021      Chemistry      Component Value Date/Time   NA 141 02/08/2021 0924   K 4.9 02/08/2021 0924   CL 107 02/08/2021 0924   CO2 26 02/08/2021 0924   BUN 23 02/08/2021 0924   CREATININE 0.80 08/06/2021 1012   CREATININE 0.89 02/08/2021 0924      Component Value Date/Time   CALCIUM 9.3 02/08/2021 0924   ALKPHOS 107 02/08/2021 0924   AST 43 (H) 02/08/2021 0924   ALT 37 02/08/2021 0924   BILITOT 0.5 02/08/2021 0924       RADIOGRAPHIC STUDIES: NM PET Image Restage (PS) Skull Base to Thigh (F-18 FDG)  Result Date: 04/25/2022 CLINICAL DATA:  Initial treatment strategy for pulmonary nodule history of right middle lobectomy and right upper lobe wedge resection. EXAM: NUCLEAR MEDICINE PET SKULL BASE TO THIGH TECHNIQUE: 8.2 mCi F-18 FDG was injected intravenously. Full-ring PET imaging was performed from the skull base to thigh after the radiotracer. CT data was obtained and used for attenuation correction and anatomic localization. Fasting blood glucose: 95 mg/dl COMPARISON:  Multiple priors including CT heart April 04, 2022 chest CT August 06, 2021 and PET-CT March 31, 2020 FINDINGS: Mediastinal blood pool activity: SUV max 2.3 Liver activity: SUV max NA NECK: No hypermetabolic cervical adenopathy. Incidental CT findings: None. CHEST: No hypermetabolic pulmonary nodules or masses. Previously identified nodularity along the right hilar suture line adjacent to the left atrium has resolved in the interim in retrospect this measured near fluid density on noncontrast enhanced portion of the cardiac CT  and is most compatible with fluid in a pericardial recess. No hypermetabolic thoracic adenopathy. Surgical changes of right middle lobectomy and right upper lobe wedge resection without suspicious hypermetabolic activity along the suture lines. Incidental CT findings: Stable tiny bilateral pulmonary nodules for instance a 3 mm pulmonary nodule in the peripheral right upper lobe, which do not demonstrate abnormal FDG avidity but are below the resolution of chest CT, stability is indicative of the benign etiology. Aortic atherosclerosis. Coronary artery calcifications. Calcifications of the mitral annulus. Cardiac enlargement ABDOMEN/PELVIS: Persistent hypermetabolic nodular wall thickening of the sigmoid colon measuring 17 mm on image 157/4 with a max SUV of 11.4 previously with a max SUV of 9.0. No abnormal hypermetabolic activity within the liver, pancreas, adrenal glands, or spleen. No hypermetabolic lymph nodes in the abdomen or pelvis. Incidental CT findings: Bilateral renal sinus cysts, considered benign requiring no independent imaging follow-up. Surgical clips in the left anterior abdominal wall. Aortic atherosclerosis. Colonic diverticulosis without findings of acute diverticulitis. SKELETON: No focal hypermetabolic activity to suggest skeletal metastasis. Incidental CT findings: None. IMPRESSION: 1. Stable surgical changes of prior right middle lobectomy and right upper lobe wedge resection without suspicious hypermetabolic nodularity along the suture lines. Previously identified nodularity along the right hilar suture line adjacent to the left atrium has resolved in the interim in retrospect this measured near fluid density on noncontrast enhanced portion of the cardiac CT and is most compatible with fluid in a pericardial recess. 2. 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 Recommend correlation with colonoscopy. 3.  Aortic  Atherosclerosis (ICD10-I70.0). Electronically Signed   By: Dahlia Bailiff M.D.   On: 04/25/2022 14:26   CT CORONARY MORPH W/CTA COR W/SCORE W/CA W/CM &/OR WO/CM  Addendum Date: 04/04/2022   ADDENDUM REPORT: 04/04/2022 15:59 EXAM: OVER-READ INTERPRETATION  CT CHEST The following report is an over-read performed by radiologist Dr. Yvonne Kendall of [facility], [place] on 04/04/2022. This over-read does not include interpretation of cardiac or coronary anatomy or pathology. The coronary calcium score/coronary CTA interpretation by the cardiologist is attached. COMPARISON:  Chest two views 02/17/2022, CT chest with contrast 08/06/2021 FINDINGS: Cardiovascular: There are no significant extracardiac vascular findings. Mediastinum/Nodes: Just anterior to the right middle lobectomy bronchus resection margin, there is new soft tissue density suspicious for lymphadenopathy versus local recurrence, measuring up to approximately 1.7 x 3.7 x 2.1 cm (axial series 11, image 15 and coronal series 602, image 74). Lungs/Pleura: There is no pleural effusion. Note is made of postsurgical changes of prior right middle lobectomy, only partially visualized on the current study he had  reportedly also prior right upper lobe paramedian wedge resection. Moderate chronic interlobular septal thickening. No acute lung airspace opacity. Upper abdomen: No significant findings in the visualized upper abdomen. Musculoskeletal/Chest wall: Mild-to-moderate degenerative disc and endplate changes within the lower thoracic spine. IMPRESSION: Postsurgical changes of prior right middle lobectomy for lung cancer. At the right middle lobe bronchus resection margin, there is new abnormal soft tissue density bordering the right heart. This remains suspicious for new right hilar lymphadenopathy versus local recurrence. Consider conventional CT of the chest for further evaluation. This is new compared to the most recent comparison CT 08/06/2021. These  results will be called to the ordering clinician or representative by the Radiologist Assistant, and communication documented in the PACS or [employer]. Electronically Signed   By: Yvonne Kendall M.D.   On: 04/04/2022 15:59   Result Date: 04/04/2022 HISTORY: Chest pain, nonspecific EXAM: Cardiac/Coronary  CT TECHNIQUE: The patient was scanned on a [employer]. PROTOCOL: A 120 kV prospective scan was triggered in the descending thoracic aorta at 111 HU's. Axial non-contrast 3 mm slices were carried out through the heart. The data set was analyzed on a dedicated work station and scored using the Agatston method. Gantry rotation speed was 250 msecs and collimation was .6 mm. Beta blockade and 0.8 mg of sl NTG was given. The 3D data set was reconstructed in 5% intervals of the 35-75 % of the R-R cycle. Systolic and diastolic phases were analyzed on a dedicated work station using MPR, MIP and VRT modes. The patient received contrast: 41mL OMNIPAQUE IOHEXOL 350 MG/ML SOLN. FINDINGS: Image quality: Good Noise artifact is: Limited Coronary calcium score is 14.7, which places the patient in the 30th percentile for age and sex matched control. Coronary arteries: Normal coronary origins.  Right dominance. Right Coronary Artery: Minimal ostial mixed atherosclerotic plaque, <25% stenosis. Left Main Coronary Artery: No detectable plaque or stenosis. Left Anterior Descending Coronary Artery: Minimal mixed atherosclerotic plaque in the proximal and mid LAD, <25% stenosis. Left Circumflex Artery: Minimal mixed atherosclerotic plaque in the proximal LCx, <25% stenosis. Aorta: Normal size, 35 mm at the mid ascending aorta (level of the PA bifurcation) measured double oblique. Mild root calcifications. No dissection. Aortic Valve: Mild calcifications. AV calcium score 181. Other findings: Normal pulmonary vein drainage into the left atrium. Normal left atrial appendage without thrombus. Normal size of the pulmonary  artery. Minimal mitral annular calcifications. Please see separate report from [facility] for non-cardiac findings. IMPRESSION: 1. 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 2. Coronary calcium score is 14.7, which places the patient in the 30th percentile for age and sex matched control. 3. Normal coronary origins with right dominance. RECOMMENDATIONS: CAD-RADS 1. Minimal non-obstructive CAD (0-24%). Consider non-atherosclerotic causes of chest pain. Consider preventive therapy and risk factor modification. Electronically Signed: By: Cherlynn Kaiser M.D. On: 04/04/2022 13:56     ASSESSMENT AND PLAN: This is a very pleasant 76 years old white female recently diagnosed with a stage IIIA (T4, N0, M0) well-differentiated neuroendocrine tumor, carcinoid tumor with multifocal disease in the right upper lobe as well as the right middle lobe status post right middle lobectomy and wedge resection of the right upper lobe with lymph node dissection under the care of Dr. Roxan Hockey on 01/15/2020. The patient is currently on observation and she is feeling fine with no concerning complaints.  She had repeat PET scan performed recently for evaluation of the suspicious new soft tissue density seen on the previous CT coronary.  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 I recommended for the patient to reach out to her gastroenterologist with [facility] physicians for reevaluation and consideration of repeat colonoscopy if needed. I will see the patient back for follow-up visit in 1 year for evaluation and repeat CT scan of the chest for restaging of her disease. She was advised to call immediately if she has any other concerning symptoms in the interval. The patient voices understanding of current  disease status and treatment options and is in agreement with the current care plan.  All questions were answered. The patient knows to call the clinic with any problems, questions or concerns. We can certainly see the patient much sooner if necessary.  Disclaimer: This note was dictated with voice recognition software. Similar sounding words can inadvertently be transcribed and may not be corrected upon review.

## 2022-05-04 ENCOUNTER — Encounter: Payer: Self-pay | Admitting: Medical Oncology

## 2022-05-05 DIAGNOSIS — K9161 Intraoperative hemorrhage and hematoma of a digestive system organ or structure complicating a digestive sytem procedure: Secondary | ICD-10-CM | POA: Diagnosis not present

## 2022-05-05 DIAGNOSIS — K5730 Diverticulosis of large intestine without perforation or abscess without bleeding: Secondary | ICD-10-CM | POA: Diagnosis not present

## 2022-05-05 DIAGNOSIS — D123 Benign neoplasm of transverse colon: Secondary | ICD-10-CM | POA: Diagnosis not present

## 2022-05-05 DIAGNOSIS — D125 Benign neoplasm of sigmoid colon: Secondary | ICD-10-CM | POA: Diagnosis not present

## 2022-05-05 DIAGNOSIS — R933 Abnormal findings on diagnostic imaging of other parts of digestive tract: Secondary | ICD-10-CM | POA: Diagnosis not present

## 2022-05-05 DIAGNOSIS — D127 Benign neoplasm of rectosigmoid junction: Secondary | ICD-10-CM | POA: Diagnosis not present

## 2022-05-07 IMAGING — US US THYROID
1 series · 13 of 25 positions shown · non-contrast
Comparison: PET-CT 10/16/2019

CLINICAL DATA: Right neck nodule on recent PET-CT, without
hypermetabolic activity.

EXAM:
THYROID ULTRASOUND
TECHNIQUE: Ultrasound examination of the thyroid gland and adjacent soft
tissues was performed.

[Series 1: us thyroid · 0.04mm/px · 43 acquisitions, 13 frames shown]
[im 1/43]
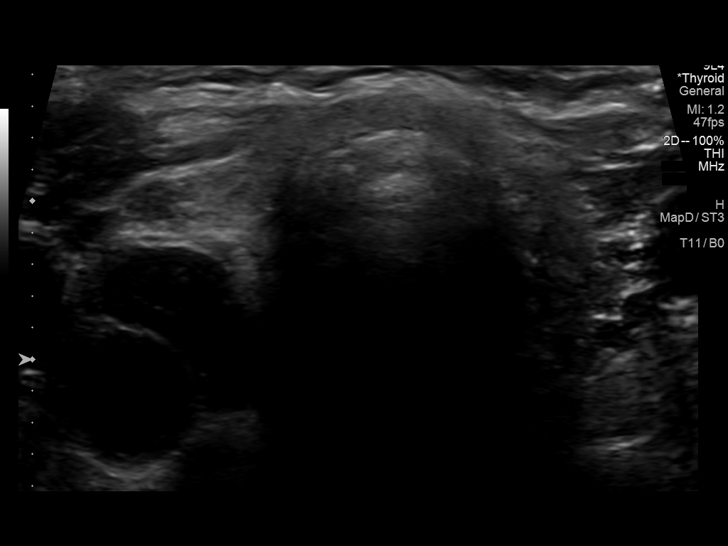
[im 4/43]
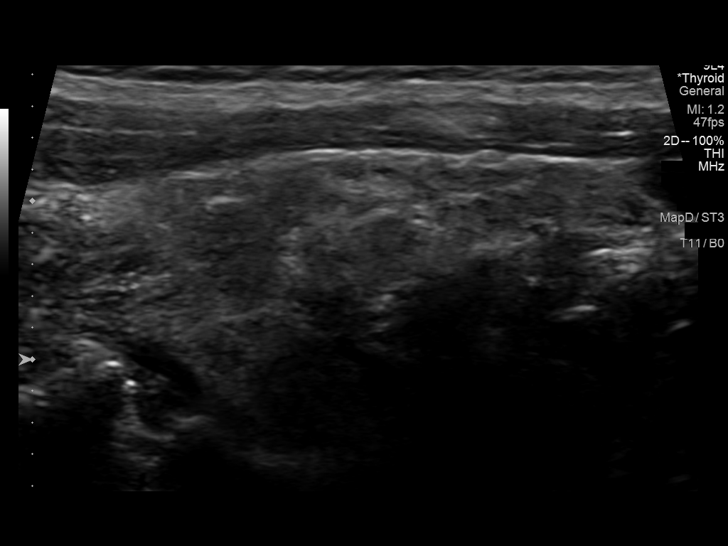
[im 8/43]
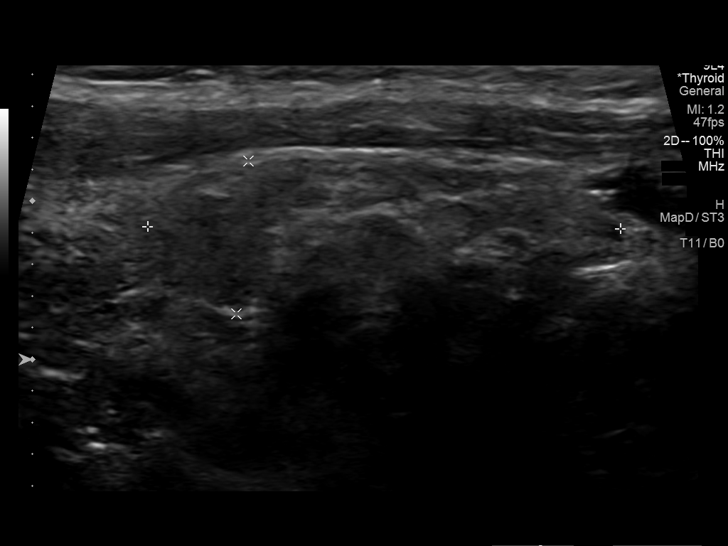
[im 11/43]
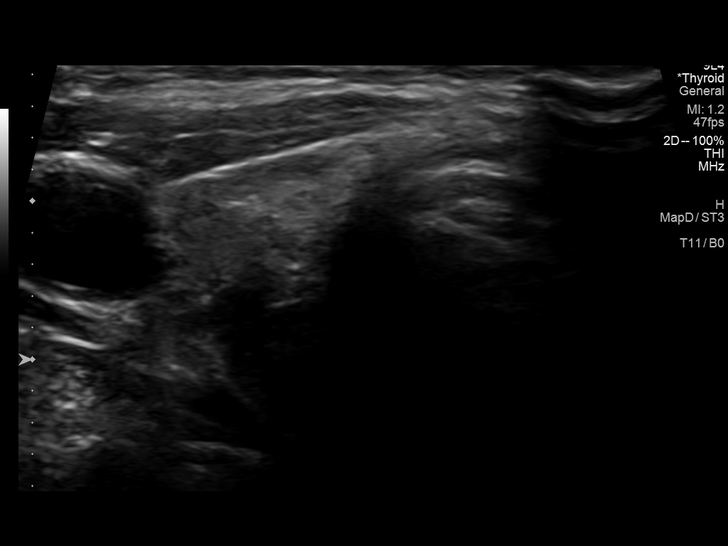
[im 15/43]
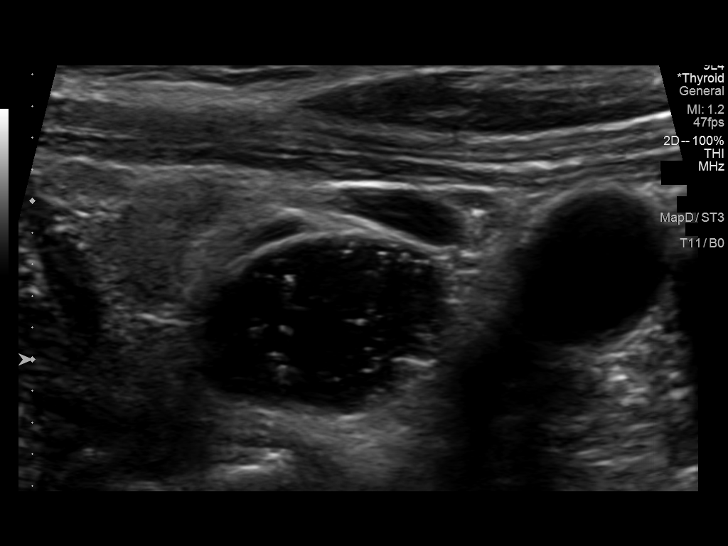
[im 18/43]
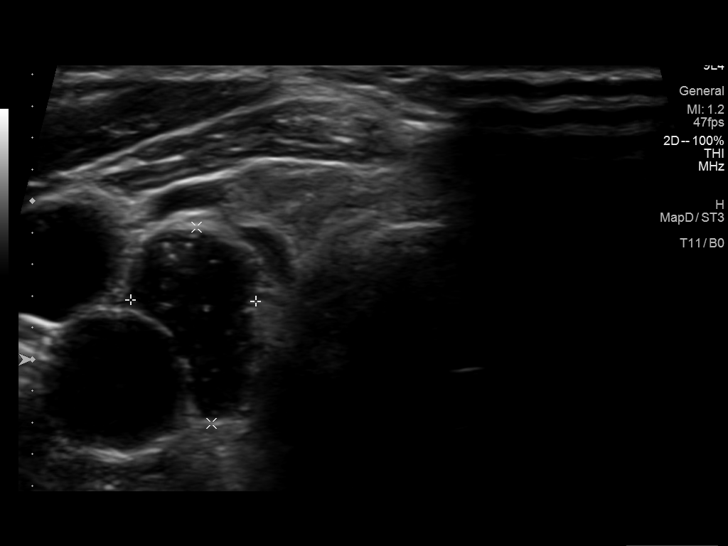
[im 22/43]
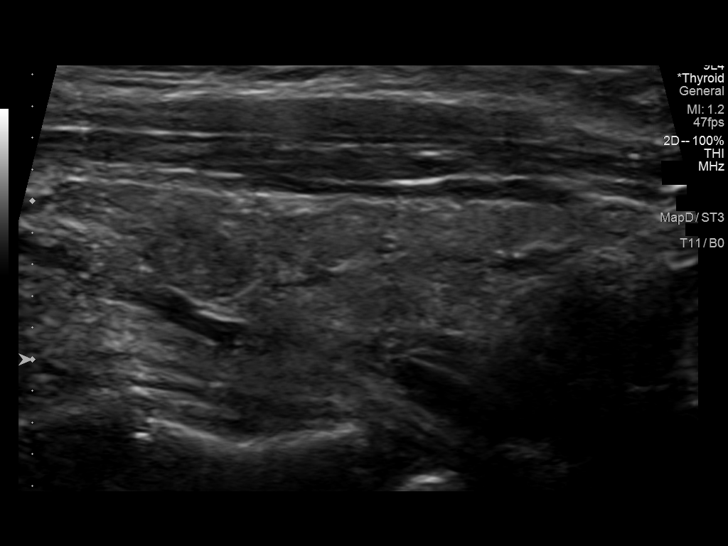
[im 25/43]
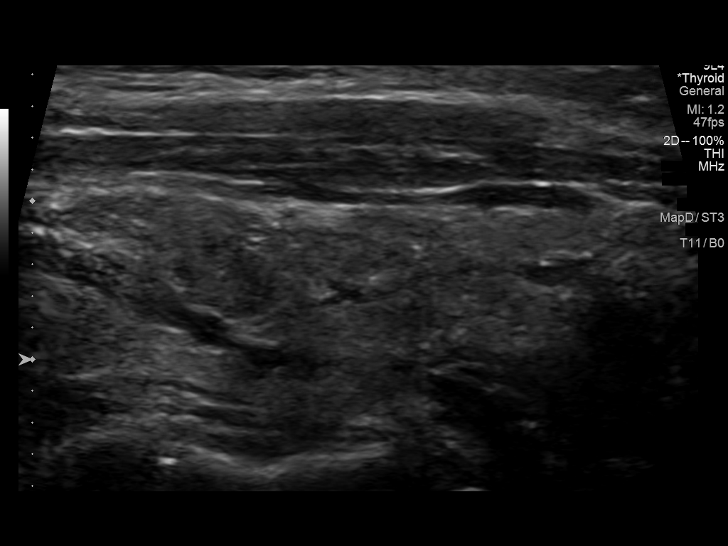
[im 29/43]
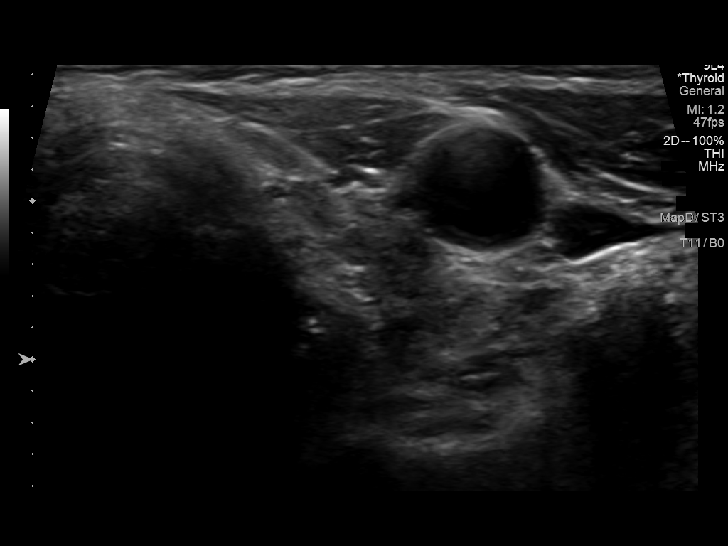
[im 32/43]
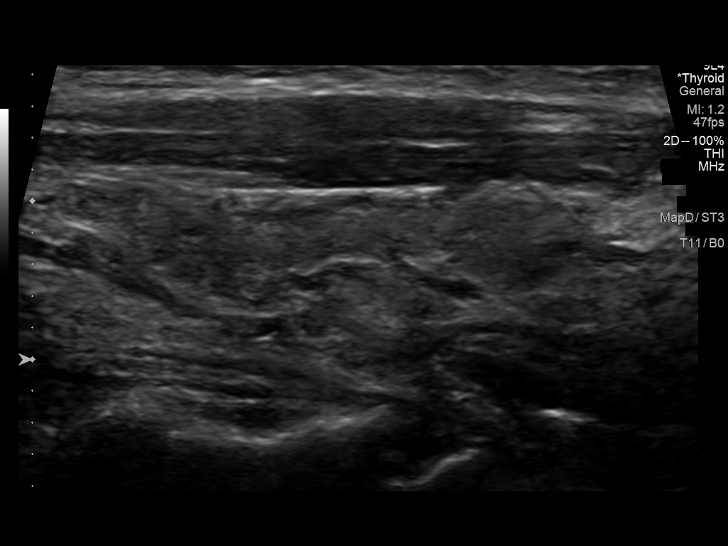
[im 36/43]
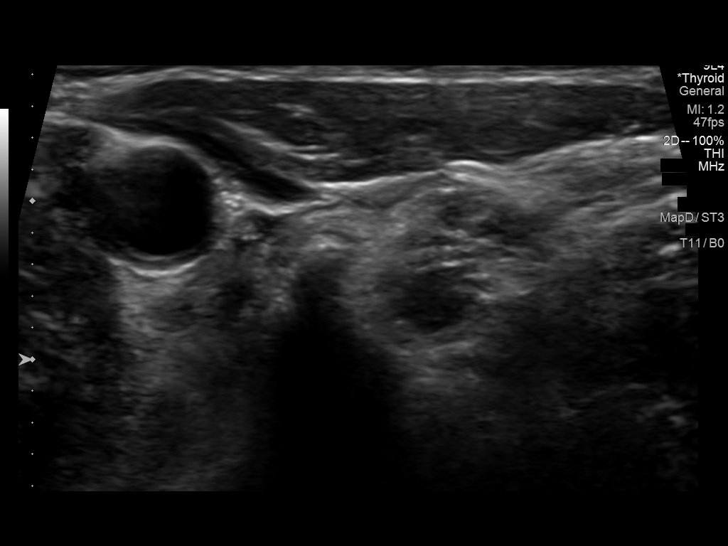
[im 39/43]
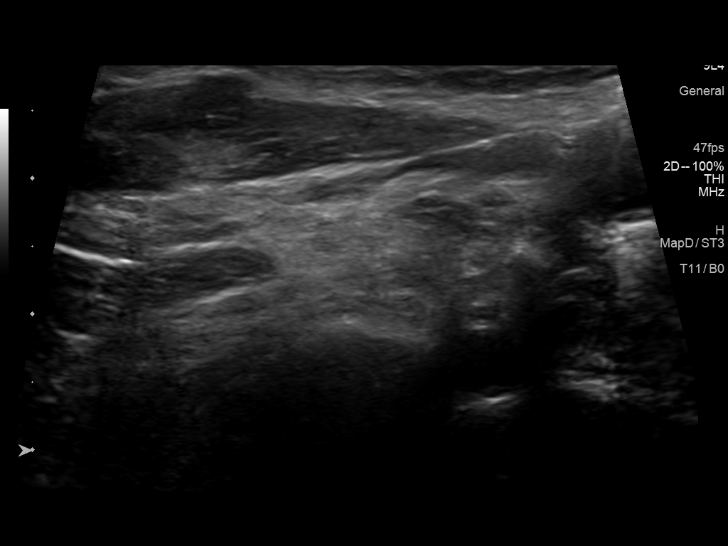
[im 43/43]
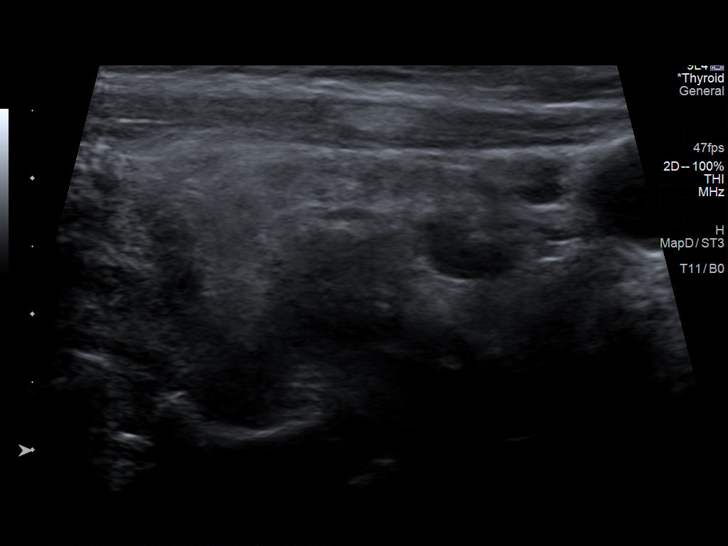

[13 of 25 positions shown; findings below may reference images not displayed]

FINDINGS: Parenchymal Echotexture: Moderately heterogenous

Isthmus: 0.2 cm thickness

Right lobe: 3 x 1 x 1.3 cm

Left lobe: 3.4 x 0.9 x 0.7 cm

_________________________________________________________

Estimated total number of nodules >/= 1 cm: 0

Number of spongiform nodules >/=  2 cm not described below (TR1): 0

Number of mixed cystic and solid nodules >/= 1.5 cm not described
below (TR2): 0

_________________________________________________________

No discrete nodules are seen within the thyroid gland.

Posterolateral to the right lobe of the thyroid is a 1.7 x 0.8 x
cm cystic lesion with internal echogenic foci, no significant color
flow signal on color Doppler, no soft tissue component.
IMPRESSION: 1. Small thyroid without nodule or other indication for biopsy or
dedicated imaging follow-up.
2. 1.7 cm cystic lesion posterolateral to the right thyroid lobe
corresponding to lesion on PET-CT without hypermetabolic activity.
If related to ectopic thyroid tissue, would be consistent with a
benign colloid cyst. Correlate with any clinical or laboratory
evidence of parathyroid pathology.

The above is in keeping with the ACR TI-RADS recommendations - [HOSPITAL] 7166;[DATE].

## 2022-05-09 DIAGNOSIS — L501 Idiopathic urticaria: Secondary | ICD-10-CM | POA: Diagnosis not present

## 2022-05-18 ENCOUNTER — Ambulatory Visit: Payer: Medicare PPO | Attending: Cardiology | Admitting: Cardiology

## 2022-05-18 VITALS — BP 128/60 | HR 71 | Ht 66.0 in | Wt 159.2 lb

## 2022-05-18 DIAGNOSIS — R002 Palpitations: Secondary | ICD-10-CM | POA: Diagnosis not present

## 2022-05-18 DIAGNOSIS — E785 Hyperlipidemia, unspecified: Secondary | ICD-10-CM | POA: Diagnosis not present

## 2022-05-18 DIAGNOSIS — I10 Essential (primary) hypertension: Secondary | ICD-10-CM

## 2022-05-18 DIAGNOSIS — R001 Bradycardia, unspecified: Secondary | ICD-10-CM

## 2022-05-18 DIAGNOSIS — R0602 Shortness of breath: Secondary | ICD-10-CM | POA: Diagnosis not present

## 2022-05-18 DIAGNOSIS — E7800 Pure hypercholesterolemia, unspecified: Secondary | ICD-10-CM

## 2022-05-18 NOTE — Progress Notes (Signed)
Cardiology Office Note:    Date:  05/18/2022   ID:  Cynthia Rhodes, DOB 08/14/1945, MRN [id number]  PCP:  [name], DO  [facility] Cardiologist:  Freada Bergeron, MD  [facility] Electrophysiologist:  None   Referring MD: [name], DO    History of Present Illness:    Cynthia Rhodes is a 76 y.o. 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  During our visit on 03/01/20, she was seen for incidental asymptomatic bradycardia. She was not on nodal agents at that time and we decided to continue to monitor.   Had admission to [facility] 04/2020 with left anterior chest pain radiating to left neck and shoulder x2 days after going to the chiropracter. She presented to the ED as the pain was severe 9/10. There,  Initial ECG with TWI in V1 and V2. Repeat showed TWI in V1. She had previous TWI in V1 (ECG 03/01/20 as well as prior), so overall not significantly changed. hsTnI normal at 5-->5. ESR normal, CRP elevated at 2.0. TSH normal. Tchol not at goal, LDL 124. A1c 6.0. TTE with normal BiV function. Thought to be noncardiac in nature as pain reproducible with palpation. She was recommended for symptomatic management and discharged home. Of note, she had very elevated blood pressure and was started on amlodipine prior to discharge  During last visit on 08/20/20, the patient was doing well. Bood pressures better controlled.  Seen in clinic on 10/2020 where she was doing well. BP controlled.   She as last seen on 03/21/2022 where she was having some issues with shortness of breath on exertion. Even five minutes of walking was enough for her to have SOB, which was very unusual for her. During the episodes she denied and chest pain or abnormal blood pressure but noted that her felt like the rate was all over the place. We  obtained a coronary CTA on 03/2022 which showed minimal disease with Ca score 14.7 (30%). Cardiac monitor showed NSR with 23 runs of brief SVT, rare ectopy. Patient triggered events correlated with sinus/sinus tach.  Today, she reports that she is still having issues with her breathing. Her albuterol was recently changed to Symbicort but she is still having issues. Specifically, she continues to feel very SOB with activity. We discussed her echo and CTA coronary results at length. She has scheduled follow-up with Dr. Lamonte Sakai to discuss further.   She has been feeling the "extra beats" still but she is hesitant to take medication because it has not been bothering her. No associated chest pain, lightheadedness or dizziness.  She denies any chest pain, or peripheral edema. No lightheadedness, headaches, syncope, orthopnea, or PND.   Past Medical History:  Diagnosis Date   Cancer ([place]) 2021   History of hiatal hernia    Hypothyroidism    Peripheral vascular disease ([place])    varicose veins    Past Surgical History:  Procedure Laterality Date   [place]   BRONCHIAL BIOPSY  11/04/2019   Procedure: BRONCHIAL BIOPSIES;  Surgeon: Collene Gobble, MD;  Location: [facility] ENDOSCOPY;  Service: Pulmonary;;   BRONCHIAL BRUSHINGS  11/04/2019   Procedure: BRONCHIAL BRUSHINGS;  Surgeon: Collene Gobble, MD;  Location[facility] ENDOSCOPY;  Service: Pulmonary;;   BRONCHIAL NEEDLE ASPIRATION BIOPSY  11/04/2019  Procedure: BRONCHIAL NEEDLE ASPIRATION BIOPSIES;  Surgeon: Collene Gobble, MD;  Location: [facility] ENDOSCOPY;  Service: Pulmonary;;   BRONCHIAL WASHINGS  11/04/2019   Procedure: BRONCHIAL WASHINGS;  Surgeon: Collene Gobble, MD;  Location: [facility] ENDOSCOPY;  Service: Pulmonary;;   BUNIONECTOMY Bilateral    [place] Bilateral 2021   INTERCOSTAL NERVE BLOCK Right 01/15/2020   Procedure: INTERCOSTAL NERVE BLOCK;  Surgeon: Melrose Nakayama,  MD;  Location: [place];  Service: Thoracic;  Laterality: Right;   JOINT REPLACEMENT Bilateral    Knee   KNEE SURGERY Bilateral    LYMPH NODE DISSECTION N/A 01/15/2020   Procedure: LYMPH NODE DISSECTION;  Surgeon: Melrose Nakayama, MD;  Location: [place];  Service: Thoracic;  Laterality: N/A;   REPLACEMENT TOTAL KNEE BILATERAL     SIGMOIDECTOMY     TONSILLECTOMY     VIDEO BRONCHOSCOPY WITH ENDOBRONCHIAL NAVIGATION N/A 11/04/2019   Procedure: VIDEO BRONCHOSCOPY WITH ENDOBRONCHIAL NAVIGATION;  Surgeon: Collene Gobble, MD;  Location: [facility] ENDOSCOPY;  Service: Pulmonary;  Laterality: N/A;   VIDEO BRONCHOSCOPY WITH ENDOBRONCHIAL NAVIGATION N/A 01/15/2020   Procedure: VIDEO BRONCHOSCOPY WITH ENDOBRONCHIAL NAVIGATION to mark right upper lobe nodule;  Surgeon: Melrose Nakayama, MD;  Location: [facility] OR;  Service: Thoracic;  Laterality: N/A;    Current Medications: Current Meds  Medication Sig   acetaminophen (TYLENOL) 650 MG CR tablet Take 1,300 mg by mouth every 8 (eight) hours as needed for pain.   amLODipine (NORVASC) 10 MG tablet Take 10 mg by mouth daily.   B Complex Vitamins (B COMPLEX VITAMIN PO) Take 1 tablet by mouth daily.   budesonide-formoterol (SYMBICORT) 160-4.5 MCG/ACT inhaler Inhale 2 puffs into the lungs daily.   Calcium Carb-Cholecalciferol (CALCIUM + D3 PO) Take 1 tablet by mouth daily.    cholecalciferol (VITAMIN D3) 25 MCG (1000 UNIT) tablet Take 1,000 Units by mouth daily.   denosumab (PROLIA) 60 MG/ML SOSY injection Inject 60 mg into the skin every 6 (six) months.   EPINEPHrine 0.3 mg/0.3 mL IJ SOAJ injection See admin instructions.   ezetimibe (ZETIA) 10 MG tablet Take 1 tablet (10 mg total) by mouth daily.   fluticasone (FLONASE) 50 MCG/ACT nasal spray Place 2 sprays into both nostrils daily.   levothyroxine (SYNTHROID) 50 MCG tablet Take 50 mcg by mouth every other day. Alternating with 75 mcg   levothyroxine (SYNTHROID) 75 MCG tablet Take 75 mcg by mouth every other day.  Alternating with 50 mcg   magnesium oxide (MAG-OX) 400 MG tablet Take 400 mg by mouth daily.   Multiple Vitamins-Minerals (OCUVITE EYE HEALTH FORMULA PO) Take 1 tablet by mouth daily.   omalizumab Arvid Right) 150 MG/ML prefilled syringe Inject 150 mg into the skin every 28 (twenty-eight) days.   OVER THE COUNTER MEDICATION Take 6 capsules by mouth daily. Balance of Nature Fruits & Veggies supplement (3 Veggies + 3 Fruit)     Allergies:   Codeine, Pravastatin, Rosuvastatin, Sulfa antibiotics, Sulfacetamide sodium-sulfur, and Voltaren [diclofenac sodium]   Social History   Socioeconomic History   Marital status: Widowed    Spouse name: Not on file   Number of children: Not on file   Years of education: Not on file   Highest education level: Not on file  Occupational History   Not on file  Tobacco Use   Smoking status: Never   Smokeless tobacco: Never  Vaping Use   Vaping Use: Never used  Substance and Sexual  Activity   Alcohol use: Yes    Alcohol/week: 7.0 standard drinks of alcohol    Types: 7 Glasses of wine per week   Drug use: Never   Sexual activity: Not Currently  Other Topics Concern   Not on file  Social History Narrative   Not on file   Social Determinants of Health   Financial Resource Strain: Not on file  Food Insecurity: No Food Insecurity (02/13/2022)   Hunger Vital Sign    Worried About Running Out of Food in the Last Year: Never true    Ran Out of Food in the Last Year: Never true  Transportation Needs: No Transportation Needs (02/13/2022)   PRAPARE - Hydrologist (Medical): No    Lack of Transportation (Non-Medical): No  Physical Activity: Not on file  Stress: Not on file  Social Connections: Not on file     Family History: The patient's family history includes Asthma in her mother; Breast cancer in her mother.  ROS:   Please see the history of present illness.    Review of Systems  Constitutional:  Negative for chills and  fever.  HENT:  Negative for hearing loss.   Eyes:  Negative for blurred vision.  Respiratory:  Positive for shortness of breath (on min.exertion).   Cardiovascular:  Positive for palpitations. Negative for chest pain, orthopnea, claudication, leg swelling and PND.  Gastrointestinal:  Negative for constipation, nausea and vomiting.  Genitourinary:  Negative for hematuria.  Musculoskeletal:  Negative for back pain and myalgias.  Neurological:  Negative for dizziness and loss of consciousness.  Endo/Heme/Allergies:  Negative for polydipsia.  Psychiatric/Behavioral:  Negative for depression.    EKGs/Labs/Other Studies Reviewed:    The following studies were reviewed today: Coronary CTA 03/2022: FINDINGS: Image quality: Good   Noise artifact is: Limited   Coronary calcium score is 14.7, which places the patient in the 30th percentile for age and sex matched control.   Coronary arteries: Normal coronary origins.  Right dominance.   Right Coronary Artery: Minimal ostial mixed atherosclerotic plaque, <25% stenosis.   Left Main Coronary Artery: No detectable plaque or stenosis.   Left Anterior Descending Coronary Artery: Minimal mixed atherosclerotic plaque in the proximal and mid LAD, <25% stenosis.   Left Circumflex Artery: Minimal mixed atherosclerotic plaque in the proximal LCx, <25% stenosis.   Aorta: Normal size, 35 mm at the mid ascending aorta (level of the PA bifurcation) measured double oblique. Mild root calcifications. No dissection.   Aortic Valve: Mild calcifications. AV calcium score 181.   Other findings:   Normal pulmonary vein drainage into the left atrium.   Normal left atrial appendage without thrombus.   Normal size of the pulmonary artery.   Minimal mitral annular calcifications.   Please see separate report from [facility] for non-cardiac findings.   IMPRESSION: 1. 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   2. Coronary calcium score is  14.7, which places the patient in the 30th percentile for age and sex matched control.   3. Normal coronary origins with right dominance.   RECOMMENDATIONS: CAD-RADS 1. Minimal non-obstructive CAD (0-24%). Consider non-atherosclerotic causes of chest pain. Consider preventive therapy and risk factor modification.   Cardiac Monitor 03/2022:   Patch wear time was 3 days and 9 hours   Predominant rhythm was NSR with average HR 72bpm   23 runs of SVT with longest lasting 12.2s   Rare SVE (<1%), rare VE (<1%)   Patient triggered events mainly  correlated with NSR/sinus tach. One episode correlated with brief run of SVT   No Afib or significant pauses     Patch Wear Time:  3 days and 9 hours (2023-11-03T11:21:56-0400 to 2023-11-06T20:18:59-0500)   Patient had a min HR of 52 bpm, max HR of 169 bpm, and avg HR of 72 bpm. Predominant underlying rhythm was Sinus Rhythm. 23 Supraventricular Tachycardia runs occurred, the run with the fastest interval lasting 8 beats with a max rate of 169 bpm, the  longest lasting 12.2 secs with an avg rate of 130 bpm. Supraventricular Tachycardia was detected within +/- 45 seconds of symptomatic patient event(s). Isolated SVEs were rare (<1.0%), SVE Couplets were rare (<1.0%), and SVE Triplets were rare (<1.0%).  Isolated VEs were rare (<1.0%), and no VE Couplets or VE Triplets were present. CT chest 09/2019: IMPRESSION: 1. 2.8 x 1.6 cm spiculated mass with pleural tail is noted in the right middle lobe consistent with primary malignancy. PET scan is recommended for further evaluation. These results will be called to the ordering clinician or representative by the Radiologist Assistant, and communication documented in the PACS or zVision Dashboard. 2. 7 x 5 mm nodule is noted in right upper lobe concerning for metastatic lesion. 3. Mild cardiomegaly.   ECG 01/13/20: Sinus braydcardia; no block.  Echo 04/25/20 1. Left ventricular ejection fraction, by  estimation, is 65 to 70%. The  left ventricle has normal function. The left ventricle has no regional  wall motion abnormalities. Indeterminate diastolic filling due to E-A  fusion.   2. Right ventricular systolic function is normal. The right ventricular  size is normal. Tricuspid regurgitation signal is inadequate for assessing  PA pressure.   3. The mitral valve is normal in structure. No evidence of mitral valve  regurgitation. No evidence of mitral stenosis. Moderate mitral annular  calcification.   4. The aortic valve is grossly normal. There is mild calcification of the  aortic valve. There is mild thickening of the aortic valve. Aortic valve  regurgitation is not visualized. Mild aortic valve sclerosis is present,  with no evidence of aortic valve  stenosis.   5. Pulmonic valve regurgitation not visualized.   6. The inferior vena cava is normal in size with greater than 50%  respiratory variability, suggesting right atrial pressure of 3 mmHg.   Comparison(s): No prior Echocardiogram  CTA chest 04/24/20: IMPRESSION: 1. Negative for pulmonary embolism. 2. Postsurgical changes in right lung with a small amount of new fluid or low-density in the right hilar region adjacent to the right middle lobectomy site. This fluid or low-density is nonspecific but appears to be new since 03/31/2020. Consider cardiothoracic consultation with regards to this finding. 3. No acute disease in the lungs. Stable small pulmonary nodules. Recommend attention to the small nodules on follow-up imaging.  Long Term Monitor 03/2022:   Patch wear time was 3 days and 9 hours   Predominant rhythm was NSR with average HR 72bpm   23 runs of SVT with longest lasting 12.2s   Rare SVE (<1%), rare VE (<1%)   Patient triggered events mainly correlated with NSR/sinus tach. One episode correlated with brief run of SVT   No Afib or significant pauses  CT Coronary Morph 04/04/2022: IMPRESSION: Postsurgical  changes of prior right middle lobectomy for lung cancer. At the right middle lobe bronchus resection margin, there is new abnormal soft tissue density bordering the right heart. This remains suspicious for new right hilar lymphadenopathy versus local recurrence. Consider conventional CT of  the chest for further evaluation. This is new compared to the most recent comparison CT 08/06/2021.   EKG:  EKG is personally reviewed  05/18/2022: No new tracing 03/21/22: NSR 61 bpm 04/26/20: NSR with TWI in V1 (stable)  Recent Labs: 03/22/2022: BUN 17; Creatinine, Ser 0.93; Potassium 4.8; Sodium 140  Recent Lipid Panel    Component Value Date/Time   CHOL 207 (H) 03/22/2022 0854   TRIG 47 03/22/2022 0854   HDL 86 03/22/2022 0854   CHOLHDL 2.4 03/22/2022 0854   CHOLHDL 3.1 04/24/2020 2311   VLDL 15 04/24/2020 2311   LDLCALC 112 (H) 03/22/2022 0854      Physical Exam:    VS:  BP 128/60   Pulse 71   Ht _0  (1.676 m)   Wt 159 lb 3.2 oz (72.2 kg)   SpO2 97%   BMI 25.70 kg/m     Wt Readings from Last 3 Encounters:  05/18/22 159 lb 3.2 oz (72.2 kg)  05/03/22 160 lb 6 oz (72.7 kg)  04/27/22 162 lb 9.6 oz (73.8 kg)     GEN:   Well nourished, well developed in no acute distress HEENT: Normal NECK: No JVD; No carotid bruits CARDIAC:  RRR, 1/6 systolic murmur RESPIRATORY:  Clear to auscultation without rales, wheezing or rhonchi  ABDOMEN: Soft, non-tender, non-distended MUSCULOSKELETAL:  No edema, warm SKIN: Warm and dry NEUROLOGIC:  Alert and oriented x 3 PSYCHIATRIC:  Normal affect   ASSESSMENT:    1. Shortness of breath   2. Dyslipidemia   3. Palpitations   4. Bradycardia   5. Primary hypertension   6. Pure hypercholesterolemia    PLAN:    In order of problems listed above:  #DOE: Likely related to underlying asthma. Cardiac work-up reassuring with TTE with normal BiV function, no significant valve disease. Coronary CTA with minimal CAD. Cardiac monitor with rare  ectopy. Will continue scheduled follow-up with Dr. Lamonte Sakai.  #Asymptomatic Sinus bradycardia: No lightheadedness, dizziness, or syncope. No nodal agents.  -Reassuringly asymptomatic without evidence of block -Not on nodal agents -TSH normal on current synthroid dosing   #Hypertension Controlled at home mainly 120-130s.  -Continue amlodipine 5m daily  #HLD: Intolerant of statins. Declined repatha today. -Continue zetia 125mdaily   #RUL and RML carcinoid: S/p resection in 12/2019. -Follows with Dr. MoJulien Nordmannnd Dr. ByLamonte SakaiFollow Up: 1 year  Medication Adjustments/Labs and Tests Ordered: Current medicines are reviewed at length with the patient today.  Concerns regarding medicines are outlined above.  No orders of the defined types were placed in this encounter.  No orders of the defined types were placed in this encounter.  Patient Instructions  Medication Instructions:  Your physician recommends that you continue on your current medications as directed. Please refer to the Current Medication list given to you today.  *If you need a refill on your cardiac medications before your next appointment, please call your pharmacy*  Follow-Up: [facility].  As part of our continuing mission to provide you with exceptional heart care, we have created designated Provider Care Teams.  These Care Teams include your primary Cardiologist (physician) and Advanced Practice Providers (APPs -  Physician Assistants and Nurse Practitioners) who all work together to provide you with the care you need, when you need it.  Your next appointment:   1 year(s)  The format for your next appointment:   In Person  Provider:   HeFreada Bergeron  MD     Important Information About Sugar          I,Jessica Ford,acting as a scribe for Freada Bergeron, MD.,have documented all relevant documentation on the behalf of Freada Bergeron,  MD,as directed by  Freada Bergeron, MD while in the presence of Freada Bergeron, MD.   I, Freada Bergeron, MD, have reviewed all documentation for this visit. The documentation on 05/18/22 for the exam, diagnosis, procedures, and orders are all accurate and complete.

## 2022-05-18 NOTE — Patient Instructions (Signed)
Medication Instructions:  Your physician recommends that you continue on your current medications as directed. Please refer to the Current Medication list given to you today.  *If you need a refill on your cardiac medications before your next appointment, please call your pharmacy*  Follow-Up: At [facility], you and your health needs are our priority.  As part of our continuing mission to provide you with exceptional heart care, we have created designated Provider Care Teams.  These Care Teams include your primary Cardiologist (physician) and Advanced Practice Providers (APPs -  Physician Assistants and Nurse Practitioners) who all work together to provide you with the care you need, when you need it.  Your next appointment:   1 year(s)  The format for your next appointment:   In Person  Provider:   Freada Bergeron, MD     Important Information About Sugar

## 2022-05-30 ENCOUNTER — Other Ambulatory Visit: Payer: Self-pay

## 2022-05-30 MED ORDER — EZETIMIBE 10 MG PO TABS: 10.0000 mg | ORAL_TABLET | Freq: Every day | ORAL | 11 refills | Status: DC

## 2022-05-31 ENCOUNTER — Ambulatory Visit: Payer: Medicare PPO | Admitting: Internal Medicine

## 2022-06-06 DIAGNOSIS — L501 Idiopathic urticaria: Secondary | ICD-10-CM | POA: Diagnosis not present

## 2022-06-08 ENCOUNTER — Other Ambulatory Visit: Payer: Self-pay | Admitting: Emergency Medicine

## 2022-06-08 DIAGNOSIS — K635 Polyp of colon: Secondary | ICD-10-CM | POA: Insufficient documentation

## 2022-06-22 ENCOUNTER — Ambulatory Visit: Payer: Medicare PPO | Admitting: Cardiology

## 2022-06-28 ENCOUNTER — Ambulatory Visit (INDEPENDENT_AMBULATORY_CARE_PROVIDER_SITE_OTHER): Payer: Medicare PPO | Admitting: Nurse Practitioner

## 2022-06-28 ENCOUNTER — Encounter: Payer: Self-pay | Admitting: Nurse Practitioner

## 2022-06-28 VITALS — BP 106/68 | HR 67 | Ht 66.0 in | Wt 156.8 lb

## 2022-06-28 DIAGNOSIS — J4530 Mild persistent asthma, uncomplicated: Secondary | ICD-10-CM | POA: Diagnosis not present

## 2022-06-28 DIAGNOSIS — R351 Nocturia: Secondary | ICD-10-CM | POA: Diagnosis not present

## 2022-06-28 DIAGNOSIS — G4719 Other hypersomnia: Secondary | ICD-10-CM

## 2022-06-28 DIAGNOSIS — R0602 Shortness of breath: Secondary | ICD-10-CM | POA: Diagnosis not present

## 2022-06-28 DIAGNOSIS — R011 Cardiac murmur, unspecified: Secondary | ICD-10-CM

## 2022-06-28 DIAGNOSIS — J45909 Unspecified asthma, uncomplicated: Secondary | ICD-10-CM | POA: Insufficient documentation

## 2022-06-28 LAB — CBC WITH DIFFERENTIAL/PLATELET
Basophils Absolute: 0.3 10*3/uL — ABNORMAL HIGH (ref 0.0–0.1)
Basophils Relative: 4 % — ABNORMAL HIGH (ref 0.0–3.0)
Eosinophils Absolute: 0.3 10*3/uL (ref 0.0–0.7)
Eosinophils Relative: 3.7 % (ref 0.0–5.0)
HCT: 32.2 % — ABNORMAL LOW (ref 36.0–46.0)
Hemoglobin: 10.5 g/dL — ABNORMAL LOW (ref 12.0–15.0)
Lymphocytes Relative: 27.3 % (ref 12.0–46.0)
Lymphs Abs: 2.1 10*3/uL (ref 0.7–4.0)
MCHC: 32.6 g/dL (ref 30.0–36.0)
MCV: 77.8 fl — ABNORMAL LOW (ref 78.0–100.0)
Monocytes Absolute: 1 10*3/uL (ref 0.1–1.0)
Monocytes Relative: 13.4 % — ABNORMAL HIGH (ref 3.0–12.0)
Neutro Abs: 4 10*3/uL (ref 1.4–7.7)
Neutrophils Relative %: 51.6 % (ref 43.0–77.0)
Platelets: 420 10*3/uL — ABNORMAL HIGH (ref 150.0–400.0)
RBC: 4.14 Mil/uL (ref 3.87–5.11)
RDW: 16.8 % — ABNORMAL HIGH (ref 11.5–15.5)
WBC: 7.7 10*3/uL (ref 4.0–10.5)

## 2022-06-28 NOTE — Assessment & Plan Note (Signed)
Moderate obstruction on recent PFTs without bronchodilator response. She was trialed on symbicort. Unclear if there has been any benefit. See above plan.

## 2022-06-28 NOTE — Progress Notes (Signed)
@Patient  ID: Cynthia Rhodes, female    DOB: 1946-01-27, 77 y.o.   MRN: [id number]  Chief Complaint  Patient presents with   Follow-up    Pt f/u she states that she still experiences DOE but otherwise feels okay.    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 She is a patient of Dr. Agustina Caroli and last seen in office 04/27/2022. Past medical history significant for HTN, HLD.   TEST/EVENTS:  04/24/2022 PET restage: stable surgical changes in RML. No hypermetabolism in this area. There is persistent hypermetabolic nodular wall thickening of sigmoid colon. Atherosclerosis  04/27/2022 PFT: FVC 87, FEV1 72, ratio 66, TLC 110, DLCOunc 71  04/27/2022: OV with Dr. Lamonte Sakai. Following small nodules, which have been stable. She had cardiac workup due to dyspnea; underwent cardiac CT chest with some postsurgical changes and soft tissue density bordering right heart of unclear etiology. Due to this, she had PET scan, without evidence of suspicious hypermetabolism and resolution of the soft tissue density previously seen.  To further evaluate DOE, repeat PFTs were ordered. Moderate obstruction without BD and decreased diffusion capacity. Plan to trial on Symbicort to see if she has any perceived benefit.   06/28/2022: Today - follow up Patient presents today for follow up after being started on Symbicort for asthma. She had PFTs last she was here with moderate obstruction and diffusion defect. She tells me today that she's unsure if she's noticed a huge benefit with use of the Symbicort. Possible that it helps at times but she is still having significant dyspnea on exertion. She tells me that this started the end of 2022. She gets winded walking short distances on level ground and with certain activities around the house. She has an occasional cough with clear sputum. She has  chronic sinusitis/allergies which she has dealt with for many years; on Xolair. She denies any wheezing, chest congestion, fevers, chills, palpitations, dizziness/lightheadedness, syncope, leg swelling, orthopnea. She does have symptoms of daytime fatigue and restless sleep at night. She has trouble staying asleep and wakes frequently, usually to use the restroom. She has some mild snoring. Never told she stops breathing at night. Denies morning headaches, drowsy driving, sleep parasomnias/paralysis. She was seen by cardiology, who told her they did not feel it was related to her heart and had normal cardiac CT. She hasn't had an echo since 04/2020, which was prior to the onset of her symptoms.   Allergies  Allergen Reactions   Codeine Nausea Only and Other (See Comments)    Severe headaches    Pravastatin     Joint pain on 20mg  daily   Rosuvastatin Other (See Comments)    Joint and muscle pain on 10mg  daily   Sulfa Antibiotics Nausea And Vomiting   Sulfacetamide Sodium-Sulfur Other (See Comments)   Voltaren [Diclofenac Sodium] Itching and Rash    Immunization History  Administered Date(s) Administered   Fluad Quad(high Dose 65+) 03/05/2019, 02/12/2020   Influenza-Unspecified 03/01/2022   PFIZER(Purple Top)SARS-COV-2 Vaccination 07/16/2019, 08/14/2019   Pneumococcal Conjugate-13 12/02/2017   Zoster, Live 12/02/2017    Past Medical History:  Diagnosis Date   Cancer ([place]) 2021   History of hiatal hernia    Hypothyroidism    Peripheral vascular disease ([place])    varicose veins    Tobacco History: Social History   Tobacco Use  Smoking Status Never  Smokeless  Tobacco Never   Counseling given: Not Answered   Outpatient Medications Prior to Visit  Medication Sig Dispense Refill   acetaminophen (TYLENOL) 650 MG CR tablet Take 1,300 mg by mouth every 8 (eight) hours as needed for pain.     amLODipine (NORVASC) 10 MG tablet Take 10 mg by mouth daily.     B Complex Vitamins (B  COMPLEX VITAMIN PO) Take 1 tablet by mouth daily.     budesonide-formoterol (SYMBICORT) 160-4.5 MCG/ACT inhaler Inhale 2 puffs into the lungs daily. 1 each 2   Calcium Carb-Cholecalciferol (CALCIUM + D3 PO) Take 1 tablet by mouth daily.      cholecalciferol (VITAMIN D3) 25 MCG (1000 UNIT) tablet Take 1,000 Units by mouth daily.     denosumab (PROLIA) 60 MG/ML SOSY injection Inject 60 mg into the skin every 6 (six) months.     EPINEPHrine 0.3 mg/0.3 mL IJ SOAJ injection See admin instructions.     ezetimibe (ZETIA) 10 MG tablet Take 1 tablet (10 mg total) by mouth daily. 30 tablet 11   fluticasone (FLONASE) 50 MCG/ACT nasal spray SHAKE LIQUID AND USE 2 SPRAYS IN EACH NOSTRIL DAILY 16 g 2   levothyroxine (SYNTHROID) 50 MCG tablet Take 50 mcg by mouth every other day. Alternating with 75 mcg     levothyroxine (SYNTHROID) 75 MCG tablet Take 75 mcg by mouth every other day. Alternating with 50 mcg     magnesium oxide (MAG-OX) 400 MG tablet Take 400 mg by mouth daily.     Multiple Vitamins-Minerals (OCUVITE EYE HEALTH FORMULA PO) Take 1 tablet by mouth daily.     omalizumab Arvid Right) 150 MG/ML prefilled syringe Inject 150 mg into the skin every 28 (twenty-eight) days.     OVER THE COUNTER MEDICATION Take 6 capsules by mouth daily. Balance of Nature Fruits & Veggies supplement (3 Veggies + 3 Fruit)     metoprolol tartrate (LOPRESSOR) 25 MG tablet Take 1 tablet (25 mg total) by mouth once for 1 dose. Take 90-120 minutes prior to scan. 1 tablet 0   No facility-administered medications prior to visit.     Review of Systems:   Constitutional: No weight loss or gain, night sweats, fevers, chills, or lassitude. +daytime fatigue HEENT: No headaches, difficulty swallowing, tooth/dental problems, or sore throat. No sneezing, itching, ear ache. +nasal congestion, post nasal drip CV:  No chest pain, orthopnea, PND, swelling in lower extremities, anasarca, dizziness, palpitations, syncope Resp: +shortness of  breath with exertion; mild snoring; chronic cough. No excess mucus or change in color of mucus.  No hemoptysis. No wheezing.  No chest wall deformity GI:  No heartburn, indigestion, abdominal pain, nausea, vomiting, diarrhea, change in bowel habits, loss of appetite, bloody stools.  GU: No dysuria, change in color of urine, urgency or frequency.  +nocturia  Skin: No rash, lesions, ulcerations MSK:  No joint pain or swelling. Neuro: No dizziness or lightheadedness.  Psych: No depression or anxiety. Mood stable. +sleep disturbance    Physical Exam:  BP 106/68   Pulse 67   Ht 5\' 6"  (1.676 m)   Wt 156 lb 12.8 oz (71.1 kg)   SpO2 98%   BMI 25.31 kg/m   GEN: Pleasant, interactive, well-appearing; in no acute distress. HEENT:  Normocephalic and atraumatic. PERRLA. Sclera white. Nasal turbinates pink, moist and patent bilaterally. No rhinorrhea present. Oropharynx pink and moist, without exudate or edema. No lesions, ulcerations, or postnasal drip. Mallampati II/III NECK:  Supple w/ fair ROM. No JVD present. Normal  carotid impulses w/o bruits. Thyroid symmetrical with no goiter or nodules palpated. No lymphadenopathy.   CV: Mild systolic murmur at right sternal border, RRR, no peripheral edema. Pulses intact, +2 bilaterally. No cyanosis, pallor or clubbing. PULMONARY:  Unlabored, regular breathing. Clear bilaterally A&P w/o wheezes/rales/rhonchi. No accessory muscle use.  GI: BS present and normoactive. Soft, non-tender to palpation. No organomegaly or masses detected.  MSK: No erythema, warmth or tenderness. Cap refil <2 sec all extrem. No deformities or joint swelling noted.  Neuro: A/Ox3. No focal deficits noted.   Skin: Warm, no lesions or rashe Psych: Normal affect and behavior. Judgement and thought content appropriate.     Lab Results:  CBC    Component Value Date/Time   WBC 7.7 06/28/2022 1520   RBC 4.14 06/28/2022 1520   HGB 10.5 (L) 06/28/2022 1520   HGB 13.2 02/08/2021  0924   HCT 32.2 (L) 06/28/2022 1520   PLT 420.0 (H) 06/28/2022 1520   PLT 344 02/08/2021 0924   MCV 77.8 (L) 06/28/2022 1520   MCH 30.1 02/08/2021 0924   MCHC 32.6 06/28/2022 1520   RDW 16.8 (H) 06/28/2022 1520   LYMPHSABS 2.1 06/28/2022 1520   MONOABS 1.0 06/28/2022 1520   EOSABS 0.3 06/28/2022 1520   BASOSABS 0.3 (H) 06/28/2022 1520    BMET    Component Value Date/Time   NA 140 03/22/2022 0854   K 4.8 03/22/2022 0854   CL 103 03/22/2022 0854   CO2 27 03/22/2022 0854   GLUCOSE 98 03/22/2022 0854   GLUCOSE 100 (H) 02/08/2021 0924   BUN 17 03/22/2022 0854   CREATININE 0.93 03/22/2022 0854   CREATININE 0.89 02/08/2021 0924   CALCIUM 10.5 (H) 03/22/2022 0854   GFRNONAA >60 02/08/2021 0924   GFRAA >60 02/10/2020 1144    BNP No results found for: "BNP"   Imaging:  No results found.       Latest Ref Rng & Units 04/27/2022   10:58 AM 12/30/2019   12:02 PM  PFT Results  FVC-Pre L 2.60  2.64   FVC-Predicted Pre % 87  86   FVC-Post L 2.62  2.75   FVC-Predicted Post % 87  89   Pre FEV1/FVC % % 62  70   Post FEV1/FCV % % 66  74   FEV1-Pre L 1.62  1.85   FEV1-Predicted Pre % 72  80   FEV1-Post L 1.74  2.03   DLCO uncorrected ml/min/mmHg 14.45  21.92   DLCO UNC% % 71  107   DLCO corrected ml/min/mmHg 14.45  22.06   DLCO COR %Predicted % 71  107   DLVA Predicted % 84  114   TLC L 5.90  5.64   TLC % Predicted % 110  105   RV % Predicted % 135  126     No results found for: "NITRICOXIDE"      Assessment & Plan:   Dyspnea Unclear etiology at this time. She does have moderate obstruction with a diffusion defect but unsure if ICS/LABA has made a difference. She questions a slight response. We can do a trial of ICS/LABA/LAMA therapy to see if she has any perceived benefit; otherwise, may consider stopping her inhalers or continuing ICS/LABA PRN. She does have a systolic murmur at the right sternal border on exam today. She has not had an echo since 04/2020 and  symptom onset was around a year later. We will order a repeat echo for further evaluation. She has also not had a recent  CBC; given her decreased diffusing capacity and symptoms, will order today to rule out anemia.  She also has some symptoms concerning for possible OSA with daytime fatigue, nocturia, and restless sleep. Untreated OSA could be contributing to daytime symptoms of dyspnea. We will order a home sleep study to rule this out. We discussed how untreated sleep apnea puts an individual at risk for cardiac arrhthymias, pulm HTN, DM, stroke and increases their risk for daytime accidents.  Walking oximetry today without desaturations on room air; however, did become visibly dyspneic. Recovered quickly at rest.   Patient Instructions  Trial Breztri 2 puffs Twice daily. Brush tongue and rinse mouth afterwards. If you notice a difference, please call me and I will send in a new prescription. If you don't notice any difference, you can resume the Symbicort 2 puffs daily Continue flonase 2 sprays each nostril daily Continue Xolair injections as scheduled  Echocardiogram ordered today - someone will contact you for scheduling  Home sleep study - someone will contact you for scheduling  Labs today  Follow up in 4-6 weeks with Dr. Lamonte Sakai or Alanson Aly. If symptoms do not improve or worsen, please contact office for sooner follow up or seek emergency care.    Asthma Moderate obstruction on recent PFTs without bronchodilator response. She was trialed on symbicort. Unclear if there has been any benefit. See above plan.   Excessive daytime sleepiness She has snoring, excessive daytime sleepiness, restless sleep. She also has persistent dyspnea during the day of uncertain etiology. Question if this is related to untreated OSA? Given this,  I am concerned she could have sleep disordered breathing with obstructive sleep apnea. She will need sleep study for further evaluation.    - discussed how  weight can impact sleep and risk for sleep disordered breathing - discussed options to assist with weight loss: combination of diet modification, cardiovascular and strength training exercises   - had an extensive discussion regarding the adverse health consequences related to untreated sleep disordered breathing - specifically discussed the risks for hypertension, coronary artery disease, cardiac dysrhythmias, cerebrovascular disease, and diabetes - lifestyle modification discussed   - discussed how sleep disruption can increase risk of accidents, particularly when driving - safe driving practices were discussed  Murmur Systolic murmur right sternal border. See above  I spent 45 minutes of dedicated to the care of this patient on the date of this encounter to include pre-visit review of records, face-to-face time with the patient discussing conditions above, post visit ordering of testing, clinical documentation with the electronic health record, making appropriate referrals as documented, and communicating necessary findings to members of the patients care team.  Clayton Bibles, NP 06/30/2022  Pt aware and understands NP's role.

## 2022-06-28 NOTE — Assessment & Plan Note (Signed)
Unclear etiology at this time. She does have moderate obstruction with a diffusion defect but unsure if ICS/LABA has made a difference. She questions a slight response. We can do a trial of ICS/LABA/LAMA therapy to see if she has any perceived benefit; otherwise, may consider stopping her inhalers or continuing ICS/LABA PRN. She does have a systolic murmur at the right sternal border on exam today. She has not had an echo since 04/2020 and symptom onset was around a year later. We will order a repeat echo for further evaluation. She has also not had a recent CBC; given her decreased diffusing capacity and symptoms, will order today to rule out anemia.  She also has some symptoms concerning for possible OSA with daytime fatigue, nocturia, and restless sleep. Untreated OSA could be contributing to daytime symptoms of dyspnea. We will order a home sleep study to rule this out. We discussed how untreated sleep apnea puts an individual at risk for cardiac arrhthymias, pulm HTN, DM, stroke and increases their risk for daytime accidents.  Walking oximetry today without desaturations on room air; however, did become visibly dyspneic. Recovered quickly at rest.   Patient Instructions  Trial Breztri 2 puffs Twice daily. Brush tongue and rinse mouth afterwards. If you notice a difference, please call me and I will send in a new prescription. If you don't notice any difference, you can resume the Symbicort 2 puffs daily Continue flonase 2 sprays each nostril daily Continue Xolair injections as scheduled  Echocardiogram ordered today - someone will contact you for scheduling  Home sleep study - someone will contact you for scheduling  Labs today  Follow up in 4-6 weeks with Dr. Lamonte Sakai or Alanson Aly. If symptoms do not improve or worsen, please contact office for sooner follow up or seek emergency care.

## 2022-06-28 NOTE — Patient Instructions (Signed)
Trial Breztri 2 puffs Twice daily. Brush tongue and rinse mouth afterwards. If you notice a difference, please call me and I will send in a new prescription. If you don't notice any difference, you can resume the Symbicort 2 puffs daily Continue flonase 2 sprays each nostril daily Continue Xolair injections as scheduled  Echocardiogram ordered today - someone will contact you for scheduling  Home sleep study - someone will contact you for scheduling  Labs today  Follow up in 4-6 weeks with Dr. Lamonte Sakai or Alanson Aly. If symptoms do not improve or worsen, please contact office for sooner follow up or seek emergency care.

## 2022-06-29 NOTE — Progress Notes (Signed)
Please notify patient that her hgb was low at 10.5. This could be contributing to her SOB and decreased diffusion capacity. She should follow up with her PCP to have iron studies and determine treatment. I still want her to have echo and HST. Thanks.

## 2022-06-30 ENCOUNTER — Encounter: Payer: Self-pay | Admitting: Nurse Practitioner

## 2022-06-30 DIAGNOSIS — R011 Cardiac murmur, unspecified: Secondary | ICD-10-CM | POA: Insufficient documentation

## 2022-06-30 DIAGNOSIS — G4719 Other hypersomnia: Secondary | ICD-10-CM | POA: Insufficient documentation

## 2022-06-30 NOTE — Assessment & Plan Note (Signed)
Systolic murmur right sternal border. See above

## 2022-06-30 NOTE — Assessment & Plan Note (Signed)
She has snoring, excessive daytime sleepiness, restless sleep. She also has persistent dyspnea during the day of uncertain etiology. Question if this is related to untreated OSA? Given this,  I am concerned she could have sleep disordered breathing with obstructive sleep apnea. She will need sleep study for further evaluation.    - discussed how weight can impact sleep and risk for sleep disordered breathing - discussed options to assist with weight loss: combination of diet modification, cardiovascular and strength training exercises   - had an extensive discussion regarding the adverse health consequences related to untreated sleep disordered breathing - specifically discussed the risks for hypertension, coronary artery disease, cardiac dysrhythmias, cerebrovascular disease, and diabetes - lifestyle modification discussed   - discussed how sleep disruption can increase risk of accidents, particularly when driving - safe driving practices were discussed

## 2022-07-04 DIAGNOSIS — L501 Idiopathic urticaria: Secondary | ICD-10-CM | POA: Diagnosis not present

## 2022-07-06 ENCOUNTER — Ambulatory Visit (INDEPENDENT_AMBULATORY_CARE_PROVIDER_SITE_OTHER): Payer: Medicare PPO

## 2022-07-06 DIAGNOSIS — G4719 Other hypersomnia: Secondary | ICD-10-CM

## 2022-07-06 DIAGNOSIS — R0602 Shortness of breath: Secondary | ICD-10-CM

## 2022-07-06 DIAGNOSIS — Z79899 Other long term (current) drug therapy: Secondary | ICD-10-CM | POA: Diagnosis not present

## 2022-07-06 DIAGNOSIS — R351 Nocturia: Secondary | ICD-10-CM

## 2022-07-06 DIAGNOSIS — E039 Hypothyroidism, unspecified: Secondary | ICD-10-CM | POA: Diagnosis not present

## 2022-07-06 DIAGNOSIS — M810 Age-related osteoporosis without current pathological fracture: Secondary | ICD-10-CM | POA: Diagnosis not present

## 2022-07-11 DIAGNOSIS — Z79899 Other long term (current) drug therapy: Secondary | ICD-10-CM | POA: Diagnosis not present

## 2022-07-11 DIAGNOSIS — M810 Age-related osteoporosis without current pathological fracture: Secondary | ICD-10-CM | POA: Diagnosis not present

## 2022-07-13 ENCOUNTER — Telehealth: Payer: Self-pay | Admitting: Nurse Practitioner

## 2022-07-14 ENCOUNTER — Other Ambulatory Visit: Payer: Self-pay

## 2022-07-14 ENCOUNTER — Telehealth: Payer: Self-pay | Admitting: Nurse Practitioner

## 2022-07-14 MED ORDER — BUDESONIDE-FORMOTEROL FUMARATE 160-4.5 MCG/ACT IN AERO: 2.0000 | INHALATION_SPRAY | Freq: Every day | RESPIRATORY_TRACT | 6 refills | Status: DC

## 2022-07-14 NOTE — Telephone Encounter (Signed)
ATC X1 Lvm for patient to call the office back

## 2022-07-14 NOTE — Telephone Encounter (Signed)
Thank you :)

## 2022-07-14 NOTE — Telephone Encounter (Signed)
Dr. Lamonte Sakai just an [place] pt has stopped taking Breztri she advises she experienced severe legs cramps for it. She is going to start back on Symbicort and I have sent her in refills for that.

## 2022-07-16 ENCOUNTER — Other Ambulatory Visit: Payer: Self-pay

## 2022-07-16 ENCOUNTER — Emergency Department (HOSPITAL_BASED_OUTPATIENT_CLINIC_OR_DEPARTMENT_OTHER)
Admission: EM | Admit: 2022-07-16 | Discharge: 2022-07-16 | Discharge status: Against Medical Advice | Payer: Medicare PPO | Attending: Emergency Medicine | Admitting: Emergency Medicine

## 2022-07-16 DIAGNOSIS — R197 Diarrhea, unspecified: Secondary | ICD-10-CM | POA: Insufficient documentation

## 2022-07-16 DIAGNOSIS — R110 Nausea: Secondary | ICD-10-CM | POA: Insufficient documentation

## 2022-07-16 DIAGNOSIS — Z5321 Procedure and treatment not carried out due to patient leaving prior to being seen by health care provider: Secondary | ICD-10-CM | POA: Insufficient documentation

## 2022-07-16 LAB — URINALYSIS, ROUTINE W REFLEX MICROSCOPIC
Bilirubin Urine: NEGATIVE
Glucose, UA: NEGATIVE mg/dL
Hgb urine dipstick: NEGATIVE
Ketones, ur: NEGATIVE mg/dL
Nitrite: NEGATIVE
Specific Gravity, Urine: 1.026 (ref 1.005–1.030)
pH: 5 (ref 5.0–8.0)

## 2022-07-16 LAB — CBC
HCT: 31.6 % — ABNORMAL LOW (ref 36.0–46.0)
Hemoglobin: 10 g/dL — ABNORMAL LOW (ref 12.0–15.0)
MCH: 24.7 pg — ABNORMAL LOW (ref 26.0–34.0)
MCHC: 31.6 g/dL (ref 30.0–36.0)
MCV: 78 fL — ABNORMAL LOW (ref 80.0–100.0)
Platelets: 344 10*3/uL (ref 150–400)
RBC: 4.05 MIL/uL (ref 3.87–5.11)
RDW: 15.7 % — ABNORMAL HIGH (ref 11.5–15.5)
WBC: 6.8 10*3/uL (ref 4.0–10.5)
nRBC: 0 % (ref 0.0–0.2)

## 2022-07-16 LAB — COMPREHENSIVE METABOLIC PANEL
ALT: 25 U/L (ref 0–44)
AST: 32 U/L (ref 15–41)
Albumin: 4 g/dL (ref 3.5–5.0)
Alkaline Phosphatase: 56 U/L (ref 38–126)
Anion gap: 8 (ref 5–15)
BUN: 19 mg/dL (ref 8–23)
CO2: 24 mmol/L (ref 22–32)
Calcium: 9 mg/dL (ref 8.9–10.3)
Chloride: 107 mmol/L (ref 98–111)
Creatinine, Ser: 0.77 mg/dL (ref 0.44–1.00)
GFR, Estimated: >60 mL/min (ref >60)
Glucose, Bld: 107 mg/dL — ABNORMAL HIGH (ref 70–99)
Potassium: 4 mmol/L (ref 3.5–5.1)
Sodium: 139 mmol/L (ref 135–145)
Total Bilirubin: 0.3 mg/dL (ref 0.3–1.2)
Total Protein: 6.7 g/dL (ref 6.5–8.1)

## 2022-07-16 LAB — LIPASE, BLOOD: Lipase: 45 U/L (ref 11–51)

## 2022-07-16 NOTE — ED Notes (Signed)
Pt called x3. Not visible in the lobby

## 2022-07-16 NOTE — ED Triage Notes (Signed)
Pt presents to ED Pov. Pt c/o diarrhea and nausea x7d. Pt reports that she has been taking OTC anti-diarrhea that has helped but not resolved.

## 2022-07-17 DIAGNOSIS — D509 Iron deficiency anemia, unspecified: Secondary | ICD-10-CM | POA: Diagnosis not present

## 2022-07-17 DIAGNOSIS — E039 Hypothyroidism, unspecified: Secondary | ICD-10-CM | POA: Diagnosis not present

## 2022-07-17 DIAGNOSIS — C7A090 Malignant carcinoid tumor of the bronchus and lung: Secondary | ICD-10-CM | POA: Diagnosis not present

## 2022-07-17 DIAGNOSIS — I700 Atherosclerosis of aorta: Secondary | ICD-10-CM | POA: Diagnosis not present

## 2022-07-17 DIAGNOSIS — R0609 Other forms of dyspnea: Secondary | ICD-10-CM | POA: Diagnosis not present

## 2022-07-26 DIAGNOSIS — Z5112 Encounter for antineoplastic immunotherapy: Secondary | ICD-10-CM | POA: Diagnosis not present

## 2022-07-26 DIAGNOSIS — M810 Age-related osteoporosis without current pathological fracture: Secondary | ICD-10-CM | POA: Diagnosis not present

## 2022-07-31 ENCOUNTER — Ambulatory Visit (HOSPITAL_COMMUNITY)
Admission: RE | Admit: 2022-07-31 | Discharge: 2022-07-31 | Disposition: A | Discharge status: Home / Self Care | Payer: Medicare PPO | Source: Ambulatory Visit | Attending: Nurse Practitioner | Admitting: Nurse Practitioner

## 2022-07-31 DIAGNOSIS — R0602 Shortness of breath: Secondary | ICD-10-CM | POA: Insufficient documentation

## 2022-07-31 DIAGNOSIS — E785 Hyperlipidemia, unspecified: Secondary | ICD-10-CM | POA: Insufficient documentation

## 2022-07-31 DIAGNOSIS — I350 Nonrheumatic aortic (valve) stenosis: Secondary | ICD-10-CM | POA: Diagnosis not present

## 2022-07-31 DIAGNOSIS — R011 Cardiac murmur, unspecified: Secondary | ICD-10-CM | POA: Diagnosis not present

## 2022-07-31 DIAGNOSIS — I10 Essential (primary) hypertension: Secondary | ICD-10-CM | POA: Insufficient documentation

## 2022-07-31 DIAGNOSIS — R0600 Dyspnea, unspecified: Secondary | ICD-10-CM | POA: Diagnosis not present

## 2022-07-31 LAB — ECHOCARDIOGRAM COMPLETE
Area-P 1/2: 2.5 cm2
Calc EF: 62 %
S' Lateral: 2.9 cm
Single Plane A2C EF: 61.5 %
Single Plane A4C EF: 60.7 %

## 2022-07-31 NOTE — Progress Notes (Signed)
Echocardiogram 2D Echocardiogram has been performed.  Fidel Levy 07/31/2022, 9:43 AM

## 2022-08-01 DIAGNOSIS — L501 Idiopathic urticaria: Secondary | ICD-10-CM | POA: Diagnosis not present

## 2022-08-02 DIAGNOSIS — D509 Iron deficiency anemia, unspecified: Secondary | ICD-10-CM | POA: Diagnosis not present

## 2022-08-02 DIAGNOSIS — R0609 Other forms of dyspnea: Secondary | ICD-10-CM | POA: Diagnosis not present

## 2022-08-02 DIAGNOSIS — R5383 Other fatigue: Secondary | ICD-10-CM | POA: Diagnosis not present

## 2022-08-03 ENCOUNTER — Other Ambulatory Visit: Payer: Medicare PPO

## 2022-08-08 ENCOUNTER — Ambulatory Visit: Payer: Medicare PPO | Admitting: Internal Medicine

## 2022-08-09 ENCOUNTER — Other Ambulatory Visit: Payer: Self-pay | Admitting: Nurse Practitioner

## 2022-08-09 DIAGNOSIS — M810 Age-related osteoporosis without current pathological fracture: Secondary | ICD-10-CM

## 2022-08-15 ENCOUNTER — Ambulatory Visit (INDEPENDENT_AMBULATORY_CARE_PROVIDER_SITE_OTHER): Payer: Medicare PPO | Admitting: Podiatry

## 2022-08-15 ENCOUNTER — Ambulatory Visit (INDEPENDENT_AMBULATORY_CARE_PROVIDER_SITE_OTHER): Payer: Medicare PPO

## 2022-08-15 DIAGNOSIS — R52 Pain, unspecified: Secondary | ICD-10-CM | POA: Diagnosis not present

## 2022-08-15 DIAGNOSIS — M7752 Other enthesopathy of left foot: Secondary | ICD-10-CM

## 2022-08-15 MED ORDER — METHYLPREDNISOLONE 4 MG PO TBPK: ORAL_TABLET | ORAL | 0 refills | Status: DC

## 2022-08-15 NOTE — Patient Instructions (Signed)
Achilles Tendinitis  with Rehab Achilles tendinitis is a disorder of the Achilles tendon. The Achilles tendon connects the large calf muscles (Gastrocnemius and Soleus) to the heel bone (calcaneus). This tendon is sometimes called the heel cord. It is important for pushing-off and standing on your toes and is important for walking, running, or jumping. Tendinitis is often caused by overuse and repetitive microtrauma. SYMPTOMS  Pain, tenderness, swelling, warmth, and redness may occur over the Achilles tendon even at rest.  Pain with pushing off, or flexing or extending the ankle.  Pain that is worsened after or during activity. CAUSES   Overuse sometimes seen with rapid increase in exercise programs or in sports requiring running and jumping.  Poor physical conditioning (strength and flexibility or endurance).  Running sports, especially training running down hills.  Inadequate warm-up before practice or play or failure to stretch before participation.  Injury to the tendon. PREVENTION   Warm up and stretch before practice or competition.  Allow time for adequate rest and recovery between practices and competition.  Keep up conditioning.  Keep up ankle and leg flexibility.  Improve or keep muscle strength and endurance.  Improve cardiovascular fitness.  Use proper technique.  Use proper equipment (shoes, skates).  To help prevent recurrence, taping, protective strapping, or an adhesive bandage may be recommended for several weeks after healing is complete. PROGNOSIS   Recovery may take weeks to several months to heal.  Longer recovery is expected if symptoms have been prolonged.  Recovery is usually quicker if the inflammation is due to a direct blow as compared with overuse or sudden strain. RELATED COMPLICATIONS   Healing time will be prolonged if the condition is not correctly treated. The injury must be given plenty of time to heal.  Symptoms can reoccur if  activity is resumed too soon.  Untreated, tendinitis may increase the risk of tendon rupture requiring additional time for recovery and possibly surgery. TREATMENT   The first treatment consists of rest anti-inflammatory medication, and ice to relieve the pain.  Stretching and strengthening exercises after resolution of pain will likely help reduce the risk of recurrence. Referral to a physical therapist or athletic trainer for further evaluation and treatment may be helpful.  A walking boot or cast may be recommended to rest the Achilles tendon. This can help break the cycle of inflammation and microtrauma.  Arch supports (orthotics) may be prescribed or recommended by your caregiver as an adjunct to therapy and rest.  Surgery to remove the inflamed tendon lining or degenerated tendon tissue is rarely necessary and has shown less than predictable results. MEDICATION   Nonsteroidal anti-inflammatory medications, such as aspirin and ibuprofen, may be used for pain and inflammation relief. Do not take within 7 days before surgery. Take these as directed by your caregiver. Contact your caregiver immediately if any bleeding, stomach upset, or signs of allergic reaction occur. Other minor pain relievers, such as acetaminophen, may also be used.  Pain relievers may be prescribed as necessary by your caregiver. Do not take prescription pain medication for longer than 4 to 7 days. Use only as directed and only as much as you need.  Cortisone injections are rarely indicated. Cortisone injections may weaken tendons and predispose to rupture. It is better to give the condition more time to heal than to use them. HEAT AND COLD  Cold is used to relieve pain and reduce inflammation for acute and chronic Achilles tendinitis. Cold should be applied for 10 to 15 minutes   every 2 to 3 hours for inflammation and pain and immediately after any activity that aggravates your symptoms. Use ice packs or an ice  massage.  Heat may be used before performing stretching and strengthening activities prescribed by your caregiver. Use a heat pack or a warm soak. SEEK MEDICAL CARE IF:  Symptoms get worse or do not improve in 2 weeks despite treatment.  New, unexplained symptoms develop. Drugs used in treatment may produce side effects.  EXERCISES:  RANGE OF MOTION (ROM) AND STRETCHING EXERCISES - Achilles Tendinitis  These exercises may help you when beginning to rehabilitate your injury. Your symptoms may resolve with or without further involvement from your physician, physical therapist or athletic trainer. While completing these exercises, remember:   Restoring tissue flexibility helps normal motion to return to the joints. This allows healthier, less painful movement and activity.  An effective stretch should be held for at least 30 seconds.  A stretch should never be painful. You should only feel a gentle lengthening or release in the stretched tissue.  STRETCH  Gastroc, Standing   Place hands on wall.  Extend right / left leg, keeping the front knee somewhat bent.  Slightly point your toes inward on your back foot.  Keeping your right / left heel on the floor and your knee straight, shift your weight toward the wall, not allowing your back to arch.  You should feel a gentle stretch in the right / left calf. Hold this position for 10 seconds. Repeat 3 times. Complete this stretch 2 times per day.  STRETCH  Soleus, Standing   Place hands on wall.  Extend right / left leg, keeping the other knee somewhat bent.  Slightly point your toes inward on your back foot.  Keep your right / left heel on the floor, bend your back knee, and slightly shift your weight over the back leg so that you feel a gentle stretch deep in your back calf.  Hold this position for 10 seconds. Repeat 3 times. Complete this stretch 2 times per day.  STRETCH  Gastrocsoleus, Standing  Note: This exercise can place  a lot of stress on your foot and ankle. Please complete this exercise only if specifically instructed by your caregiver.   Place the ball of your right / left foot on a step, keeping your other foot firmly on the same step.  Hold on to the wall or a rail for balance.  Slowly lift your other foot, allowing your body weight to press your heel down over the edge of the step.  You should feel a stretch in your right / left calf.  Hold this position for 10 seconds.  Repeat this exercise with a slight bend in your knee. Repeat 3 times. Complete this stretch 2 times per day.   STRENGTHENING EXERCISES - Achilles Tendinitis These exercises may help you when beginning to rehabilitate your injury. They may resolve your symptoms with or without further involvement from your physician, physical therapist or athletic trainer. While completing these exercises, remember:   Muscles can gain both the endurance and the strength needed for everyday activities through controlled exercises.  Complete these exercises as instructed by your physician, physical therapist or athletic trainer. Progress the resistance and repetitions only as guided.  You may experience muscle soreness or fatigue, but the pain or discomfort you are trying to eliminate should never worsen during these exercises. If this pain does worsen, stop and make certain you are following the directions exactly. If   push your toes away from you, pointing them downward. Hold this position for 10 seconds. Return slowly, controlling the tension in the band/tubing. Repeat 3 times. Complete this exercise 2 times per day.   STRENGTH - Plantar-flexors  Stand with your feet shoulder width apart. Steady yourself with a wall or table  using as little support as needed. Keeping your weight evenly spread over the width of your feet, rise up on your toes.* Hold this position for 10 seconds. Repeat 3 times. Complete this exercise 2 times per day.  *If this is too easy, shift your weight toward your right / left leg until you feel challenged. Ultimately, you may be asked to do this exercise with your right / left foot only.  STRENGTH  Plantar-flexors, Eccentric  Note: This exercise can place a lot of stress on your foot and ankle. Please complete this exercise only if specifically instructed by your caregiver.  Place the balls of your feet on a step. With your hands, use only enough support from a wall or rail to keep your balance. Keep your knees straight and rise up on your toes. Slowly shift your weight entirely to your right / left toes and pick up your opposite foot. Gently and with controlled movement, lower your weight through your right / left foot so that your heel drops below the level of the step. You will feel a slight stretch in the back of your calf at the end position. Use the healthy leg to help rise up onto the balls of both feet, then lower weight only on the right / left leg again. Build up to 15 repetitions. Then progress to 3 consecutive sets of 15 repetitions.* After completing the above exercise, complete the same exercise with a slight knee bend (about 30 degrees). Again, build up to 15 repetitions. Then progress to 3 consecutive sets of 15 repetitions.* Perform this exercise 2 times per day.  *When you easily complete 3 sets of 15, your physician, physical therapist or athletic trainer may advise you to add resistance by wearing a backpack filled with additional weight.  STRENGTH - Plantar Flexors, Seated  Sit on a chair that allows your feet to rest flat on the ground. If necessary, sit at the edge of the chair. Keeping your toes firmly on the ground, lift your right / left heel as far as you can without  increasing any discomfort in your ankle. Repeat 3 times. Complete this exercise 2 times a day.   ---  Posterior Tibial Tendon Tear Rehab Ask your health care provider which exercises are safe for you. Do exercises exactly as told by your health care provider and adjust them as directed. It is normal to feel mild stretching, pulling, tightness, or discomfort as you do these exercises. Stop right away if you feel sudden pain or your pain gets worse. Do not begin these exercises until told by your health care provider. Stretching and range-of-motion exercises These exercises warm up your muscles and joints and improve the movement and flexibility of your ankle. These exercises also help to relieve pain, numbness, and tingling. Gastroc stretch  Sit on the floor with your left / right leg extended. Loop a belt or towel around ball of your left / right foot. The ball of your foot is on the walking surface, right under your toes. Keep your left / right ankle and foot relaxed and keep your knee straight while you use the belt or towel to pull your foot and  ankle toward you. You should feel a gentle stretch behind your calf or knee (gastrocnemius). Hold this position for __________ seconds. Repeat __________ times. Complete this exercise __________ times a day. Active ankle dorsiflexion and plantar flexion  Sit with your left / right knee straight or bent. Do not rest your foot on anything. Flex your left / right ankle to tilt the top of your foot toward your shin (dorsiflexion). Hold this position for __________ seconds. Point your toes downward to tilt the top of your foot away from your shin (plantar flexion). Hold this position for __________ seconds. Repeat __________ times with your knee straight and __________ times with your knee bent. Complete this exercise __________ times a day. Passive ankle plantar flexion  Sit with your left / right leg crossed over your opposite knee. With your left  / right hand, pull the front of your foot and toes toward you (plantar flexion). You should feel a gentle stretch on the top of your foot and ankle. Hold this position for __________ seconds. Repeat __________ times. Complete this exercise __________ times a day. Passive ankle eversion  Sit with your left / right ankle crossed over your opposite knee. With your left / right hand, hold your foot so that your thumb is on the top of your foot and your fingers are on the bottom of your foot. Gently push and twist your ankle downward (eversion) so the smallest toes rise slightly toward the ceiling. You should feel a gentle stretch on the inside of your ankle. Hold this stretch for __________ seconds. Repeat __________ times. Complete this exercise __________ times a day. Passive ankle inversion  Sit with your left / right ankle crossed over your opposite knee. With your left / right hand, hold your foot so that your thumb is on the bottom of your foot and your fingers are across the top of your foot. Gently pull and twist your foot so the smallest toe comes toward you (inversion). You should feel a gentle stretch on the outside of your ankle. Hold the stretch for __________ seconds. Repeat __________ times. Complete this exercise __________ times a day. Ankle alphabet  Sit with your left / right leg supported at the lower leg. Do not rest your foot on anything. Make sure your foot has room to move freely. Think of your left / right foot as a paintbrush, and move your foot to trace each letter of the alphabet in the air. Keep your hip and knee still while you trace. Trace every letter of the alphabet. Repeat __________ times. Complete this exercise __________ times a day. Strengthening exercises These exercises build strength and endurance in your lower leg. Endurance is the ability to use your muscles for a long time, even after they get tired. Dorsiflexion  Secure a rubber exercise band or  tube to an object that will not move if it is pulled on, such as a table leg. Secure the other end of the band around your left / right foot. Sit on the floor, facing the object with your left / right leg extended. The band or tube should be slightly tense when your foot is relaxed. Slowly flex your left / right ankle and toes to bring your foot toward you (dorsiflexion). Hold this position for __________ seconds. Let the band or tube slowly pull your foot back to the starting position. Repeat __________ times. Complete this exercise __________ times a day. Plantar flexion while seated  Sit on the floor with your  left / right leg extended. Loop a rubber exercise band or tube around the ball of your left / right foot. The ball of your foot is on the walking surface, right under your toes. The band or tube should be slightly tense when your foot is relaxed. Slowly point your toes downward, pushing them away from you (plantar flexion). Hold this position for __________ seconds. Let the band or tube slowly pull your foot back to the starting position. Repeat __________ times. Complete this exercise __________ times a day. Towel curls  Sit in a chair on a non-carpeted surface, and put your feet on the floor. Place a towel in front of your feet. If told by your health care provider, add __________ to the end of the towel. Keeping your heel on the floor, put your left / right foot on the towel. Pull the towel toward you by grabbing the towel with your toes and curling them under. Keep your heel on the floor. Repeat __________ times. Complete this exercise __________ times a day. This information is not intended to replace advice given to you by your health care provider. Make sure you discuss any questions you have with your health care provider. Document Revised: 08/30/2018 Document Reviewed: 06/26/2018 [employer] Patient Education  [place].

## 2022-08-15 NOTE — Progress Notes (Unsigned)
Subjective: Chief Complaint  Patient presents with   Ankle Pain    Patient pain located in the left ankle, has had previous problems with ankle but pain resurfaced about 1 month ago and has become increasingly worse     Shje supinates and walks on the outside of hte foot. She got [employer] orthotics and that helped. She is getting pain along the achilles tendon area. When she is WB and moving her ankle one way or another it hurts. This started about 1 month and getting worse. No injuries. No swelling. Some n/t in the arch. She has tried lidoacine patche, brace. The brace helped some but when she took it off hte pain came back.    Objective: AAO x3, NAD DP/PT pulses palpable bilaterally, CRT less than 3 seconds Protective sensation intact with Simms Weinstein monofilament, vibratory sensation intact, Achilles tendon reflex intact No areas of pinpoint bony tenderness or pain with vibratory sensation. MMT 5/5, ROM WNL. No edema, erythema, increase in warmth to bilateral lower extremities.  No open lesions or pre-ulcerative lesions.  No pain with calf compression, swelling, warmth, erythema  Assessment:  Plan: -All treatment options discussed with the patient including all alternatives, risks, complications.   -Patient encouraged to call the office with any questions, concerns, change in symptoms.

## 2022-08-29 DIAGNOSIS — L501 Idiopathic urticaria: Secondary | ICD-10-CM | POA: Diagnosis not present

## 2022-09-26 DIAGNOSIS — L501 Idiopathic urticaria: Secondary | ICD-10-CM | POA: Diagnosis not present

## 2022-10-09 NOTE — Progress Notes (Unsigned)
Cardiology Office Note:    Date:  10/12/2022   ID:  Cynthia Rhodes, DOB Apr 06, 1946, MRN [id number]  PCP:  Koren Shiver, DO  [facility] Cardiologist:  Meriam Sprague, MD  [facility] Electrophysiologist:  None   Referring MD: Koren Shiver, DO    History of Present Illness:    Cynthia Rhodes is a 77 y.o. 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  During our visit on 03/01/20, she was seen for incidental asymptomatic bradycardia. She was not on nodal agents at that time and we decided to continue to monitor.   Had admission to [facility] 04/2020 with left anterior chest pain radiating to left neck and shoulder x2 days after going to the chiropracter. There,  Initial ECG with TWI in V1 and V2. Repeat showed TWI in V1. hsTnI normal at 5-->5. ESR normal, CRP elevated at 2.0. TSH normal. Tchol not at goal, LDL 124. A1c 6.0. TTE with normal BiV function. Thought to be noncardiac in nature as pain reproducible with palpation. She was recommended for symptomatic management and discharged home. Of note, she had very elevated blood pressure and was started on amlodipine prior to discharge  Seen 03/21/2022 where she was having some issues with shortness of breath on exertion.Coronary CTA 03/2022  showed minimal disease with Ca score 14.7 (30%). Cardiac monitor showed NSR with 23 runs of brief SVT, rare ectopy. Patient triggered events correlated with sinus/sinus tach.  Was last seen in clinic on 04/2022 where she continued to struggle with her breathing. She was planned to see Pulm for further evaluation.  Today, the patient overall feels well. Her breathing has improved but this worsens with allergy season and humidity. Fatigue has also improved. No chest pain, LE edema, orthopnea, or palpitations. Blood pressure is well  controlled. Tolerating medications as prescribed.   Past Medical History:  Diagnosis Date   Cancer (HCC) 2021   History of hiatal hernia    Hypothyroidism    Peripheral vascular disease (HCC)    varicose veins    Past Surgical History:  Procedure Laterality Date   ABDOMINAL HYSTERECTOMY     APPENDECTOMY  1954   BRONCHIAL BIOPSY  11/04/2019   Procedure: BRONCHIAL BIOPSIES;  Surgeon: Leslye Peer, MD;  Location: [facility] ENDOSCOPY;  Service: Pulmonary;;   BRONCHIAL BRUSHINGS  11/04/2019   Procedure: BRONCHIAL BRUSHINGS;  Surgeon: Leslye Peer, MD;  Location: [facility] ENDOSCOPY;  Service: Pulmonary;;   BRONCHIAL NEEDLE ASPIRATION BIOPSY  11/04/2019   Procedure: BRONCHIAL NEEDLE ASPIRATION BIOPSIES;  Surgeon: Leslye Peer, MD;  Location: [facility] ENDOSCOPY;  Service: Pulmonary;;   BRONCHIAL WASHINGS  11/04/2019   Procedure: BRONCHIAL WASHINGS;  Surgeon: Leslye Peer, MD;  Location: [facility] ENDOSCOPY;  Service: Pulmonary;;   BUNIONECTOMY Bilateral    CHOLECYSTECTOMY     COLON SURGERY  1954   EYE SURGERY Bilateral 2021   INTERCOSTAL NERVE BLOCK Right 01/15/2020   Procedure: INTERCOSTAL NERVE BLOCK;  Surgeon: Loreli Slot, MD;  Location: [facility] OR;  Service: Thoracic;  Laterality: Right;   JOINT REPLACEMENT Bilateral    Knee   KNEE SURGERY Bilateral    LYMPH NODE DISSECTION N/A 01/15/2020   Procedure: LYMPH NODE DISSECTION;  Surgeon: Loreli Slot, MD;  Location: [facility] OR;  Service: Thoracic;  Laterality: N/A;   REPLACEMENT TOTAL KNEE BILATERAL  SIGMOIDECTOMY     TONSILLECTOMY     VIDEO BRONCHOSCOPY WITH ENDOBRONCHIAL NAVIGATION N/A 11/04/2019   Procedure: VIDEO BRONCHOSCOPY WITH ENDOBRONCHIAL NAVIGATION;  Surgeon: Leslye Peer, MD;  Location: [facility] ENDOSCOPY;  Service: Pulmonary;  Laterality: N/A;   VIDEO BRONCHOSCOPY WITH ENDOBRONCHIAL NAVIGATION N/A 01/15/2020   Procedure: VIDEO BRONCHOSCOPY WITH ENDOBRONCHIAL NAVIGATION to mark right upper lobe nodule;  Surgeon: Loreli Slot,  MD;  Location: [facility] OR;  Service: Thoracic;  Laterality: N/A;    Current Medications: Current Meds  Medication Sig   acetaminophen (TYLENOL) 650 MG CR tablet Take 1,300 mg by mouth every 8 (eight) hours as needed for pain.   amLODipine (NORVASC) 10 MG tablet Take 10 mg by mouth daily.   B Complex Vitamins (B COMPLEX VITAMIN PO) Take 1 tablet by mouth daily.   budesonide-formoterol (SYMBICORT) 160-4.5 MCG/ACT inhaler Inhale 2 puffs into the lungs daily.   Calcium Carb-Cholecalciferol (CALCIUM + D3 PO) Take 1 tablet by mouth daily.    cholecalciferol (VITAMIN D3) 25 MCG (1000 UNIT) tablet Take 1,000 Units by mouth daily.   denosumab (PROLIA) 60 MG/ML SOSY injection Inject 60 mg into the skin every 6 (six) months.   EPINEPHrine 0.3 mg/0.3 mL IJ SOAJ injection See admin instructions.   ezetimibe (ZETIA) 10 MG tablet Take 1 tablet (10 mg total) by mouth daily.   FEROSUL 325 (65 Fe) MG tablet Take 325 mg by mouth 3 (three) times a week.   fluticasone (FLONASE) 50 MCG/ACT nasal spray SHAKE LIQUID AND USE 2 SPRAYS IN EACH NOSTRIL DAILY   levothyroxine (SYNTHROID) 50 MCG tablet Take 50 mcg by mouth every other day. Alternating with 75 mcg   levothyroxine (SYNTHROID) 75 MCG tablet Take 75 mcg by mouth every other day. Alternating with 50 mcg   magnesium oxide (MAG-OX) 400 MG tablet Take 400 mg by mouth daily.   Multiple Vitamins-Minerals (OCUVITE EYE HEALTH FORMULA PO) Take 1 tablet by mouth daily.   omalizumab Geoffry Paradise) 150 MG/ML prefilled syringe Inject 150 mg into the skin every 28 (twenty-eight) days.   OVER THE COUNTER MEDICATION Take 6 capsules by mouth daily. Balance of Nature Fruits & Veggies supplement (3 Veggies + 3 Fruit)     Allergies:   Codeine, Pravastatin, Rosuvastatin, Sulfa antibiotics, Sulfacetamide sodium-sulfur, and Voltaren [diclofenac sodium]   Social History   Socioeconomic History   Marital status: Widowed    Spouse name: Not on file   Number of children: Not on file    Years of education: Not on file   Highest education level: Not on file  Occupational History   Not on file  Tobacco Use   Smoking status: Never   Smokeless tobacco: Never  Vaping Use   Vaping Use: Never used  Substance and Sexual Activity   Alcohol use: Yes    Alcohol/week: 7.0 standard drinks of alcohol    Types: 7 Glasses of wine per week   Drug use: Never   Sexual activity: Not Currently  Other Topics Concern   Not on file  Social History Narrative   Not on file   Social Determinants of Health   Financial Resource Strain: Not on file  Food Insecurity: No Food Insecurity (02/13/2022)   Hunger Vital Sign    Worried About Running Out of Food in the Last Year: Never true    Ran Out of Food in the Last Year: Never true  Transportation Needs: No Transportation Needs (02/13/2022)   PRAPARE - Transportation    Lack of Transportation (  Medical): No    Lack of Transportation (Non-Medical): No  Physical Activity: Not on file  Stress: Not on file  Social Connections: Not on file     Family History: The patient's family history includes Asthma in her mother; Breast cancer in her mother.  ROS:   Please see the history of present illness.    Review of Systems  Constitutional:  Negative for chills and fever.  HENT:  Negative for hearing loss.   Eyes:  Negative for blurred vision.  Respiratory:  Positive for shortness of breath (on min.exertion).   Cardiovascular:  Positive for palpitations. Negative for chest pain, orthopnea, claudication, leg swelling and PND.  Gastrointestinal:  Negative for constipation, nausea and vomiting.  Genitourinary:  Negative for hematuria.  Musculoskeletal:  Negative for back pain and myalgias.  Neurological:  Negative for dizziness and loss of consciousness.  Endo/Heme/Allergies:  Negative for polydipsia.  Psychiatric/Behavioral:  Negative for depression.    EKGs/Labs/Other Studies Reviewed:    The following studies were reviewed today: Coronary  CTA 03/2022: FINDINGS: Image quality: Good   Noise artifact is: Limited   Coronary calcium score is 14.7, which places the patient in the 30th percentile for age and sex matched control.   Coronary arteries: Normal coronary origins.  Right dominance.   Right Coronary Artery: Minimal ostial mixed atherosclerotic plaque, <25% stenosis.   Left Main Coronary Artery: No detectable plaque or stenosis.   Left Anterior Descending Coronary Artery: Minimal mixed atherosclerotic plaque in the proximal and mid LAD, <25% stenosis.   Left Circumflex Artery: Minimal mixed atherosclerotic plaque in the proximal LCx, <25% stenosis.   Aorta: Normal size, 35 mm at the mid ascending aorta (level of the PA bifurcation) measured double oblique. Mild root calcifications. No dissection.   Aortic Valve: Mild calcifications. AV calcium score 181.   Other findings:   Normal pulmonary vein drainage into the left atrium.   Normal left atrial appendage without thrombus.   Normal size of the pulmonary artery.   Minimal mitral annular calcifications.   Please see separate report from [facility] for non-cardiac findings.   IMPRESSION: 1. 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   2. Coronary calcium score is 14.7, which places the patient in the 30th percentile for age and sex matched control.   3. Normal coronary origins with right dominance.   RECOMMENDATIONS: CAD-RADS 1. Minimal non-obstructive CAD (0-24%). Consider non-atherosclerotic causes of chest pain. Consider preventive therapy and risk factor modification.   Cardiac Monitor 03/2022:   Patch wear time was 3 days and 9 hours   Predominant rhythm was NSR with average HR 72bpm   23 runs of SVT with longest lasting 12.2s   Rare SVE (<1%), rare VE (<1%)   Patient triggered events mainly correlated with NSR/sinus tach. One episode correlated with brief run of SVT   No Afib or significant pauses     Patch Wear Time:  3 days  and 9 hours (2023-11-03T11:21:56-0400 to 2023-11-06T20:18:59-0500)   Patient had a min HR of 52 bpm, max HR of 169 bpm, and avg HR of 72 bpm. Predominant underlying rhythm was Sinus Rhythm. 23 Supraventricular Tachycardia runs occurred, the run with the fastest interval lasting 8 beats with a max rate of 169 bpm, the  longest lasting 12.2 secs with an avg rate of 130 bpm. Supraventricular Tachycardia was detected within +/- 45 seconds of symptomatic patient event(s). Isolated SVEs were rare (<1.0%), SVE Couplets were rare (<1.0%), and SVE Triplets were rare (<1.0%).  Isolated  VEs were rare (<1.0%), and no VE Couplets or VE Triplets were present. CT chest 09/2019: IMPRESSION: 1. 2.8 x 1.6 cm spiculated mass with pleural tail is noted in the right middle lobe consistent with primary malignancy. PET scan is recommended for further evaluation. These results will be called to the ordering clinician or representative by the Radiologist Assistant, and communication documented in the PACS or zVision Dashboard. 2. 7 x 5 mm nodule is noted in right upper lobe concerning for metastatic lesion. 3. Mild cardiomegaly.   ECG 01/13/20: Sinus braydcardia; no block.  Echo 04/25/20 1. Left ventricular ejection fraction, by estimation, is 65 to 70%. The  left ventricle has normal function. The left ventricle has no regional  wall motion abnormalities. Indeterminate diastolic filling due to E-A  fusion.   2. Right ventricular systolic function is normal. The right ventricular  size is normal. Tricuspid regurgitation signal is inadequate for assessing  PA pressure.   3. The mitral valve is normal in structure. No evidence of mitral valve  regurgitation. No evidence of mitral stenosis. Moderate mitral annular  calcification.   4. The aortic valve is grossly normal. There is mild calcification of the  aortic valve. There is mild thickening of the aortic valve. Aortic valve  regurgitation is not visualized.  Mild aortic valve sclerosis is present,  with no evidence of aortic valve  stenosis.   5. Pulmonic valve regurgitation not visualized.   6. The inferior vena cava is normal in size with greater than 50%  respiratory variability, suggesting right atrial pressure of 3 mmHg.   Comparison(s): No prior Echocardiogram  CTA chest 04/24/20: IMPRESSION: 1. Negative for pulmonary embolism. 2. Postsurgical changes in right lung with a small amount of new fluid or low-density in the right hilar region adjacent to the right middle lobectomy site. This fluid or low-density is nonspecific but appears to be new since 03/31/2020. Consider cardiothoracic consultation with regards to this finding. 3. No acute disease in the lungs. Stable small pulmonary nodules. Recommend attention to the small nodules on follow-up imaging.  Long Term Monitor 03/2022:   Patch wear time was 3 days and 9 hours   Predominant rhythm was NSR with average HR 72bpm   23 runs of SVT with longest lasting 12.2s   Rare SVE (<1%), rare VE (<1%)   Patient triggered events mainly correlated with NSR/sinus tach. One episode correlated with brief run of SVT   No Afib or significant pauses  CT Coronary Morph 04/04/2022: IMPRESSION: Postsurgical changes of prior right middle lobectomy for lung cancer. At the right middle lobe bronchus resection margin, there is new abnormal soft tissue density bordering the right heart. This remains suspicious for new right hilar lymphadenopathy versus local recurrence. Consider conventional CT of the chest for further evaluation. This is new compared to the most recent comparison CT 08/06/2021.   EKG:  No new tracing  Recent Labs: 07/16/2022: ALT 25; BUN 19; Creatinine, Ser 0.77; Hemoglobin 10.0; Platelets 344; Potassium 4.0; Sodium 139  Recent Lipid Panel    Component Value Date/Time   CHOL 207 (H) 03/22/2022 0854   TRIG 47 03/22/2022 0854   HDL 86 03/22/2022 0854   CHOLHDL 2.4  03/22/2022 0854   CHOLHDL 3.1 04/24/2020 2311   VLDL 15 04/24/2020 2311   LDLCALC 112 (H) 03/22/2022 0854      Physical Exam:    VS:  BP 138/80   Pulse 60   Ht 5\' 6"  (1.676 m)   Wt 151 lb  6.4 oz (68.7 kg)   SpO2 98%   BMI 24.44 kg/m     Wt Readings from Last 3 Encounters:  10/12/22 151 lb 6.4 oz (68.7 kg)  06/28/22 156 lb 12.8 oz (71.1 kg)  05/18/22 159 lb 3.2 oz (72.2 kg)     GEN:   Well nourished, well developed in no acute distress HEENT: Normal NECK: No JVD; No carotid bruits CARDIAC:  RRR, 2/6 systolic murmur. No rubs or gallops RESPIRATORY:  Clear to auscultation without rales, wheezing or rhonchi  ABDOMEN: Soft, non-tender, non-distended MUSCULOSKELETAL:  No edema, warm SKIN: Warm and dry NEUROLOGIC:  Alert and oriented x 3 PSYCHIATRIC:  Normal affect   ASSESSMENT:    1. Shortness of breath   2. Bradycardia   3. Primary hypertension   4. Pure hypercholesterolemia   5. Iron deficiency anemia, unspecified iron deficiency anemia type    PLAN:    In order of problems listed above:  #DOE: Likely related to underlying asthma. Cardiac work-up reassuring with TTE with normal BiV function, no significant valve disease. Coronary CTA with minimal CAD. Cardiac monitor with rare ectopy. Will continue scheduled follow-up with Dr. Delton Coombes.  #Asymptomatic Sinus bradycardia: No lightheadedness, dizziness, or syncope. No nodal agents.  -Reassuringly asymptomatic without evidence of block -Not on nodal agents -TSH normal on current synthroid dosing   #Hypertension Controlled at home mainly 120-130s.  -Continue amlodipine 10mg  daily  #HLD: Intolerant of statins. Declined repatha today. -Continue zetia 10mg  daily -Check lipids -Could consider PCSK9i pending LDL level   #RUL and RML carcinoid: S/p resection in 12/2019. -Follows with Dr. Arbutus Ped and Dr. Delton Coombes  #Aortic Sclerosis: -Noted on TTE without evidence of significant narrowing on my review  #Anemia: -HgB  10 -Had colonoscopy on 5/22 which was unrevealing -No other source of bleeding and not on blood thinners -Has been started on PO iron supplementation  Medication Adjustments/Labs and Tests Ordered: Current medicines are reviewed at length with the patient today.  Concerns regarding medicines are outlined above.  Orders Placed This Encounter  Procedures   Lipid Profile   CBC w/Diff   Iron, TIBC and Ferritin Panel   No orders of the defined types were placed in this encounter.  Patient Instructions  Medication Instructions:   Your physician recommends that you continue on your current medications as directed. Please refer to the Current Medication list given to you today.  *If you need a refill on your cardiac medications before your next appointment, please call your pharmacy*   Lab Work:  TODAY--LIPIDS, CBC W DIFF, FERRITIN PANEL , TIBC, IRON   If you have labs (blood work) drawn today and your tests are completely normal, you will receive your results only by: MyChart Message (if you have MyChart) OR A paper copy in the mail If you have any lab test that is abnormal or we need to change your treatment, we will call you to review the results.    Follow-Up: At [facility], you and your health needs are our priority.  As part of our continuing mission to provide you with exceptional heart care, we have created designated Provider Care Teams.  These Care Teams include your primary Cardiologist (physician) and Advanced Practice Providers (APPs -  Physician Assistants and Nurse Practitioners) who all work together to provide you with the care you need, when you need it.  We recommend signing up for the patient portal called "MyChart".  Sign up information is provided on this After Visit Summary.  MyChart is used to connect with patients for Virtual Visits (Telemedicine).  Patients are able to view lab/test results, encounter notes, upcoming appointments, etc.  Non-urgent  messages can be sent to your provider as well.   To learn more about what you can do with MyChart, go to [email or web address].    Your next appointment:   6 month(s)  Provider:   Jodelle Red, MD

## 2022-10-11 DIAGNOSIS — D124 Benign neoplasm of descending colon: Secondary | ICD-10-CM | POA: Diagnosis not present

## 2022-10-11 DIAGNOSIS — K635 Polyp of colon: Secondary | ICD-10-CM | POA: Diagnosis not present

## 2022-10-11 DIAGNOSIS — Z86010 Personal history of colonic polyps: Secondary | ICD-10-CM | POA: Diagnosis not present

## 2022-10-11 DIAGNOSIS — Z09 Encounter for follow-up examination after completed treatment for conditions other than malignant neoplasm: Secondary | ICD-10-CM | POA: Diagnosis not present

## 2022-10-11 DIAGNOSIS — D123 Benign neoplasm of transverse colon: Secondary | ICD-10-CM | POA: Diagnosis not present

## 2022-10-11 DIAGNOSIS — D125 Benign neoplasm of sigmoid colon: Secondary | ICD-10-CM | POA: Diagnosis not present

## 2022-10-12 ENCOUNTER — Ambulatory Visit: Payer: Medicare PPO | Attending: Cardiology | Admitting: Cardiology

## 2022-10-12 ENCOUNTER — Encounter: Payer: Self-pay | Admitting: Cardiology

## 2022-10-12 VITALS — BP 138/80 | HR 60 | Ht 66.0 in | Wt 151.4 lb

## 2022-10-12 DIAGNOSIS — E7800 Pure hypercholesterolemia, unspecified: Secondary | ICD-10-CM | POA: Diagnosis not present

## 2022-10-12 DIAGNOSIS — D509 Iron deficiency anemia, unspecified: Secondary | ICD-10-CM | POA: Diagnosis not present

## 2022-10-12 DIAGNOSIS — R0602 Shortness of breath: Secondary | ICD-10-CM

## 2022-10-12 DIAGNOSIS — I10 Essential (primary) hypertension: Secondary | ICD-10-CM

## 2022-10-12 DIAGNOSIS — R001 Bradycardia, unspecified: Secondary | ICD-10-CM

## 2022-10-12 DIAGNOSIS — E785 Hyperlipidemia, unspecified: Secondary | ICD-10-CM | POA: Diagnosis not present

## 2022-10-12 NOTE — Patient Instructions (Signed)
Medication Instructions:   Your physician recommends that you continue on your current medications as directed. Please refer to the Current Medication list given to you today.  *If you need a refill on your cardiac medications before your next appointment, please call your pharmacy*   Lab Work:  TODAY--LIPIDS, CBC W DIFF, FERRITIN PANEL , TIBC, IRON   If you have labs (blood work) drawn today and your tests are completely normal, you will receive your results only by: MyChart Message (if you have MyChart) OR A paper copy in the mail If you have any lab test that is abnormal or we need to change your treatment, we will call you to review the results.    Follow-Up: At [facility], you and your health needs are our priority.  As part of our continuing mission to provide you with exceptional heart care, we have created designated Provider Care Teams.  These Care Teams include your primary Cardiologist (physician) and Advanced Practice Providers (APPs -  Physician Assistants and Nurse Practitioners) who all work together to provide you with the care you need, when you need it.  We recommend signing up for the patient portal called "MyChart".  Sign up information is provided on this After Visit Summary.  MyChart is used to connect with patients for Virtual Visits (Telemedicine).  Patients are able to view lab/test results, encounter notes, upcoming appointments, etc.  Non-urgent messages can be sent to your provider as well.   To learn more about what you can do with MyChart, go to [email or web address].    Your next appointment:   6 month(s)  Provider:   Jodelle Red, MD

## 2022-10-13 DIAGNOSIS — K635 Polyp of colon: Secondary | ICD-10-CM | POA: Diagnosis not present

## 2022-10-13 DIAGNOSIS — D123 Benign neoplasm of transverse colon: Secondary | ICD-10-CM | POA: Diagnosis not present

## 2022-10-13 DIAGNOSIS — D125 Benign neoplasm of sigmoid colon: Secondary | ICD-10-CM | POA: Diagnosis not present

## 2022-10-13 DIAGNOSIS — D124 Benign neoplasm of descending colon: Secondary | ICD-10-CM | POA: Diagnosis not present

## 2022-10-13 LAB — CBC WITH DIFFERENTIAL/PLATELET
Basophils Absolute: 0.1 10*3/uL (ref 0.0–0.2)
Basos: 1 %
EOS (ABSOLUTE): 0.3 10*3/uL (ref 0.0–0.4)
Eos: 5 %
Hematocrit: 43.1 % (ref 34.0–46.6)
Hemoglobin: 13.6 g/dL (ref 11.1–15.9)
Immature Grans (Abs): 0 10*3/uL (ref 0.0–0.1)
Immature Granulocytes: 0 %
Lymphocytes Absolute: 1.7 10*3/uL (ref 0.7–3.1)
Lymphs: 23 %
MCH: 26.4 pg — ABNORMAL LOW (ref 26.6–33.0)
MCHC: 31.6 g/dL (ref 31.5–35.7)
MCV: 84 fL (ref 79–97)
Monocytes Absolute: 0.9 10*3/uL (ref 0.1–0.9)
Monocytes: 12 %
Neutrophils Absolute: 4.3 10*3/uL (ref 1.4–7.0)
Neutrophils: 59 %
Platelets: 311 10*3/uL (ref 150–450)
RBC: 5.15 x10E6/uL (ref 3.77–5.28)
RDW: 18.6 % — ABNORMAL HIGH (ref 11.7–15.4)
WBC: 7.3 10*3/uL (ref 3.4–10.8)

## 2022-10-13 LAB — IRON,TIBC AND FERRITIN PANEL
Ferritin: 55 ng/mL (ref 15–150)
Iron Saturation: 10 % — ABNORMAL LOW (ref 15–55)
Iron: 37 ug/dL (ref 27–139)
Total Iron Binding Capacity: 363 ug/dL (ref 250–450)
UIBC: 326 ug/dL (ref 118–369)

## 2022-10-13 LAB — LIPID PANEL
Chol/HDL Ratio: 2.5 ratio (ref 0.0–4.4)
Cholesterol, Total: 212 mg/dL — ABNORMAL HIGH (ref 100–199)
HDL: 84 mg/dL (ref >39)
LDL Chol Calc (NIH): 116 mg/dL — ABNORMAL HIGH (ref 0–99)
Triglycerides: 68 mg/dL (ref 0–149)
VLDL Cholesterol Cal: 12 mg/dL (ref 5–40)

## 2022-10-18 ENCOUNTER — Other Ambulatory Visit: Payer: Self-pay | Admitting: Emergency Medicine

## 2022-11-28 DIAGNOSIS — Z85110 Personal history of malignant carcinoid tumor of bronchus and lung: Secondary | ICD-10-CM | POA: Diagnosis not present

## 2022-11-28 DIAGNOSIS — D509 Iron deficiency anemia, unspecified: Secondary | ICD-10-CM | POA: Diagnosis not present

## 2022-11-28 DIAGNOSIS — D229 Melanocytic nevi, unspecified: Secondary | ICD-10-CM | POA: Diagnosis not present

## 2022-11-28 DIAGNOSIS — E039 Hypothyroidism, unspecified: Secondary | ICD-10-CM | POA: Diagnosis not present

## 2022-11-28 DIAGNOSIS — Z9181 History of falling: Secondary | ICD-10-CM | POA: Diagnosis not present

## 2022-12-04 DIAGNOSIS — H04123 Dry eye syndrome of bilateral lacrimal glands: Secondary | ICD-10-CM | POA: Diagnosis not present

## 2022-12-04 DIAGNOSIS — D3132 Benign neoplasm of left choroid: Secondary | ICD-10-CM | POA: Diagnosis not present

## 2022-12-04 DIAGNOSIS — Z961 Presence of intraocular lens: Secondary | ICD-10-CM | POA: Diagnosis not present

## 2022-12-04 DIAGNOSIS — H52203 Unspecified astigmatism, bilateral: Secondary | ICD-10-CM | POA: Diagnosis not present

## 2022-12-27 DIAGNOSIS — L501 Idiopathic urticaria: Secondary | ICD-10-CM | POA: Diagnosis not present

## 2023-01-10 DIAGNOSIS — Z79899 Other long term (current) drug therapy: Secondary | ICD-10-CM | POA: Diagnosis not present

## 2023-01-10 DIAGNOSIS — M810 Age-related osteoporosis without current pathological fracture: Secondary | ICD-10-CM | POA: Diagnosis not present

## 2023-01-10 DIAGNOSIS — E039 Hypothyroidism, unspecified: Secondary | ICD-10-CM | POA: Diagnosis not present

## 2023-01-16 DIAGNOSIS — Z79899 Other long term (current) drug therapy: Secondary | ICD-10-CM | POA: Diagnosis not present

## 2023-01-16 DIAGNOSIS — M810 Age-related osteoporosis without current pathological fracture: Secondary | ICD-10-CM | POA: Diagnosis not present

## 2023-01-16 LAB — BASIC METABOLIC PANEL WITH GFR
BUN: 18 (ref 4–21)
CO2: 23 — AB (ref 13–22)
Chloride: 104 (ref 99–108)
Creatinine: 0.8 (ref 0.5–1.1)
Glucose: 124
Potassium: 4.3 meq/L (ref 3.5–5.1)
Sodium: 140 (ref 137–147)

## 2023-01-16 LAB — HEPATIC FUNCTION PANEL
ALT: 31 U/L (ref 7–35)
AST: 39 — AB (ref 13–35)
Alkaline Phosphatase: 113 (ref 25–125)

## 2023-01-16 LAB — COMPREHENSIVE METABOLIC PANEL WITH GFR
Albumin: 4.2 (ref 3.5–5.0)
Calcium: 9.6 (ref 8.7–10.7)
Globulin: 2.4
eGFR: 74

## 2023-01-16 LAB — VITAMIN D 25 HYDROXY (VIT D DEFICIENCY, FRACTURES): Vit D, 25-Hydroxy: 38.9

## 2023-01-17 DIAGNOSIS — G4762 Sleep related leg cramps: Secondary | ICD-10-CM | POA: Diagnosis not present

## 2023-01-17 DIAGNOSIS — L659 Nonscarring hair loss, unspecified: Secondary | ICD-10-CM | POA: Diagnosis not present

## 2023-01-17 DIAGNOSIS — E039 Hypothyroidism, unspecified: Secondary | ICD-10-CM | POA: Diagnosis not present

## 2023-01-17 DIAGNOSIS — R7303 Prediabetes: Secondary | ICD-10-CM | POA: Diagnosis not present

## 2023-01-17 DIAGNOSIS — R351 Nocturia: Secondary | ICD-10-CM | POA: Diagnosis not present

## 2023-01-17 DIAGNOSIS — R5383 Other fatigue: Secondary | ICD-10-CM | POA: Diagnosis not present

## 2023-01-17 DIAGNOSIS — R9389 Abnormal findings on diagnostic imaging of other specified body structures: Secondary | ICD-10-CM | POA: Diagnosis not present

## 2023-01-23 DIAGNOSIS — R7303 Prediabetes: Secondary | ICD-10-CM | POA: Diagnosis not present

## 2023-01-23 DIAGNOSIS — R351 Nocturia: Secondary | ICD-10-CM | POA: Diagnosis not present

## 2023-01-23 DIAGNOSIS — R5383 Other fatigue: Secondary | ICD-10-CM | POA: Diagnosis not present

## 2023-01-23 DIAGNOSIS — L659 Nonscarring hair loss, unspecified: Secondary | ICD-10-CM | POA: Diagnosis not present

## 2023-01-23 DIAGNOSIS — E039 Hypothyroidism, unspecified: Secondary | ICD-10-CM | POA: Diagnosis not present

## 2023-01-25 DIAGNOSIS — R7303 Prediabetes: Secondary | ICD-10-CM | POA: Diagnosis not present

## 2023-01-25 DIAGNOSIS — E039 Hypothyroidism, unspecified: Secondary | ICD-10-CM | POA: Diagnosis not present

## 2023-01-25 DIAGNOSIS — R9389 Abnormal findings on diagnostic imaging of other specified body structures: Secondary | ICD-10-CM | POA: Diagnosis not present

## 2023-02-19 ENCOUNTER — Other Ambulatory Visit: Payer: Self-pay | Admitting: Nurse Practitioner

## 2023-02-19 ENCOUNTER — Inpatient Hospital Stay: Admission: RE | Admit: 2023-02-19 | Payer: Medicare PPO | Source: Ambulatory Visit

## 2023-02-19 DIAGNOSIS — M810 Age-related osteoporosis without current pathological fracture: Secondary | ICD-10-CM

## 2023-02-21 DIAGNOSIS — L814 Other melanin hyperpigmentation: Secondary | ICD-10-CM | POA: Diagnosis not present

## 2023-02-21 DIAGNOSIS — D1801 Hemangioma of skin and subcutaneous tissue: Secondary | ICD-10-CM | POA: Diagnosis not present

## 2023-02-21 DIAGNOSIS — L821 Other seborrheic keratosis: Secondary | ICD-10-CM | POA: Diagnosis not present

## 2023-02-26 DIAGNOSIS — L501 Idiopathic urticaria: Secondary | ICD-10-CM | POA: Diagnosis not present

## 2023-03-02 DIAGNOSIS — M810 Age-related osteoporosis without current pathological fracture: Secondary | ICD-10-CM | POA: Diagnosis not present

## 2023-03-05 ENCOUNTER — Other Ambulatory Visit: Payer: Self-pay | Admitting: Emergency Medicine

## 2023-03-06 DIAGNOSIS — Z23 Encounter for immunization: Secondary | ICD-10-CM | POA: Diagnosis not present

## 2023-03-06 DIAGNOSIS — M810 Age-related osteoporosis without current pathological fracture: Secondary | ICD-10-CM | POA: Diagnosis not present

## 2023-03-19 DIAGNOSIS — L6611 Classic lichen planopilaris: Secondary | ICD-10-CM | POA: Diagnosis not present

## 2023-04-02 DIAGNOSIS — R7303 Prediabetes: Secondary | ICD-10-CM | POA: Diagnosis not present

## 2023-04-02 DIAGNOSIS — I10 Essential (primary) hypertension: Secondary | ICD-10-CM | POA: Diagnosis not present

## 2023-04-02 DIAGNOSIS — Z0000 Encounter for general adult medical examination without abnormal findings: Secondary | ICD-10-CM | POA: Diagnosis not present

## 2023-04-02 DIAGNOSIS — E782 Mixed hyperlipidemia: Secondary | ICD-10-CM | POA: Diagnosis not present

## 2023-04-02 DIAGNOSIS — D509 Iron deficiency anemia, unspecified: Secondary | ICD-10-CM | POA: Diagnosis not present

## 2023-04-02 DIAGNOSIS — C7A090 Malignant carcinoid tumor of the bronchus and lung: Secondary | ICD-10-CM | POA: Diagnosis not present

## 2023-04-02 DIAGNOSIS — M810 Age-related osteoporosis without current pathological fracture: Secondary | ICD-10-CM | POA: Diagnosis not present

## 2023-04-02 DIAGNOSIS — Z23 Encounter for immunization: Secondary | ICD-10-CM | POA: Diagnosis not present

## 2023-04-02 DIAGNOSIS — E039 Hypothyroidism, unspecified: Secondary | ICD-10-CM | POA: Diagnosis not present

## 2023-04-03 ENCOUNTER — Emergency Department (HOSPITAL_COMMUNITY): Payer: Medicare PPO

## 2023-04-03 ENCOUNTER — Other Ambulatory Visit: Payer: Self-pay

## 2023-04-03 ENCOUNTER — Emergency Department (HOSPITAL_COMMUNITY)
Admission: EM | Admit: 2023-04-03 | Discharge: 2023-04-03 | Disposition: A | Discharge status: Home / Self Care | Payer: Medicare PPO | Attending: Emergency Medicine | Admitting: Emergency Medicine

## 2023-04-03 DIAGNOSIS — G43909 Migraine, unspecified, not intractable, without status migrainosus: Secondary | ICD-10-CM | POA: Diagnosis not present

## 2023-04-03 DIAGNOSIS — G4489 Other headache syndrome: Secondary | ICD-10-CM | POA: Diagnosis not present

## 2023-04-03 DIAGNOSIS — I517 Cardiomegaly: Secondary | ICD-10-CM | POA: Diagnosis not present

## 2023-04-03 DIAGNOSIS — R42 Dizziness and giddiness: Secondary | ICD-10-CM | POA: Diagnosis not present

## 2023-04-03 DIAGNOSIS — R9389 Abnormal findings on diagnostic imaging of other specified body structures: Secondary | ICD-10-CM | POA: Diagnosis not present

## 2023-04-03 DIAGNOSIS — G43809 Other migraine, not intractable, without status migrainosus: Secondary | ICD-10-CM | POA: Diagnosis not present

## 2023-04-03 DIAGNOSIS — Z85118 Personal history of other malignant neoplasm of bronchus and lung: Secondary | ICD-10-CM | POA: Insufficient documentation

## 2023-04-03 DIAGNOSIS — I10 Essential (primary) hypertension: Secondary | ICD-10-CM | POA: Insufficient documentation

## 2023-04-03 DIAGNOSIS — J432 Centrilobular emphysema: Secondary | ICD-10-CM | POA: Diagnosis not present

## 2023-04-03 DIAGNOSIS — I2510 Atherosclerotic heart disease of native coronary artery without angina pectoris: Secondary | ICD-10-CM | POA: Diagnosis not present

## 2023-04-03 DIAGNOSIS — G43009 Migraine without aura, not intractable, without status migrainosus: Secondary | ICD-10-CM

## 2023-04-03 DIAGNOSIS — R110 Nausea: Secondary | ICD-10-CM | POA: Diagnosis not present

## 2023-04-03 DIAGNOSIS — M542 Cervicalgia: Secondary | ICD-10-CM | POA: Insufficient documentation

## 2023-04-03 DIAGNOSIS — R918 Other nonspecific abnormal finding of lung field: Secondary | ICD-10-CM | POA: Diagnosis not present

## 2023-04-03 DIAGNOSIS — R519 Headache, unspecified: Secondary | ICD-10-CM | POA: Diagnosis not present

## 2023-04-03 LAB — CBC WITH DIFFERENTIAL/PLATELET
Abs Immature Granulocytes: 0.02 10*3/uL (ref 0.00–0.07)
Basophils Absolute: 0.1 10*3/uL (ref 0.0–0.1)
Basophils Relative: 1 %
Eosinophils Absolute: 0.1 10*3/uL (ref 0.0–0.5)
Eosinophils Relative: 1 %
HCT: 41 % (ref 36.0–46.0)
Hemoglobin: 13.5 g/dL (ref 12.0–15.0)
Immature Granulocytes: 0 %
Lymphocytes Relative: 6 %
Lymphs Abs: 0.5 10*3/uL — ABNORMAL LOW (ref 0.7–4.0)
MCH: 30.1 pg (ref 26.0–34.0)
MCHC: 32.9 g/dL (ref 30.0–36.0)
MCV: 91.5 fL (ref 80.0–100.0)
Monocytes Absolute: 0.7 10*3/uL (ref 0.1–1.0)
Monocytes Relative: 8 %
Neutro Abs: 7.5 10*3/uL (ref 1.7–7.7)
Neutrophils Relative %: 84 %
Platelets: 264 10*3/uL (ref 150–400)
RBC: 4.48 MIL/uL (ref 3.87–5.11)
RDW: 13.2 % (ref 11.5–15.5)
WBC: 8.9 10*3/uL (ref 4.0–10.5)
nRBC: 0 % (ref 0.0–0.2)

## 2023-04-03 LAB — URINALYSIS, ROUTINE W REFLEX MICROSCOPIC
Bilirubin Urine: NEGATIVE
Glucose, UA: NEGATIVE mg/dL
Hgb urine dipstick: NEGATIVE
Ketones, ur: NEGATIVE mg/dL
Leukocytes,Ua: NEGATIVE
Nitrite: NEGATIVE
Protein, ur: NEGATIVE mg/dL
Specific Gravity, Urine: 1.04 — ABNORMAL HIGH (ref 1.005–1.030)
pH: 6 (ref 5.0–8.0)

## 2023-04-03 LAB — COMPREHENSIVE METABOLIC PANEL
ALT: 43 U/L (ref 0–44)
AST: 52 U/L — ABNORMAL HIGH (ref 15–41)
Albumin: 3.5 g/dL (ref 3.5–5.0)
Alkaline Phosphatase: 88 U/L (ref 38–126)
Anion gap: 9 (ref 5–15)
BUN: 13 mg/dL (ref 8–23)
CO2: 25 mmol/L (ref 22–32)
Calcium: 8.8 mg/dL — ABNORMAL LOW (ref 8.9–10.3)
Chloride: 106 mmol/L (ref 98–111)
Creatinine, Ser: 0.69 mg/dL (ref 0.44–1.00)
GFR, Estimated: >60 mL/min (ref >60)
Glucose, Bld: 130 mg/dL — ABNORMAL HIGH (ref 70–99)
Potassium: 4 mmol/L (ref 3.5–5.1)
Sodium: 140 mmol/L (ref 135–145)
Total Bilirubin: 0.7 mg/dL (ref <1.2)
Total Protein: 6.4 g/dL — ABNORMAL LOW (ref 6.5–8.1)

## 2023-04-03 MED ORDER — DIPHENHYDRAMINE HCL 50 MG/ML IJ SOLN
25.0000 mg | Freq: Once | INTRAMUSCULAR | Status: DC
  Filled 2023-04-03: qty 1

## 2023-04-03 MED ORDER — METOCLOPRAMIDE HCL 5 MG/ML IJ SOLN
10.0000 mg | Freq: Once | INTRAMUSCULAR | Status: AC
  Administered 2023-04-03: 10 mg via INTRAVENOUS
  Filled 2023-04-03: qty 2

## 2023-04-03 MED ORDER — GADOBUTROL 1 MMOL/ML IV SOLN
6.0000 mL | Freq: Once | INTRAVENOUS | Status: AC | PRN
  Administered 2023-04-03: 6 mL via INTRAVENOUS

## 2023-04-03 MED ORDER — DIAZEPAM 5 MG/ML IJ SOLN
2.5000 mg | Freq: Once | INTRAMUSCULAR | Status: AC
  Administered 2023-04-03: 2.5 mg via INTRAVENOUS
  Filled 2023-04-03: qty 2

## 2023-04-03 MED ORDER — SODIUM CHLORIDE 0.9 % IV BOLUS: 1000.0000 mL | Freq: Once | INTRAVENOUS | Status: DC

## 2023-04-03 MED ORDER — DIPHENHYDRAMINE HCL 50 MG/ML IJ SOLN
25.0000 mg | Freq: Once | INTRAMUSCULAR | Status: AC
  Administered 2023-04-03: 25 mg via INTRAVENOUS
  Filled 2023-04-03: qty 1

## 2023-04-03 MED ORDER — METOCLOPRAMIDE HCL 5 MG/ML IJ SOLN
10.0000 mg | Freq: Once | INTRAMUSCULAR | Status: DC
  Filled 2023-04-03: qty 2

## 2023-04-03 MED ORDER — DOXYCYCLINE HYCLATE 100 MG PO CAPS: 100.0000 mg | ORAL_CAPSULE | Freq: Two times a day (BID) | ORAL | 0 refills | Status: DC

## 2023-04-03 MED ORDER — IOPAMIDOL (ISOVUE-370) INJECTION 76%
100.0000 mL | Freq: Once | INTRAVENOUS | Status: AC | PRN
  Administered 2023-04-03: 100 mL via INTRAVENOUS

## 2023-04-03 MED ORDER — ACETAMINOPHEN 500 MG PO TABS
1000.0000 mg | ORAL_TABLET | Freq: Once | ORAL | Status: AC
  Administered 2023-04-03: 1000 mg via ORAL
  Filled 2023-04-03: qty 2

## 2023-04-03 MED ORDER — MECLIZINE HCL 25 MG PO TABS
25.0000 mg | ORAL_TABLET | Freq: Once | ORAL | Status: DC
  Filled 2023-04-03: qty 1

## 2023-04-03 MED ORDER — SODIUM CHLORIDE 0.9 % IV BOLUS
1000.0000 mL | Freq: Once | INTRAVENOUS | Status: AC
  Administered 2023-04-03: 1000 mL via INTRAVENOUS

## 2023-04-03 MED ORDER — IOPAMIDOL (ISOVUE-370) INJECTION 76%
50.0000 mL | Freq: Once | INTRAVENOUS | Status: AC | PRN
  Administered 2023-04-03: 50 mL via INTRAVENOUS

## 2023-04-03 MED ORDER — AMOXICILLIN 500 MG PO CAPS: 1000.0000 mg | ORAL_CAPSULE | Freq: Three times a day (TID) | ORAL | 0 refills | Status: DC

## 2023-04-03 NOTE — ED Notes (Signed)
Patient transported to CT 

## 2023-04-03 NOTE — Discharge Instructions (Addendum)
Please follow-up with your primary care doctor, and your oncologist.  There was an incidental abnormal radiology finding, on your CT today.  There appears to be a nodule or a cavitary lesion, in your right lower lobe, initially was thought to possibly malignant, the second CT thinks it may be infectious, we will go ahead and treat you for possible infection, with amoxicillin and doxycycline however I would like you to follow-up with your PCP, and your oncologist for further evaluation.  Additionally I am glad your headache is feeling better, I believe that you have an atypical headache, with associated vertigo.  If symptoms worsen please return to the ER.  Drink lots of fluids, and rest today

## 2023-04-03 NOTE — ED Notes (Signed)
Patient transported to MRI 

## 2023-04-03 NOTE — ED Triage Notes (Signed)
Pt BIB EMS from home. C/o dizziness when woken & standing to walk to bathroom, followed by nausea and headache. Endorses neck pain.  Hx hypertension, recently stopped meds, per ems pt was recently seen by pcp with bp WNL.    EMS VS 164/92, HR 84, 98% room air EMS admin 4mg  zofran @ 0556 am

## 2023-04-03 NOTE — ED Provider Notes (Signed)
[place] EMERGENCY DEPARTMENT AT [facility] Provider Note   CSN: [id number] Arrival date & time: 04/03/23  0630     History  Chief Complaint  Patient presents with   Dizziness    Cynthia Rhodes is a 77 y.o. female, history of lung cancer in remission, hypertension, who presents to the ED secondary to a dizzy episode that occurred today.  She states around 2 AM she woke up to go to the bathroom, and she had a severe headache, that was from the top of her head, down her neck.  She notes that she had associated dizziness and felt like the room was spinning with this.  The room was spinning only when movement of her head, or trying to move at all.  She states that she was sitting straight forward she is not dizzy, or the room is not spinning.  She states the headache was severe, she went to have a bowel movement, and went back to bed, then continued to have headache, so woke her husband up, he called the EMS, they went to have another bowel movement again, and she decided to get in the stretcher, because the headache was so severe and she was so so severely dizzy.  She denies any chest pain, shortness of breath, weakness in this moment.  She states that her headache is splitting, 7 out of 10, and is sharp and stabbing, as well as dizziness with movement of head.  She denies any history of falls, head trauma, or head/neck surgeries.     Home Medications Prior to Admission medications   Medication Sig Start Date End Date Taking? Authorizing Provider  acetaminophen (TYLENOL) 650 MG CR tablet Take 650 mg by mouth every 8 (eight) hours as needed for pain.   Yes [provider]  amoxicillin (AMOXIL) 500 MG capsule Take 2 capsules (1,000 mg total) by mouth 3 (three) times daily. 04/03/23  Yes Vegas Fritze L, PA  Ascorbic Acid (VITAMIN C PO) Take 1 tablet by mouth daily.   Yes [provider]  denosumab (PROLIA) 60 MG/ML SOSY injection Inject 60 mg into the skin every  6 (six) months.   Yes [provider]  doxycycline (VIBRAMYCIN) 100 MG capsule Take 1 capsule (100 mg total) by mouth 2 (two) times daily. 04/03/23  Yes Aniken Monestime L, PA  EPINEPHrine 0.3 mg/0.3 mL IJ SOAJ injection Inject 0.3 mg into the muscle as needed for anaphylaxis. 04/14/19  Yes [provider]  fluticasone (FLONASE) 50 MCG/ACT nasal spray SHAKE LIQUID AND USE 2 SPRAYS IN EACH NOSTRIL DAILY 03/05/23  Yes Byrum, Les Pou, MD  ibuprofen (ADVIL) 200 MG tablet Take 200 mg by mouth every 6 (six) hours as needed for moderate pain (pain score 4-6).   Yes [provider]  levothyroxine (SYNTHROID) 50 MCG tablet Take 50 mcg by mouth every other day. Alternating with 75 mcg   Yes [provider]  levothyroxine (SYNTHROID) 75 MCG tablet Take 75 mcg by mouth every other day. Alternating with 50 mcg 09/29/19  Yes [provider]  magnesium oxide (MAG-OX) 400 MG tablet Take 400 mg by mouth daily.   Yes [provider]  Misc Natural Products (BEET ROOT PO) Take 1 tablet by mouth every other day.   Yes [provider]  Multiple Vitamins-Minerals (OCUVITE EYE HEALTH FORMULA PO) Take 1 tablet by mouth daily.   Yes [provider]  omalizumab Geoffry Paradise) 150 MG/ML prefilled syringe Inject 150 mg into the skin  See admin instructions. Every other month.   Yes [provider]  OVER THE COUNTER MEDICATION Take 1 tablet by mouth daily. Balance of Nature - Fruits and Vegetables.   Yes [provider]  amLODipine (NORVASC) 10 MG tablet Take 10 mg by mouth daily. Patient not taking: Reported on 04/03/2023 02/20/22   [provider]      Allergies    Codeine, Pravastatin, Rosuvastatin, Sulfa antibiotics, Sulfacetamide sodium-sulfur, and Voltaren [diclofenac sodium]    Review of Systems   Review of Systems  Respiratory:  Negative for shortness of breath.   Neurological:  Positive for dizziness.    Physical  Exam Updated Vital Signs BP 125/71   Pulse (!) 56   Temp 97.7 F (36.5 C) (Oral)   Resp 16   Ht 5\' 6"  (1.676 m)   Wt 68.9 kg   SpO2 98%   BMI 24.53 kg/m  Physical Exam Vitals and nursing note reviewed.  Constitutional:      General: She is not in acute distress.    Appearance: She is well-developed.  HENT:     Head: Normocephalic and atraumatic.     Right Ear: Tympanic membrane normal.     Left Ear: Tympanic membrane normal.  Eyes:     Conjunctiva/sclera: Conjunctivae normal.  Cardiovascular:     Rate and Rhythm: Normal rate and regular rhythm.     Heart sounds: No murmur heard. Pulmonary:     Effort: Pulmonary effort is normal. No respiratory distress.     Breath sounds: Normal breath sounds.  Abdominal:     Palpations: Abdomen is soft.     Tenderness: There is no abdominal tenderness.  Musculoskeletal:        General: No swelling.     Cervical back: Neck supple.  Skin:    General: Skin is warm and dry.     Capillary Refill: Capillary refill takes less than 2 seconds.  Neurological:     General: No focal deficit present.     Mental Status: She is alert and oriented to person, place, and time.     Cranial Nerves: No cranial nerve deficit.     Sensory: No sensory deficit.     Motor: No weakness.     Coordination: Coordination normal.  Psychiatric:        Mood and Affect: Mood normal.     ED Results / Procedures / Treatments   Labs (all labs ordered are listed, but only abnormal results are displayed) Labs Reviewed  CBC WITH DIFFERENTIAL/PLATELET - Abnormal; Notable for the following components:      Result Value   Lymphs Abs 0.5 (*)    All other components within normal limits  COMPREHENSIVE METABOLIC PANEL - Abnormal; Notable for the following components:   Glucose, Bld 130 (*)    Calcium 8.8 (*)    Total Protein 6.4 (*)    AST 52 (*)    All other components within normal limits  URINALYSIS, ROUTINE W REFLEX MICROSCOPIC - Abnormal; Notable for the  following components:   Specific Gravity, Urine 1.040 (*)    All other components within normal limits    EKG EKG Interpretation Date/Time:  Tuesday April 03 2023 06:44:48 EST Ventricular Rate:  65 PR Interval:  180 QRS Duration:  105 QT Interval:  427 QTC Calculation: 444 R Axis:   64  Text Interpretation: Sinus rhythm Confirmed by Virgina Norfolk [phone]) on 04/03/2023 7:27:20 AM  Radiology CT Angio Chest PE W and/or Wo Contrast  Result Date: 04/03/2023 CLINICAL DATA:  History of cancer. Right upper lobe mass seen on earlier neck CT. EXAM: CT ANGIOGRAPHY CHEST WITH CONTRAST TECHNIQUE: Multidetector CT imaging of the chest was performed using the standard protocol during bolus administration of intravenous contrast. Multiplanar CT image reconstructions and MIPs were obtained to evaluate the vascular anatomy. RADIATION DOSE REDUCTION: This exam was performed according to the departmental dose-optimization program which includes automated exposure control, adjustment of the mA and/or kV according to patient size and/or use of iterative reconstruction technique. CONTRAST:  50mL ISOVUE-370 IOPAMIDOL (ISOVUE-370) INJECTION 76% COMPARISON:  Neck CT earlier today.  PET CT 04/24/2022 FINDINGS: Cardiovascular: No filling defects in the pulmonary arteries to suggest pulmonary emboli. Heart mildly enlarged. Scattered coronary artery and aortic calcifications. No evidence of aortic aneurysm. Mediastinum/Nodes: No mediastinal, hilar, or axillary adenopathy. Trachea and esophagus are unremarkable. Thyroid unremarkable. Lungs/Pleura: Nodular area noted in the right upper lobe on image 55 of series 6 measuring 2.1 x 0.8 cm. Avyonna Wagoner area of cavitation suspected. Branching tubular area noted in the right lower lobe on image 52 felt to most likely reflect mucous plugging of a dilated peripheral airway. Rounded airspace opacity posteriorly in the left lower lobe measuring up to 2.7 cm. No effusions. Postoperative  changes on the right. Mild centrilobular emphysema. Upper Abdomen: No acute findings Musculoskeletal: Chest wall soft tissues are unremarkable. No acute bony abnormality. Review of the MIP images confirms the above findings. IMPRESSION: Elongated ovoid nodular area in the right upper lobe with Khizar Fiorella area of cavitation. Adjacent branching tubular area within the right lower lobe and somewhat rounded opacity at the left lower lobe. I favor these are most likely infectious/inflammatory, but recommend short-term follow-up CT after empiric antibiotic treatment to ensure resolution. No evidence of pulmonary embolus. Mild cardiomegaly, coronary artery disease. Aortic Atherosclerosis (ICD10-I70.0) and Emphysema (ICD10-J43.9). Electronically Signed   By: Charlett Nose M.D.   On: 04/03/2023 14:21   MR Brain W and Wo Contrast  Result Date: 04/03/2023 CLINICAL DATA:  Headache, new onset. Severe headache beginning last night. Dizziness. Possible lung mass. EXAM: MRI HEAD WITHOUT AND WITH CONTRAST TECHNIQUE: Multiplanar, multiecho pulse sequences of the brain and surrounding structures were obtained without and with intravenous contrast. CONTRAST:  6mL GADAVIST GADOBUTROL 1 MMOL/ML IV SOLN COMPARISON:  CT studies earlier today FINDINGS: Brain: Diffusion imaging does not show any acute or subacute infarction or other cause of restricted diffusion. The brainstem and cerebellum are normal. Cerebral hemispheres show minimal Malcolm Quast vessel changes of the white matter, less than often seen at this age. No evidence of mass, hemorrhage, hydrocephalus or extra-axial collection. After contrast administration, no abnormal enhancement occurs. Vascular: Major vessels at the base of the brain show flow. Skull and upper cervical spine: Negative Sinuses/Orbits: Clear/normal Other: None IMPRESSION: No acute or reversible finding. No evidence of metastatic disease. Minimal Lynnleigh Soden vessel changes of the cerebral hemispheric white matter, less than  often seen at this age. Electronically Signed   By: Paulina Fusi M.D.   On: 04/03/2023 13:31   CT ANGIO HEAD NECK W WO CM  Result Date: 04/03/2023 CLINICAL DATA:  Severe headache and neck pain. EXAM: CT ANGIOGRAPHY HEAD AND NECK WITH AND WITHOUT CONTRAST TECHNIQUE: Multidetector CT imaging of the head and neck was performed using the standard protocol during bolus administration of intravenous contrast. Multiplanar CT image reconstructions and MIPs were obtained to evaluate the vascular anatomy. Carotid stenosis measurements (when applicable) are obtained utilizing NASCET criteria, using the distal internal carotid diameter  as the denominator. RADIATION DOSE REDUCTION: This exam was performed according to the departmental dose-optimization program which includes automated exposure control, adjustment of the mA and/or kV according to patient size and/or use of iterative reconstruction technique. CONTRAST:  ISOVUE-370 IOPAMIDOL (ISOVUE-370) INJECTION 76% COMPARISON:  Chest CT 08/06/2021. FINDINGS: CT HEAD FINDINGS Brain: No acute intracranial hemorrhage. Gray-white differentiation is preserved. No hydrocephalus or extra-axial collection. No mass effect or midline shift. Vascular: No hyperdense vessel or unexpected calcification. Skull: No calvarial fracture or suspicious bone lesion. Skull base is unremarkable. Sinuses/Orbits: No acute finding. Other: None. Review of the MIP images confirms the above findings CTA NECK FINDINGS Aortic arch: Three-vessel arch configuration. Arch vessel origins are patent. Right carotid system: No evidence of dissection, stenosis (50% or greater), or occlusion. Left carotid system: No evidence of dissection, stenosis (50% or greater), or occlusion. Vertebral arteries: Codominant. No evidence of dissection, stenosis (50% or greater), or occlusion. Skeleton: Mild cervical spondylosis without high-grade spinal canal stenosis. Other neck: Unremarkable. Upper chest: New irregular  nodular opacity in the right upper lobe with peripheral cavitation, measuring 12 mm (axial images 165-167 series 7). Review of the MIP images confirms the above findings CTA HEAD FINDINGS Anterior circulation: Intracranial ICAs are patent without stenosis or aneurysm. The proximal ACAs and MCAs are patent without stenosis or aneurysm. Distal branches are symmetric. Posterior circulation: Normal basilar artery. The SCAs, AICAs and PICAs are patent proximally. The PCAs are patent proximally without stenosis or aneurysm. Distal branches are symmetric. Venous sinuses: As permitted by contrast timing, patent. Anatomic variants: None. Review of the MIP images confirms the above findings IMPRESSION: 1. No acute intracranial abnormality. 2. No large vessel occlusion, hemodynamically significant stenosis, or aneurysm in the head or neck. 3. New irregular solid nodular opacity in the right upper lobe with peripheral cavitation, measuring 12 mm. This is favored to be infectious, inflammatory or neoplastic. Dedicated chest CT is recommended at this time. This recommendation follows the consensus statement: Guidelines for Management of Incidental Pulmonary Nodules Detected on CT Images: From the Fleischner Society 2017; Radiology 2017; 284:228-243. Electronically Signed   By: Orvan Falconer M.D.   On: 04/03/2023 10:34    Procedures Procedures    Medications Ordered in ED Medications  acetaminophen (TYLENOL) tablet 1,000 mg (1,000 mg Oral Given 04/03/23 0732)  diazepam (VALIUM) injection 2.5 mg (2.5 mg Intravenous Given 04/03/23 0739)  iopamidol (ISOVUE-370) 76 % injection 100 mL (100 mLs Intravenous Contrast Given 04/03/23 0920)  metoCLOPramide (REGLAN) injection 10 mg (10 mg Intravenous Given 04/03/23 1102)  diphenhydrAMINE (BENADRYL) injection 25 mg (25 mg Intravenous Given 04/03/23 1101)  sodium chloride 0.9 % bolus 1,000 mL (0 mLs Intravenous Stopped 04/03/23 1200)  iopamidol (ISOVUE-370) 76 % injection 50 mL  (50 mLs Intravenous Contrast Given 04/03/23 1151)  gadobutrol (GADAVIST) 1 MMOL/ML injection 6 mL (6 mLs Intravenous Contrast Given 04/03/23 1316)    ED Course/ Medical Decision Making/ A&P                                 Medical Decision Making Patient is a 77 year old female, here with headache, neck pain, that began around 2 AM today.  She has associated dizziness or vertigo.  Worse with movement.  Not present at rest.  I discussed with Dr. Lockie Mola, and he would like to give the patient some Valium and reevaluate for improvement, and get head CT.  Head CT ordered as well as  blood work.  EKG shows normal sinus rhythm.  No focal deficits  Amount and/or Complexity of Data Reviewed Labs: ordered.    Details: Unremarkable Radiology: ordered.    Details: CTA head/neck, shows no acute intra cranial abnormality, but there is a solid nodular, opacity in the right upper lobe.  MRI unremarkable, CTA PE study shows elongated ovoid nodular area in the right upper lobe, there is adjacent branching in the right lower lobe and somewhat opacity in the left lower lobe presumed to be infectious ECG/medicine tests:  Decision-making details documented in ED Course. Discussion of management or test interpretation with external provider(s): Discussed with patient, blood work is reassuring, CTA PE shows findings concerning for likely infectious process, favored over malignant process.  Recommend empiric antibiotics.  She does have a cough on exam, however lungs are fairly clear.  She states she has had the cough since she had lung cancer.  We will start her on empiric antibiotics, doxycycline and amoxicillin, to treat for any possible pneumonia or infectious disease, and then have her follow-up with her PCP and oncologist for further evaluation.  We discussed that this may be malignant, I will treat for possible infectious and have follow-up imaging.  Her dizziness,/vertigo resolved after headache cocktail and headache  resolved.  CTA head/neck and MRI were unremarkable.  She was discharged home with strict return precautions.  Legrand Rams that patient likely had a atypical migraine.  Risk OTC drugs. Prescription drug management.    Final Clinical Impression(s) / ED Diagnoses Final diagnoses:  Atypical migraine  Vertigo  Abnormal finding on CT scan    Rx / DC Orders ED Discharge Orders          Ordered    doxycycline (VIBRAMYCIN) 100 MG capsule  2 times daily        04/03/23 1429    amoxicillin (AMOXIL) 500 MG capsule  3 times daily        04/03/23 1429              Shiri Hodapp L, PA 04/03/23 1450    Virgina Norfolk, DO 04/03/23 1455

## 2023-04-08 NOTE — Progress Notes (Signed)
Cardiology Office Note:  .   Date:  04/09/2023  ID:  CECILLA BUSSEY, DOB [birth year], MRN [id number] PCP: Koren Shiver, DO  [facility] Providers Cardiologist:  Jodelle Red, MD {  History of Present Illness: Cynthia Rhodes   Cynthia Rhodes is a 77 y.o. 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 She was previously followed by Dr. Shari Prows and established care with me on 04/09/23.  Pertinent CV history: Echo 07/2022 with normal EF, no significant abnormalities. Coronary CT 03/2022 with minimal nonobstructive CAD and Ca score 14.7 (30th %tile). Monitor with brief SVT but symptomatic events were sinus.  Today: Would like to discuss her cardiac diagnoses, reviewed today. Asking about cooking classes, given link today.   Went to ER 04/03/23, was told she had an atypical migraine, she is concerned that it was a TIA. Balance, dizziness was the main symptom for her; she couldn't walk due to dizziness. Has never had migraines before. Occurred late at night. CT and MRI unremarkable.   DOE related primary to her cancer/prior surgery of her lung.   Doesn't feel any palpitations, never had.  ROS: Denies chest pain, shortness of breath at rest or with normal exertion. No PND, orthopnea, LE edema or unexpected weight gain. No syncope. ROS otherwise negative except as noted.   Studies Reviewed: Cynthia Rhodes    EKG:  EKG Interpretation Date/Time:  Monday April 09 2023 09:08:53 EST Ventricular Rate:  61 PR Interval:  174 QRS Duration:  92 QT Interval:  418 QTC Calculation: 420 R Axis:   66  Text Interpretation: Normal sinus rhythm Normal ECG Confirmed by Jodelle Red [phone]) on 04/09/2023 9:39:24 AM    Physical Exam:   VS:  BP 132/84 (BP Location: Left Arm, Patient Position: Sitting, Cuff Size: Normal)   Pulse 61   Ht 5\' 5"  (1.651 m)   Wt 154 lb (69.9 kg)   SpO2 95%   BMI 25.63 kg/m    Wt Readings from Last 3  Encounters:  04/09/23 154 lb (69.9 kg)  04/03/23 152 lb (68.9 kg)  10/12/22 151 lb 6.4 oz (68.7 kg)    GEN: Well nourished, well developed in no acute distress HEENT: Normal, moist mucous membranes NECK: No JVD CARDIAC: regular rhythm, normal S1 and S2, no rubs or gallops. 2/6 systolic murmur. VASCULAR: Radial and DP pulses 2+ bilaterally. No carotid bruits RESPIRATORY:  Clear to auscultation without rales, wheezing or rhonchi  ABDOMEN: Soft, non-tender, non-distended MUSCULOSKELETAL:  Ambulates independently SKIN: Warm and dry, no edema NEUROLOGIC:  Alert and oriented x 3. No focal neuro deficits noted. PSYCHIATRIC:  Normal affect    ASSESSMENT AND PLAN: .    DOE -due to lung resection/management of her carcinoid cancer  Nonobstructive CAD Hyperlipidemia Statin myopathy -reviewed today -did not tolerate statins -tolerating ezetimibe -discussed aspirin today, with concern for progression of cancer, will hold on this for now -discussed red flag signs that need immediate medical attention  Hypertension -borderline today, doesn't check at home -no longer on amlodipine. BP reasonable off meds  Asymptomatic sinus bradycardia Asymptomatic brief pSVT -asymptomatic  Mild aortic valve stenosis -monitor clinically  Stage 2a lung carcinoid s/p resection 2021 -followed closely by oncology  CV risk counseling and prevention -recommend heart healthy/Mediterranean diet, with whole grains, fruits, vegetable, fish, lean meats, nuts, and olive oil. Limit salt. -recommend moderate walking, 3-5 times/week for 30-50 minutes each session. Aim for at least 150 minutes.week. Goal should be pace of 3  miles/hours, or walking 1.5 miles in 30 minutes -recommend avoidance of tobacco products. Avoid excess alcohol.  Dispo: 1 year or sooner as needed  Signed, Jodelle Red, MD   Jodelle Red, MD, PhD, [facility]  [facility]  [place]  Heart & Vascular at  [facility] at [facility] [address] [place], [place] [address] [phone]

## 2023-04-09 ENCOUNTER — Ambulatory Visit (INDEPENDENT_AMBULATORY_CARE_PROVIDER_SITE_OTHER): Payer: Medicare PPO | Admitting: Cardiology

## 2023-04-09 ENCOUNTER — Encounter (HOSPITAL_BASED_OUTPATIENT_CLINIC_OR_DEPARTMENT_OTHER): Payer: Self-pay | Admitting: Cardiology

## 2023-04-09 VITALS — BP 132/84 | HR 61 | Ht 65.0 in | Wt 154.0 lb

## 2023-04-09 DIAGNOSIS — T466X5D Adverse effect of antihyperlipidemic and antiarteriosclerotic drugs, subsequent encounter: Secondary | ICD-10-CM | POA: Diagnosis not present

## 2023-04-09 DIAGNOSIS — D3A090 Benign carcinoid tumor of the bronchus and lung: Secondary | ICD-10-CM

## 2023-04-09 DIAGNOSIS — I471 Supraventricular tachycardia, unspecified: Secondary | ICD-10-CM | POA: Diagnosis not present

## 2023-04-09 DIAGNOSIS — G720 Drug-induced myopathy: Secondary | ICD-10-CM | POA: Insufficient documentation

## 2023-04-09 DIAGNOSIS — I2510 Atherosclerotic heart disease of native coronary artery without angina pectoris: Secondary | ICD-10-CM | POA: Diagnosis not present

## 2023-04-09 DIAGNOSIS — R011 Cardiac murmur, unspecified: Secondary | ICD-10-CM | POA: Diagnosis not present

## 2023-04-09 DIAGNOSIS — E7800 Pure hypercholesterolemia, unspecified: Secondary | ICD-10-CM

## 2023-04-09 DIAGNOSIS — R0609 Other forms of dyspnea: Secondary | ICD-10-CM | POA: Diagnosis not present

## 2023-04-09 DIAGNOSIS — I10 Essential (primary) hypertension: Secondary | ICD-10-CM

## 2023-04-09 NOTE — Patient Instructions (Addendum)
Medication Instructions:  Your physician recommends that you continue on your current medications as directed. Please refer to the Current Medication list given to you today.  *If you need a refill on your cardiac medications before your next appointment, please call your pharmacy*  Lab Work: NONE  Testing/Procedures: NONE  Follow-Up: At [facility], you and your health needs are our priority.  As part of our continuing mission to provide you with exceptional heart care, we have created designated Provider Care Teams.  These Care Teams include your primary Cardiologist (physician) and Advanced Practice Providers (APPs -  Physician Assistants and Nurse Practitioners) who all work together to provide you with the care you need, when you need it.  We recommend signing up for the patient portal called "MyChart".  Sign up information is provided on this After Visit Summary.  MyChart is used to connect with patients for Virtual Visits (Telemedicine).  Patients are able to view lab/test results, encounter notes, upcoming appointments, etc.  Non-urgent messages can be sent to your provider as well.   To learn more about what you can do with MyChart, go to [email or web address].    Your next appointment:   12 month(s)  The format for your next appointment:   In Person  Provider:   Jodelle Red, MD      Here is the link to the [facility], including links to sign up for classes:  [email or web address]

## 2023-04-13 ENCOUNTER — Telehealth: Payer: Self-pay

## 2023-04-13 ENCOUNTER — Ambulatory Visit (HOSPITAL_BASED_OUTPATIENT_CLINIC_OR_DEPARTMENT_OTHER): Payer: Medicare PPO | Admitting: Cardiology

## 2023-04-13 NOTE — Telephone Encounter (Signed)
Spoke with pt in regards to CT scan appt.  Informed patient of centralized scheduling phone number [phone]).  Informed pt once CT scan is scheduled to give our office a call back and will move lab appt an hour prior to scan.  Pt verbalized understanding.

## 2023-04-16 DIAGNOSIS — G44209 Tension-type headache, unspecified, not intractable: Secondary | ICD-10-CM | POA: Diagnosis not present

## 2023-04-16 DIAGNOSIS — J159 Unspecified bacterial pneumonia: Secondary | ICD-10-CM | POA: Diagnosis not present

## 2023-04-16 DIAGNOSIS — Z859 Personal history of malignant neoplasm, unspecified: Secondary | ICD-10-CM | POA: Diagnosis not present

## 2023-04-16 DIAGNOSIS — Z09 Encounter for follow-up examination after completed treatment for conditions other than malignant neoplasm: Secondary | ICD-10-CM | POA: Diagnosis not present

## 2023-04-16 DIAGNOSIS — I10 Essential (primary) hypertension: Secondary | ICD-10-CM | POA: Diagnosis not present

## 2023-04-23 DIAGNOSIS — L6611 Classic lichen planopilaris: Secondary | ICD-10-CM | POA: Diagnosis not present

## 2023-04-30 ENCOUNTER — Inpatient Hospital Stay: Payer: Medicare PPO | Attending: Nurse Practitioner

## 2023-04-30 ENCOUNTER — Ambulatory Visit (HOSPITAL_COMMUNITY)
Admission: RE | Admit: 2023-04-30 | Discharge: 2023-04-30 | Disposition: A | Discharge status: Home / Self Care | Payer: Medicare PPO | Source: Ambulatory Visit | Attending: Internal Medicine | Admitting: Internal Medicine

## 2023-04-30 DIAGNOSIS — R053 Chronic cough: Secondary | ICD-10-CM | POA: Diagnosis not present

## 2023-04-30 DIAGNOSIS — Z902 Acquired absence of lung [part of]: Secondary | ICD-10-CM | POA: Diagnosis not present

## 2023-04-30 DIAGNOSIS — R911 Solitary pulmonary nodule: Secondary | ICD-10-CM | POA: Diagnosis not present

## 2023-04-30 DIAGNOSIS — C3490 Malignant neoplasm of unspecified part of unspecified bronchus or lung: Secondary | ICD-10-CM | POA: Diagnosis not present

## 2023-04-30 DIAGNOSIS — Z85110 Personal history of malignant carcinoid tumor of bronchus and lung: Secondary | ICD-10-CM | POA: Diagnosis not present

## 2023-04-30 LAB — CMP (CANCER CENTER ONLY)
ALT: 25 U/L (ref 0–44)
AST: 31 U/L (ref 15–41)
Albumin: 4 g/dL (ref 3.5–5.0)
Alkaline Phosphatase: 81 U/L (ref 38–126)
Anion gap: 5 (ref 5–15)
BUN: 17 mg/dL (ref 8–23)
CO2: 27 mmol/L (ref 22–32)
Calcium: 9 mg/dL (ref 8.9–10.3)
Chloride: 106 mmol/L (ref 98–111)
Creatinine: 1.09 mg/dL — ABNORMAL HIGH (ref 0.44–1.00)
GFR, Estimated: 52 mL/min — ABNORMAL LOW (ref >60)
Glucose, Bld: 99 mg/dL (ref 70–99)
Potassium: 4.1 mmol/L (ref 3.5–5.1)
Sodium: 138 mmol/L (ref 135–145)
Total Bilirubin: 0.6 mg/dL (ref <1.2)
Total Protein: 7 g/dL (ref 6.5–8.1)

## 2023-04-30 LAB — CBC WITH DIFFERENTIAL (CANCER CENTER ONLY)
Abs Immature Granulocytes: 0.01 10*3/uL (ref 0.00–0.07)
Basophils Absolute: 0.1 10*3/uL (ref 0.0–0.1)
Basophils Relative: 1 %
Eosinophils Absolute: 0.3 10*3/uL (ref 0.0–0.5)
Eosinophils Relative: 5 %
HCT: 43.3 % (ref 36.0–46.0)
Hemoglobin: 14.1 g/dL (ref 12.0–15.0)
Immature Granulocytes: 0 %
Lymphocytes Relative: 23 %
Lymphs Abs: 1.4 10*3/uL (ref 0.7–4.0)
MCH: 30 pg (ref 26.0–34.0)
MCHC: 32.6 g/dL (ref 30.0–36.0)
MCV: 92.1 fL (ref 80.0–100.0)
Monocytes Absolute: 0.8 10*3/uL (ref 0.1–1.0)
Monocytes Relative: 12 %
Neutro Abs: 3.6 10*3/uL (ref 1.7–7.7)
Neutrophils Relative %: 59 %
Platelet Count: 280 10*3/uL (ref 150–400)
RBC: 4.7 MIL/uL (ref 3.87–5.11)
RDW: 13.3 % (ref 11.5–15.5)
WBC Count: 6.1 10*3/uL (ref 4.0–10.5)
nRBC: 0 % (ref 0.0–0.2)

## 2023-04-30 MED ORDER — IOHEXOL 300 MG/ML  SOLN
75.0000 mL | Freq: Once | INTRAMUSCULAR | Status: AC | PRN
  Administered 2023-04-30: 75 mL via INTRAVENOUS

## 2023-05-03 ENCOUNTER — Inpatient Hospital Stay (HOSPITAL_BASED_OUTPATIENT_CLINIC_OR_DEPARTMENT_OTHER): Payer: Medicare PPO | Admitting: Nurse Practitioner

## 2023-05-03 ENCOUNTER — Encounter: Payer: Self-pay | Admitting: Nurse Practitioner

## 2023-05-03 VITALS — BP 174/77 | HR 66 | Temp 97.6°F | Resp 17 | Wt 154.3 lb

## 2023-05-03 DIAGNOSIS — C3490 Malignant neoplasm of unspecified part of unspecified bronchus or lung: Secondary | ICD-10-CM | POA: Diagnosis not present

## 2023-05-03 DIAGNOSIS — Z902 Acquired absence of lung [part of]: Secondary | ICD-10-CM | POA: Diagnosis not present

## 2023-05-03 DIAGNOSIS — R053 Chronic cough: Secondary | ICD-10-CM | POA: Diagnosis not present

## 2023-05-03 DIAGNOSIS — R911 Solitary pulmonary nodule: Secondary | ICD-10-CM | POA: Diagnosis not present

## 2023-05-03 DIAGNOSIS — Z85110 Personal history of malignant carcinoid tumor of bronchus and lung: Secondary | ICD-10-CM | POA: Diagnosis not present

## 2023-05-03 NOTE — Progress Notes (Addendum)
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   PRIOR THERAPY: Status post right middle lobectomy with right upper lobe wedge resection and lymph node dissection under the care of Dr. Dorris Fetch on 01/15/2020.   CURRENT THERAPY: Observation.   INTERVAL HISTORY Ms. Hansard returns for follow up as scheduled. Last seen by Dr. Arbutus Ped 05/03/22. She developed headache/neck pain and dizziness a month ago and came to ED. Work up most c/w infection and treated for pneumonia. She completed amoxicillin. Since then she has a little more fatigue and exertional dyspnea that seems associated with weather change like humidity. Uses inhaler PRN. Denies significant cough or chest pain. She had a CT chest on 12/9.    ROS  All other systems reviewed and negative   Past Medical History:  Diagnosis Date   Cancer (HCC) 2021   History of hiatal hernia    Hypothyroidism    Peripheral vascular disease (HCC)    varicose veins     Past Surgical History:  Procedure Laterality Date   ABDOMINAL HYSTERECTOMY     APPENDECTOMY  1954   BRONCHIAL BIOPSY  11/04/2019   Procedure: BRONCHIAL BIOPSIES;  Surgeon: Leslye Peer, MD;  Location: [facility] ENDOSCOPY;  Service: Pulmonary;;   BRONCHIAL BRUSHINGS  11/04/2019   Procedure: BRONCHIAL BRUSHINGS;  Surgeon: Leslye Peer, MD;  Location: [facility] ENDOSCOPY;  Service: Pulmonary;;   BRONCHIAL NEEDLE ASPIRATION BIOPSY  11/04/2019   Procedure: BRONCHIAL NEEDLE ASPIRATION BIOPSIES;  Surgeon: Leslye Peer, MD;  Location: [facility] ENDOSCOPY;  Service: Pulmonary;;   BRONCHIAL WASHINGS  11/04/2019   Procedure: BRONCHIAL WASHINGS;  Surgeon: Leslye Peer, MD;  Location: [facility]  ENDOSCOPY;  Service: Pulmonary;;   BUNIONECTOMY Bilateral    CHOLECYSTECTOMY     COLON SURGERY  1954   EYE SURGERY Bilateral 2021   INTERCOSTAL NERVE BLOCK Right 01/15/2020   Procedure: INTERCOSTAL NERVE BLOCK;  Surgeon: Loreli Slot, MD;  Location: [facility] OR;  Service: Thoracic;  Laterality: Right;   JOINT REPLACEMENT Bilateral    Knee   KNEE SURGERY Bilateral    LYMPH NODE DISSECTION N/A 01/15/2020   Procedure: LYMPH NODE DISSECTION;  Surgeon: Loreli Slot, MD;  Location: [facility] OR;  Service: Thoracic;  Laterality: N/A;   REPLACEMENT TOTAL KNEE BILATERAL     SIGMOIDECTOMY     TONSILLECTOMY     VIDEO BRONCHOSCOPY WITH ENDOBRONCHIAL NAVIGATION N/A 11/04/2019   Procedure: VIDEO BRONCHOSCOPY WITH ENDOBRONCHIAL NAVIGATION;  Surgeon: Leslye Peer, MD;  Location: [facility] ENDOSCOPY;  Service: Pulmonary;  Laterality: N/A;   VIDEO BRONCHOSCOPY WITH ENDOBRONCHIAL NAVIGATION N/A 01/15/2020   Procedure: VIDEO BRONCHOSCOPY WITH ENDOBRONCHIAL NAVIGATION to mark right upper lobe nodule;  Surgeon: Loreli Slot, MD;  Location: [facility] OR;  Service: Thoracic;  Laterality: N/A;     Outpatient Encounter Medications as of 05/03/2023  Medication Sig   acetaminophen (TYLENOL) 650 MG CR tablet Take 650 mg by mouth every 8 (eight) hours as needed for pain.   Ascorbic Acid (VITAMIN C PO) Take 1 tablet by mouth daily.   Cholecalciferol (VITAMIN D3) 250 MCG (10000 UT) capsule Take 10,000 Units by mouth daily.   denosumab (PROLIA) 60 MG/ML SOSY injection Inject 60 mg into the  skin every 6 (six) months. Pt says she takes every other month   doxycycline (VIBRAMYCIN) 100 MG capsule Take 1 capsule (100 mg total) by mouth 2 (two) times daily.   EPINEPHrine 0.3 mg/0.3 mL IJ SOAJ injection Inject 0.3 mg into the muscle as needed for anaphylaxis.   ezetimibe (ZETIA) 10 MG tablet Take 10 mg by mouth daily.   fluticasone (FLONASE) 50 MCG/ACT nasal spray SHAKE LIQUID AND USE 2 SPRAYS IN EACH NOSTRIL DAILY    ibuprofen (ADVIL) 200 MG tablet Take 200 mg by mouth every 6 (six) hours as needed for moderate pain (pain score 4-6).   levothyroxine (SYNTHROID) 50 MCG tablet Take 50 mcg by mouth every other day. Alternating with 75 mcg   levothyroxine (SYNTHROID) 75 MCG tablet Take 75 mcg by mouth every other day. Alternating with 50 mcg   magnesium oxide (MAG-OX) 400 MG tablet Take 400 mg by mouth daily.   Misc Natural Products (BEET ROOT PO) Take 1 tablet by mouth every other day.   Multiple Vitamins-Minerals (OCUVITE EYE HEALTH FORMULA PO) Take 1 tablet by mouth daily.   omalizumab Geoffry Paradise) 150 MG/ML prefilled syringe Inject 150 mg into the skin See admin instructions. Every other month.   OVER THE COUNTER MEDICATION Take 1 tablet by mouth daily. Balance of Nature - Fruits and Vegetables.   [DISCONTINUED] amoxicillin (AMOXIL) 500 MG capsule Take 2 capsules (1,000 mg total) by mouth 3 (three) times daily.   No facility-administered encounter medications on file as of 05/03/2023.     Today's Vitals   05/03/23 0856 05/03/23 0859  BP: (!) 174/77   Pulse: 66   Resp: 17   Temp: 97.6 F (36.4 C)   TempSrc: Temporal   SpO2: 97%   Weight: 154 lb 4.8 oz (70 kg)   PainSc:  0-No pain   Body mass index is 25.68 kg/m.   PHYSICAL EXAM GENERAL:alert, no distress and comfortable SKIN: no rash  EYES: sclera clear NECK: without mass LYMPH:  no palpable cervical or supraclavicular lymphadenopathy  LUNGS: clear with normal breathing effort HEART: regular rate & rhythm, no lower extremity edema ABDOMEN: abdomen soft, non-tender and normal bowel sounds NEURO: alert & oriented x 3 with fluent speech, no focal motor/sensory deficits   CBC    Component Value Date/Time   WBC 6.1 04/30/2023 0904   WBC 8.9 04/03/2023 0739   RBC 4.70 04/30/2023 0904   HGB 14.1 04/30/2023 0904   HGB 13.6 10/12/2022 0853   HCT 43.3 04/30/2023 0904   HCT 43.1 10/12/2022 0853   PLT 280 04/30/2023 0904   PLT 311 10/12/2022  0853   MCV 92.1 04/30/2023 0904   MCV 84 10/12/2022 0853   MCH 30.0 04/30/2023 0904   MCHC 32.6 04/30/2023 0904   RDW 13.3 04/30/2023 0904   RDW 18.6 (H) 10/12/2022 0853   LYMPHSABS 1.4 04/30/2023 0904   LYMPHSABS 1.7 10/12/2022 0853   MONOABS 0.8 04/30/2023 0904   EOSABS 0.3 04/30/2023 0904   EOSABS 0.3 10/12/2022 0853   BASOSABS 0.1 04/30/2023 0904   BASOSABS 0.1 10/12/2022 0853     CMP     Component Value Date/Time   NA 138 04/30/2023 0904   NA 140 03/22/2022 0854   K 4.1 04/30/2023 0904   CL 106 04/30/2023 0904   CO2 27 04/30/2023 0904   GLUCOSE 99 04/30/2023 0904   BUN 17 04/30/2023 0904   BUN 17 03/22/2022 0854   CREATININE 1.09 (H) 04/30/2023 0904   CALCIUM 9.0 04/30/2023  0904   PROT 7.0 04/30/2023 0904   ALBUMIN 4.0 04/30/2023 0904   AST 31 04/30/2023 0904   ALT 25 04/30/2023 0904   ALKPHOS 81 04/30/2023 0904   BILITOT 0.6 04/30/2023 0904   GFRNONAA 52 (L) 04/30/2023 0904   GFRAA >60 02/10/2020 1144     ASSESSMENT & PLAN: 77 yo female   Stage IIIA (T4, N0, M0) well-differentiated neuroendocrine tumor/carcinoid tumor -Presented with multifocal disease in the right upper lobe as well as the right middle lobe -S/p right middle lobectomy and wedge resection of the right upper lobe with lymph node dissection under the care of Dr. Dorris Fetch on 01/15/2020. -PET 04/2022 showed no hypermetabolic recurrence in the lung but an incidental focal hypermetabolism in the sigmoid colon.  Per patient she underwent colonoscopy which was benign and is on a 2-year recall -Currently on observation, doing well until she developed dizziness and headache in 03/2023, ED workup showed a right upper lung nodule and opacities most consistent with a pulmonary infection/pneumonia and was treated with a course of antibiotics and has improved, still mild fatigue and exertional dyspnea especially when it is humid -Ms. Cryder appears well, she continues to recover.  I reviewed the CT chest,  the previously identified right upper lung nodule when she was hospitalized for infection has decreased in size with increased cavitation, this is likely benign/infectious.  Will follow-up with a CT in 6 months to ensure resolution -No other signs of recurrent disease. Continue surveillance -Pt seen with Dr. Shirline Frees    PLAN: -Recent ED work up, imaging, and lab/CT chest 12/9 reviewed and compared  -CT c/w resolving infection, repeat in 6 months to ensure resolution  -No other evidence of recurrent disease -Continue surveillance -F/up in 6 months (a week after lab/CT) -Pt seen with Dr. Shirline Frees   Orders Placed This Encounter  Procedures   CT Chest W Contrast    Standing Status:   Future    Expected Date:   11/01/2023    Expiration Date:   05/02/2024    If indicated for the ordered procedure, I authorize the administration of contrast media per Radiology protocol:   Yes    Does the patient have a contrast media/X-ray dye allergy?:   No    Preferred imaging location?:   [facility]      All questions were answered. The patient knows to call the clinic with any problems, questions or concerns. No barriers to learning were detected.   Santiago Glad, NP-C 05/03/2023  ADDENDUM: Hematology/Oncology Attending: I had a face-to-face encounter with the patient.  I reviewed her record, lab, scan and recommended her care plan.  This is a very pleasant 77 years old white female with a stage IIIA typical carcinoid tumor presented with multifocal disease involving the right middle lobe and right upper lobe diagnosed in June 2021 status post right middle lobectomy with wedge resection of the right upper lobe and lymph node dissection under the care of Dr. Dorris Fetch.  The patient has been in observation since that time and she is feeling fine. She had repeat CT scan of the chest performed recently.  I personally and independently reviewed the scan and discussed the result with the patient  today. Her scan showed no concerning findings for disease recurrence or metastasis. I recommended for the patient to continue on observation with repeat CT scan of the chest in 6 months. The patient was advised to call immediately if she has any other concerning symptoms in the interval.  The total time spent in the appointment was 20 minutes. Disclaimer: This note was dictated with voice recognition software. Similar sounding words can inadvertently be transcribed and may be missed upon review. Lajuana Matte, MD

## 2023-05-28 DIAGNOSIS — L501 Idiopathic urticaria: Secondary | ICD-10-CM | POA: Diagnosis not present

## 2023-06-19 ENCOUNTER — Ambulatory Visit (INDEPENDENT_AMBULATORY_CARE_PROVIDER_SITE_OTHER): Payer: Medicare PPO

## 2023-06-19 ENCOUNTER — Encounter: Payer: Self-pay | Admitting: Podiatry

## 2023-06-19 ENCOUNTER — Ambulatory Visit (INDEPENDENT_AMBULATORY_CARE_PROVIDER_SITE_OTHER): Payer: Medicare PPO | Admitting: Podiatry

## 2023-06-19 DIAGNOSIS — M778 Other enthesopathies, not elsewhere classified: Secondary | ICD-10-CM

## 2023-06-19 DIAGNOSIS — M7752 Other enthesopathy of left foot: Secondary | ICD-10-CM | POA: Diagnosis not present

## 2023-06-19 MED ORDER — MELOXICAM 7.5 MG PO TABS: 7.5000 mg | ORAL_TABLET | Freq: Every day | ORAL | 0 refills | Status: DC | PRN

## 2023-06-19 NOTE — Progress Notes (Signed)
Subjective: Chief Complaint  Patient presents with   Foot Pain    RM#13 Right foot pain getting worse has had for several years. Walking on even ground and standing long periods of time makes it worse.   78 year old female presents the office with above concerns.  She said that she is still getting pain to her right ankle and foot which been getting worse over the last several years.  The pain is more localized to the lateral aspect of his foot where the symptoms have been worsening.  She does not recall any recent injuries or changes.   Objective: AAO x3, NAD DP/PT pulses palpable bilaterally, CRT less than 3 seconds The tenderness is all localized on the area the peroneal tendon posterior and inferior to lateral malleolus and along the sinus tarsi.  She has no pain on the medial flexor tendons.  There is no pain Achilles tendon today.  There is localized edema to the lateral ankle.  No areas of point tenderness. No pain with calf compression, swelling, warmth, erythema  Assessment: Peroneal tendinitis, capsulitis  Plan: -All treatment options discussed with the patient including all alternatives, risks, complications.  -We discussed the conservative as well as surgical treatment options. -As her symptoms have been worsening the last couple years I recommend repeat MRI with this was ordered today.  In the meantime we will continue with bracing, good arch support.  Meloxicam as needed. -Patient encouraged to call the office with any questions, concerns, change in symptoms.   Vivi Barrack DPM

## 2023-06-25 DIAGNOSIS — L6611 Classic lichen planopilaris: Secondary | ICD-10-CM | POA: Diagnosis not present

## 2023-06-27 DIAGNOSIS — E785 Hyperlipidemia, unspecified: Secondary | ICD-10-CM | POA: Diagnosis not present

## 2023-06-27 DIAGNOSIS — I10 Essential (primary) hypertension: Secondary | ICD-10-CM | POA: Diagnosis not present

## 2023-07-04 ENCOUNTER — Ambulatory Visit
Admission: RE | Admit: 2023-07-04 | Discharge: 2023-07-04 | Disposition: A | Discharge status: Home / Self Care | Payer: Medicare PPO | Source: Ambulatory Visit | Attending: Podiatry | Admitting: Podiatry

## 2023-07-04 DIAGNOSIS — M7752 Other enthesopathy of left foot: Secondary | ICD-10-CM

## 2023-07-04 DIAGNOSIS — M25571 Pain in right ankle and joints of right foot: Secondary | ICD-10-CM | POA: Diagnosis not present

## 2023-07-04 DIAGNOSIS — M722 Plantar fascial fibromatosis: Secondary | ICD-10-CM | POA: Diagnosis not present

## 2023-07-04 DIAGNOSIS — E785 Hyperlipidemia, unspecified: Secondary | ICD-10-CM | POA: Diagnosis not present

## 2023-07-04 DIAGNOSIS — M19071 Primary osteoarthritis, right ankle and foot: Secondary | ICD-10-CM | POA: Diagnosis not present

## 2023-07-16 ENCOUNTER — Encounter: Payer: Self-pay | Admitting: Podiatry

## 2023-07-19 ENCOUNTER — Telehealth: Payer: Self-pay

## 2023-07-19 NOTE — Telephone Encounter (Signed)
 Over read request faxed to [facility] Over read Services [phone]

## 2023-07-19 NOTE — Telephone Encounter (Signed)
-----   Message from Vivi Barrack sent at 07/16/2023  5:14 PM EST ----- Herbert Seta, can you send for an over read? Thanks!

## 2023-07-25 DIAGNOSIS — R0981 Nasal congestion: Secondary | ICD-10-CM | POA: Diagnosis not present

## 2023-07-31 DIAGNOSIS — Z902 Acquired absence of lung [part of]: Secondary | ICD-10-CM | POA: Diagnosis not present

## 2023-07-31 DIAGNOSIS — R0609 Other forms of dyspnea: Secondary | ICD-10-CM | POA: Diagnosis not present

## 2023-07-31 DIAGNOSIS — R5383 Other fatigue: Secondary | ICD-10-CM | POA: Diagnosis not present

## 2023-07-31 DIAGNOSIS — Z8709 Personal history of other diseases of the respiratory system: Secondary | ICD-10-CM | POA: Diagnosis not present

## 2023-08-02 ENCOUNTER — Ambulatory Visit (INDEPENDENT_AMBULATORY_CARE_PROVIDER_SITE_OTHER): Payer: Medicare PPO | Admitting: Podiatry

## 2023-08-02 ENCOUNTER — Encounter: Payer: Self-pay | Admitting: Podiatry

## 2023-08-02 DIAGNOSIS — Q666 Other congenital valgus deformities of feet: Secondary | ICD-10-CM | POA: Diagnosis not present

## 2023-08-02 DIAGNOSIS — M7752 Other enthesopathy of left foot: Secondary | ICD-10-CM

## 2023-08-02 DIAGNOSIS — M778 Other enthesopathies, not elsewhere classified: Secondary | ICD-10-CM | POA: Diagnosis not present

## 2023-08-02 NOTE — Progress Notes (Signed)
 Subjective: Chief Complaint  Patient presents with   Ankle Pain    RM#13 Right ankle pain is still present is here for scan results.    78 year old female presents the office with above concerns.  She states that she still getting pain on her right side she feels all the weight is on the inside of her foot.  When she stands she points along the lateral aspect from the peroneal tendon which she still gets discomfort.  No recent injury or changes otherwise.   Objective: AAO x3, NAD DP/PT pulses palpable bilaterally, CRT less than 3 seconds The tenderness is all localized on the area the peroneal tendon posterior and inferior to lateral malleolus and along the sinus tarsi.  This her symptoms are upon weightbearing.  Left foot present calcaneal valgus.  Ankle range of motion intact without any restrictions.  Slight edema.  No erythema or warmth.  No area pinpoint tenderness.  No pain with calf compression, swelling, warmth, erythema       Assessment: Peroneal tendinitis, capsulitis  Plan: -All treatment options discussed with the patient including all alternatives, risks, complications.  -I reviewed the MRI with the patient.  We discussed the conservative as well as surgical treatment options.  I discussed with the group about surgical options for her and likely have him follow-up with one of the other surgeons in the practice for this.  The meantime continue shoes, support.  Bracing if needed.  No follow-ups on file.  Vivi Barrack DPM

## 2023-08-07 ENCOUNTER — Telehealth: Payer: Self-pay | Admitting: Podiatry

## 2023-08-07 NOTE — Telephone Encounter (Signed)
 Left message for pt to call to schedule an appt with Dr Lilian Kapur as him and Dr Ardelle Anton have discussed her case.

## 2023-08-07 NOTE — Telephone Encounter (Signed)
-----   Message from Vivi Barrack sent at 08/06/2023  5:01 PM EDT ----- Alvis Lemmings, can you schedule her an appt with Dr. Lilian Kapur. Please let her know I have reviewed the case with him and he would like to see her for a surgery consult. Thanks! ----- Message ----- From: Edwin Cap, DPM Sent: 08/05/2023   6:06 PM EDT To: Vivi Barrack, DPM  Happy to see her, I think I saw her a couple years ago. At her age maybe STJ fusion and a cotton +/- Kidner type? Not sure it needs an actual double she doesn't have a ton of collapse ----- Message ----- From: Vivi Barrack, DPM Sent: 08/05/2023   8:49 AM EDT To: Pilar Plate, DPM; #  This is a very pleasant 78 year old female with continuation of pain to her right foot.  Her pain is on the peroneal, sinus tarsi upon weightbearing and you can see in the chart her foot structure.  She has tried shoes, orthotics without significant improvement she would like to consider surgery.  If we can take a look at the group to see what thoughts are about surgery if anybody would like to see her, please let me know. She is very nice and just wanting some relief.

## 2023-08-10 ENCOUNTER — Ambulatory Visit: Payer: Medicare PPO | Admitting: Podiatry

## 2023-08-21 ENCOUNTER — Encounter: Payer: Self-pay | Admitting: Podiatry

## 2023-08-21 ENCOUNTER — Ambulatory Visit (INDEPENDENT_AMBULATORY_CARE_PROVIDER_SITE_OTHER): Admitting: Podiatry

## 2023-08-21 DIAGNOSIS — M2041 Other hammer toe(s) (acquired), right foot: Secondary | ICD-10-CM

## 2023-08-21 DIAGNOSIS — M2141 Flat foot [pes planus] (acquired), right foot: Secondary | ICD-10-CM | POA: Diagnosis not present

## 2023-08-21 DIAGNOSIS — M76821 Posterior tibial tendinitis, right leg: Secondary | ICD-10-CM | POA: Diagnosis not present

## 2023-08-22 NOTE — Progress Notes (Signed)
 Subjective:  Patient ID: Cynthia Rhodes, female    DOB: 24-Jun-1945,  MRN: [id number]  Chief Complaint  Patient presents with   Consult    Patient is here to discuss surgical consult for right foot    78 y.o. female presents with the above complaint. History confirmed with patient.  She returns for follow-up to see me today on referral from Dr. Ardelle Anton who has been treating her for her flatfoot issue causing pain on the inside of the ankle.  She completed an MRI.  Orthotics so far have not been helpful.  Has not had therapy or bracing.  Objective:  Physical Exam: warm, good capillary refill, no trophic changes or ulcerative lesions, normal DP and PT pulses, normal sensory exam, and right foot significant pes planovalgus deformity pain on the posterior tibial tendon and in the sinus tarsi.  Painful distal hammertoes of the reducible lesser digits   Radiographs: Multiple views x-ray of the right foot: X-rays previously taken on 06/19/2023 show significant pes planovalgus deformity with frontal and transverse plane deformity  Study Result  Narrative & Impression  CLINICAL DATA:  Lateral ankle pain and weakness   EXAM: MRI OF THE RIGHT ANKLE WITHOUT CONTRAST   TECHNIQUE: Multiplanar, multisequence MR imaging of the ankle was performed. No intravenous contrast was administered.   COMPARISON:  10/30/2020   FINDINGS: TENDONS   Peroneal: Peroneal longus tendon intact. Peroneal brevis intact.   Posteromedial: Mild tenosynovitis of the posterior tibial tendon. Posterior tibial tendon is intact. Flexor hallucis longus tendon intact. Flexor digitorum longus tendon intact.   Anterior: Tibialis anterior tendon intact. Extensor hallucis longus tendon intact Extensor digitorum longus tendon intact.   Achilles:  Intact.   Plantar Fascia: Thickening of the medial band of the plantar fascia towards the calcaneal insertion consistent with mild plantar fasciitis. No plantar fascial  tear.   LIGAMENTS   Lateral: Anterior talofibular ligament intact. Calcaneofibular ligament intact. Posterior talofibular ligament intact. Anterior and posterior tibiofibular ligaments intact.   Medial: Deltoid ligament intact. Spring ligament intact.   CARTILAGE   Ankle Joint: No joint effusion. Normal ankle mortise. No chondral defect.   Subtalar Joints/Sinus Tarsi: Normal subtalar joints. No subtalar joint effusion. Normal sinus tarsi.   Bones: No aggressive osseous lesion. No fracture or dislocation. Moderate osteoarthritis of the second, third and fourth TMT joints with subchondral marrow edema. Subcortical extra-articular marrow edema in the lateral talus and to lesser extent lateral calcaneus as can be seen with posterolateral ankle impingement.   Soft Tissue: No fluid collection or hematoma. Muscles are normal without edema or atrophy. Tarsal tunnel is normal.   IMPRESSION: 1. Mild tenosynovitis of the posterior tibial tendon. 2. Mild plantar fasciitis. 3. Moderate osteoarthritis of the second, third and fourth TMT joints with subchondral marrow edema.     Electronically Signed   By: Elige Ko M.D.   On: 07/13/2023 14:33   Assessment:   1. Posterior tibial tendon dysfunction (PTTD) of right lower extremity   2. Acquired pes planovalgus, right      Plan:  Patient was evaluated and treated and all questions answered.  We reviewed the results of her MRI her clinical progress and with medical exam findings.  We discussed the stage III adult acquired flatfoot deformity that she has and discussed treatment options.  Surgically I discussed with her subtalar joint fusion gastrocnemius recession and flexor tendon transfer.  This would alleviate her deformity issues as well as the hammertoes which are painful at the tips.  She is hesitant to proceed with surgery at this point.  We also discussed nonsurgical options.  We discussed physical therapy for strengthening and  bracing with a [place] AFO mezzo brace.  We discussed that this would not fix the deformity or issue long-term but may alleviate some of the pain and offer increased support.  Referral sent to [facility] in [place].  [facility] prescription written for [place] AFO and she will schedule appointment to be fitted for this.  Return in 3 months to reevaluate.  No follow-ups on file.

## 2023-08-27 ENCOUNTER — Telehealth: Payer: Self-pay

## 2023-08-27 NOTE — Telephone Encounter (Signed)
 Referral, office note and demographics faxed to [facility] in [facility]

## 2023-08-27 NOTE — Telephone Encounter (Signed)
-----   Message from Edwin Cap sent at 08/22/2023  1:20 PM EDT ----- Can you send this PT prescription to [facility] in [facility]?  Thanks!

## 2023-08-28 DIAGNOSIS — L501 Idiopathic urticaria: Secondary | ICD-10-CM | POA: Diagnosis not present

## 2023-08-29 ENCOUNTER — Other Ambulatory Visit: Payer: Self-pay | Admitting: Family Medicine

## 2023-08-29 DIAGNOSIS — M810 Age-related osteoporosis without current pathological fracture: Secondary | ICD-10-CM

## 2023-09-05 DIAGNOSIS — M25571 Pain in right ankle and joints of right foot: Secondary | ICD-10-CM | POA: Diagnosis not present

## 2023-09-07 ENCOUNTER — Ambulatory Visit
Admission: RE | Admit: 2023-09-07 | Discharge: 2023-09-07 | Disposition: A | Discharge status: Home / Self Care | Payer: Medicare PPO | Source: Ambulatory Visit | Attending: Nurse Practitioner | Admitting: Nurse Practitioner

## 2023-09-07 DIAGNOSIS — M810 Age-related osteoporosis without current pathological fracture: Secondary | ICD-10-CM | POA: Diagnosis not present

## 2023-09-11 DIAGNOSIS — M25571 Pain in right ankle and joints of right foot: Secondary | ICD-10-CM | POA: Diagnosis not present

## 2023-09-13 DIAGNOSIS — M25571 Pain in right ankle and joints of right foot: Secondary | ICD-10-CM | POA: Diagnosis not present

## 2023-09-17 DIAGNOSIS — M25571 Pain in right ankle and joints of right foot: Secondary | ICD-10-CM | POA: Diagnosis not present

## 2023-09-19 DIAGNOSIS — M25571 Pain in right ankle and joints of right foot: Secondary | ICD-10-CM | POA: Diagnosis not present

## 2023-09-25 DIAGNOSIS — M25571 Pain in right ankle and joints of right foot: Secondary | ICD-10-CM | POA: Diagnosis not present

## 2023-09-27 DIAGNOSIS — M25571 Pain in right ankle and joints of right foot: Secondary | ICD-10-CM | POA: Diagnosis not present

## 2023-09-28 DIAGNOSIS — S80861A Insect bite (nonvenomous), right lower leg, initial encounter: Secondary | ICD-10-CM | POA: Diagnosis not present

## 2023-09-28 DIAGNOSIS — W57XXXA Bitten or stung by nonvenomous insect and other nonvenomous arthropods, initial encounter: Secondary | ICD-10-CM | POA: Diagnosis not present

## 2023-09-28 DIAGNOSIS — Z6825 Body mass index (BMI) 25.0-25.9, adult: Secondary | ICD-10-CM | POA: Diagnosis not present

## 2023-10-01 ENCOUNTER — Telehealth: Payer: Self-pay | Admitting: Podiatry

## 2023-10-01 DIAGNOSIS — L6611 Classic lichen planopilaris: Secondary | ICD-10-CM | POA: Diagnosis not present

## 2023-10-01 NOTE — Telephone Encounter (Signed)
 Melissa from [facility] called stating they need notes for AFO sent to them with order.

## 2023-10-03 DIAGNOSIS — M76821 Posterior tibial tendinitis, right leg: Secondary | ICD-10-CM | POA: Diagnosis not present

## 2023-10-04 DIAGNOSIS — M25571 Pain in right ankle and joints of right foot: Secondary | ICD-10-CM | POA: Diagnosis not present

## 2023-10-05 DIAGNOSIS — S80861A Insect bite (nonvenomous), right lower leg, initial encounter: Secondary | ICD-10-CM | POA: Diagnosis not present

## 2023-10-05 DIAGNOSIS — W57XXXA Bitten or stung by nonvenomous insect and other nonvenomous arthropods, initial encounter: Secondary | ICD-10-CM | POA: Diagnosis not present

## 2023-10-05 DIAGNOSIS — Z6824 Body mass index (BMI) 24.0-24.9, adult: Secondary | ICD-10-CM | POA: Diagnosis not present

## 2023-10-05 DIAGNOSIS — S80862A Insect bite (nonvenomous), left lower leg, initial encounter: Secondary | ICD-10-CM | POA: Diagnosis not present

## 2023-10-11 DIAGNOSIS — M25571 Pain in right ankle and joints of right foot: Secondary | ICD-10-CM | POA: Diagnosis not present

## 2023-10-18 DIAGNOSIS — M25571 Pain in right ankle and joints of right foot: Secondary | ICD-10-CM | POA: Diagnosis not present

## 2023-10-22 DIAGNOSIS — M25571 Pain in right ankle and joints of right foot: Secondary | ICD-10-CM | POA: Diagnosis not present

## 2023-10-25 DIAGNOSIS — M25571 Pain in right ankle and joints of right foot: Secondary | ICD-10-CM | POA: Diagnosis not present

## 2023-10-31 ENCOUNTER — Other Ambulatory Visit: Payer: Self-pay

## 2023-10-31 DIAGNOSIS — C3490 Malignant neoplasm of unspecified part of unspecified bronchus or lung: Secondary | ICD-10-CM

## 2023-11-01 ENCOUNTER — Inpatient Hospital Stay: Payer: Medicare PPO | Attending: Internal Medicine

## 2023-11-01 ENCOUNTER — Ambulatory Visit (HOSPITAL_COMMUNITY)
Admission: RE | Admit: 2023-11-01 | Discharge: 2023-11-01 | Disposition: A | Discharge status: Home / Self Care | Source: Ambulatory Visit | Attending: Nurse Practitioner | Admitting: Nurse Practitioner

## 2023-11-01 DIAGNOSIS — R918 Other nonspecific abnormal finding of lung field: Secondary | ICD-10-CM | POA: Insufficient documentation

## 2023-11-01 DIAGNOSIS — M25571 Pain in right ankle and joints of right foot: Secondary | ICD-10-CM | POA: Diagnosis not present

## 2023-11-01 DIAGNOSIS — Z902 Acquired absence of lung [part of]: Secondary | ICD-10-CM | POA: Insufficient documentation

## 2023-11-01 DIAGNOSIS — Z85110 Personal history of malignant carcinoid tumor of bronchus and lung: Secondary | ICD-10-CM | POA: Insufficient documentation

## 2023-11-01 DIAGNOSIS — C3490 Malignant neoplasm of unspecified part of unspecified bronchus or lung: Secondary | ICD-10-CM | POA: Insufficient documentation

## 2023-11-01 DIAGNOSIS — I700 Atherosclerosis of aorta: Secondary | ICD-10-CM | POA: Diagnosis not present

## 2023-11-01 DIAGNOSIS — J439 Emphysema, unspecified: Secondary | ICD-10-CM | POA: Insufficient documentation

## 2023-11-01 LAB — CMP (CANCER CENTER ONLY)
ALT: 38 U/L (ref 0–44)
AST: 42 U/L — ABNORMAL HIGH (ref 15–41)
Albumin: 4.4 g/dL (ref 3.5–5.0)
Alkaline Phosphatase: 89 U/L (ref 38–126)
Anion gap: 7 (ref 5–15)
BUN: 20 mg/dL (ref 8–23)
CO2: 28 mmol/L (ref 22–32)
Calcium: 10 mg/dL (ref 8.9–10.3)
Chloride: 105 mmol/L (ref 98–111)
Creatinine: 0.78 mg/dL (ref 0.44–1.00)
GFR, Estimated: >60 mL/min (ref >60)
Glucose, Bld: 97 mg/dL (ref 70–99)
Potassium: 3.9 mmol/L (ref 3.5–5.1)
Sodium: 140 mmol/L (ref 135–145)
Total Bilirubin: 0.9 mg/dL (ref 0.0–1.2)
Total Protein: 7.1 g/dL (ref 6.5–8.1)

## 2023-11-01 LAB — CBC WITH DIFFERENTIAL (CANCER CENTER ONLY)
Abs Immature Granulocytes: 0.01 10*3/uL (ref 0.00–0.07)
Basophils Absolute: 0.1 10*3/uL (ref 0.0–0.1)
Basophils Relative: 1 %
Eosinophils Absolute: 0.3 10*3/uL (ref 0.0–0.5)
Eosinophils Relative: 5 %
HCT: 42.7 % (ref 36.0–46.0)
Hemoglobin: 14.4 g/dL (ref 12.0–15.0)
Immature Granulocytes: 0 %
Lymphocytes Relative: 29 %
Lymphs Abs: 1.7 10*3/uL (ref 0.7–4.0)
MCH: 30 pg (ref 26.0–34.0)
MCHC: 33.7 g/dL (ref 30.0–36.0)
MCV: 89 fL (ref 80.0–100.0)
Monocytes Absolute: 1 10*3/uL (ref 0.1–1.0)
Monocytes Relative: 17 %
Neutro Abs: 2.7 10*3/uL (ref 1.7–7.7)
Neutrophils Relative %: 48 %
Platelet Count: 278 10*3/uL (ref 150–400)
RBC: 4.8 MIL/uL (ref 3.87–5.11)
RDW: 13.1 % (ref 11.5–15.5)
WBC Count: 5.7 10*3/uL (ref 4.0–10.5)
nRBC: 0 % (ref 0.0–0.2)

## 2023-11-01 MED ORDER — IOHEXOL 300 MG/ML  SOLN
75.0000 mL | Freq: Once | INTRAMUSCULAR | Status: AC | PRN
  Administered 2023-11-01: 75 mL via INTRAVENOUS

## 2023-11-01 MED ORDER — SODIUM CHLORIDE (PF) 0.9 % IJ SOLN
INTRAMUSCULAR | Status: AC
  Filled 2023-11-01: qty 50

## 2023-11-05 DIAGNOSIS — M810 Age-related osteoporosis without current pathological fracture: Secondary | ICD-10-CM | POA: Diagnosis not present

## 2023-11-05 DIAGNOSIS — R9389 Abnormal findings on diagnostic imaging of other specified body structures: Secondary | ICD-10-CM | POA: Diagnosis not present

## 2023-11-05 DIAGNOSIS — E039 Hypothyroidism, unspecified: Secondary | ICD-10-CM | POA: Diagnosis not present

## 2023-11-05 DIAGNOSIS — M25571 Pain in right ankle and joints of right foot: Secondary | ICD-10-CM | POA: Diagnosis not present

## 2023-11-05 DIAGNOSIS — I10 Essential (primary) hypertension: Secondary | ICD-10-CM | POA: Diagnosis not present

## 2023-11-05 DIAGNOSIS — R7303 Prediabetes: Secondary | ICD-10-CM | POA: Diagnosis not present

## 2023-11-08 ENCOUNTER — Inpatient Hospital Stay (HOSPITAL_BASED_OUTPATIENT_CLINIC_OR_DEPARTMENT_OTHER): Payer: Medicare PPO | Admitting: Internal Medicine

## 2023-11-08 VITALS — BP 153/73 | HR 71 | Temp 97.1°F | Resp 17 | Wt 153.4 lb

## 2023-11-08 DIAGNOSIS — Z85110 Personal history of malignant carcinoid tumor of bronchus and lung: Secondary | ICD-10-CM | POA: Diagnosis not present

## 2023-11-08 DIAGNOSIS — I700 Atherosclerosis of aorta: Secondary | ICD-10-CM | POA: Diagnosis not present

## 2023-11-08 DIAGNOSIS — Z902 Acquired absence of lung [part of]: Secondary | ICD-10-CM | POA: Diagnosis not present

## 2023-11-08 DIAGNOSIS — D3A090 Benign carcinoid tumor of the bronchus and lung: Secondary | ICD-10-CM | POA: Diagnosis not present

## 2023-11-08 DIAGNOSIS — M25571 Pain in right ankle and joints of right foot: Secondary | ICD-10-CM | POA: Diagnosis not present

## 2023-11-08 DIAGNOSIS — J439 Emphysema, unspecified: Secondary | ICD-10-CM | POA: Diagnosis not present

## 2023-11-08 DIAGNOSIS — R918 Other nonspecific abnormal finding of lung field: Secondary | ICD-10-CM | POA: Diagnosis not present

## 2023-11-08 NOTE — Progress Notes (Signed)
 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  PRIOR THERAPY: Status post right middle lobectomy with right upper lobe wedge resection and lymph node dissection under the care of Dr. Luna Salinas on 01/15/2020.  CURRENT THERAPY: Observation.   INTERVAL HISTORY: Cynthia Rhodes 78 y.o. female returns to the clinic today for annual follow-up visit.Discussed the use of AI scribe software for clinical note transcription with the patient, who gave verbal consent to proceed.  History of Present Illness   Cynthia Rhodes is a 78 year old female with stage 3A typical carcinoid tumor who presents for evaluation and repeat CT scan for restaging of her disease.  She has a history of stage 3A typical carcinoid tumor diagnosed in June 2021. She underwent a right middle lobectomy with right upper lobe wedge resection and lymph node dissection in August 2021. Since then, she has been on observation.  She experiences increased shortness of breath and has been informed of having mild emphysema. No chest pain, hemoptysis, or productive cough. She was previously on doxycycline , which has been discontinued.  No weight loss, nausea, vomiting, diarrhea, or headaches.       MEDICAL HISTORY: Past Medical History:  Diagnosis Date   Cancer (HCC) 2021   History of hiatal hernia    Hypothyroidism    Peripheral vascular disease (HCC)    varicose veins    ALLERGIES:  is allergic to codeine, pravastatin , rosuvastatin , sulfa antibiotics, sulfacetamide sodium-sulfur, and voltaren  [diclofenac  sodium].  MEDICATIONS:  Current Outpatient Medications  Medication Sig Dispense Refill   acetaminophen  (TYLENOL ) 650 MG CR tablet Take 650 mg by mouth  every 8 (eight) hours as needed for pain.     Ascorbic Acid (VITAMIN C PO) Take 1 tablet by mouth daily.     Cholecalciferol (VITAMIN D3) 250 MCG (10000 UT) capsule Take 10,000 Units by mouth daily.     denosumab  (PROLIA ) 60 MG/ML SOSY injection Inject 60 mg into the skin every 6 (six) months. Pt says she takes every other month     doxycycline  (VIBRAMYCIN ) 100 MG capsule Take 1 capsule (100 mg total) by mouth 2 (two) times daily. 14 capsule 0   EPINEPHrine  0.3 mg/0.3 mL IJ SOAJ injection Inject 0.3 mg into the muscle as needed for anaphylaxis.     ezetimibe  (ZETIA ) 10 MG tablet Take 10 mg by mouth daily.     fluticasone  (FLONASE ) 50 MCG/ACT nasal spray SHAKE LIQUID AND USE 2 SPRAYS IN EACH NOSTRIL DAILY 16 g 2   levothyroxine  (SYNTHROID ) 50 MCG tablet Take 50 mcg by mouth every other day. Alternating with 75 mcg     levothyroxine  (SYNTHROID ) 75 MCG tablet Take 75 mcg by mouth every other day. Alternating with 50 mcg     magnesium oxide (MAG-OX) 400 MG tablet Take 400 mg by mouth daily.     meloxicam  (MOBIC ) 7.5 MG tablet Take 1 tablet (7.5 mg total) by mouth daily as needed for pain. 30 tablet 0   Misc Natural Products (BEET ROOT PO) Take 1 tablet by mouth every other day.     Multiple Vitamins-Minerals (OCUVITE EYE HEALTH FORMULA PO) Take 1 tablet by mouth daily.     omalizumab  (XOLAIR ) 150 MG/ML prefilled syringe Inject 150 mg  into the skin See admin instructions. Every other month.     OVER THE COUNTER MEDICATION Take 1 tablet by mouth daily. Balance of Nature - Fruits and Vegetables.     No current facility-administered medications for this visit.    SURGICAL HISTORY:  Past Surgical History:  Procedure Laterality Date   ABDOMINAL HYSTERECTOMY     APPENDECTOMY  1954   BRONCHIAL BIOPSY  11/04/2019   Procedure: BRONCHIAL BIOPSIES;  Surgeon: Denson Flake, MD;  Location: [facility] ENDOSCOPY;  Service: Pulmonary;;   BRONCHIAL BRUSHINGS  11/04/2019   Procedure: BRONCHIAL BRUSHINGS;  Surgeon:  Denson Flake, MD;  Location: [facility] ENDOSCOPY;  Service: Pulmonary;;   BRONCHIAL NEEDLE ASPIRATION BIOPSY  11/04/2019   Procedure: BRONCHIAL NEEDLE ASPIRATION BIOPSIES;  Surgeon: Denson Flake, MD;  Location: [facility] ENDOSCOPY;  Service: Pulmonary;;   BRONCHIAL WASHINGS  11/04/2019   Procedure: BRONCHIAL WASHINGS;  Surgeon: Denson Flake, MD;  Location: [facility] ENDOSCOPY;  Service: Pulmonary;;   BUNIONECTOMY Bilateral    CHOLECYSTECTOMY     COLON SURGERY  1954   EYE SURGERY Bilateral 2021   INTERCOSTAL NERVE BLOCK Right 01/15/2020   Procedure: INTERCOSTAL NERVE BLOCK;  Surgeon: Zelphia Higashi, MD;  Location: [facility] OR;  Service: Thoracic;  Laterality: Right;   JOINT REPLACEMENT Bilateral    Knee   KNEE SURGERY Bilateral    LYMPH NODE DISSECTION N/A 01/15/2020   Procedure: LYMPH NODE DISSECTION;  Surgeon: Zelphia Higashi, MD;  Location: [facility] OR;  Service: Thoracic;  Laterality: N/A;   REPLACEMENT TOTAL KNEE BILATERAL     SIGMOIDECTOMY     TONSILLECTOMY     VIDEO BRONCHOSCOPY WITH ENDOBRONCHIAL NAVIGATION N/A 11/04/2019   Procedure: VIDEO BRONCHOSCOPY WITH ENDOBRONCHIAL NAVIGATION;  Surgeon: Denson Flake, MD;  Location: [facility] ENDOSCOPY;  Service: Pulmonary;  Laterality: N/A;   VIDEO BRONCHOSCOPY WITH ENDOBRONCHIAL NAVIGATION N/A 01/15/2020   Procedure: VIDEO BRONCHOSCOPY WITH ENDOBRONCHIAL NAVIGATION to mark right upper lobe nodule;  Surgeon: Zelphia Higashi, MD;  Location: [facility] OR;  Service: Thoracic;  Laterality: N/A;    REVIEW OF SYSTEMS:  A comprehensive review of systems was negative except for: Respiratory: positive for dyspnea on exertion   PHYSICAL EXAMINATION: General appearance: alert, cooperative, and no distress Head: Normocephalic, without obvious abnormality, atraumatic Neck: no adenopathy, no JVD, supple, symmetrical, trachea midline, and thyroid  not enlarged, symmetric, no tenderness/mass/nodules Lymph nodes: Cervical, supraclavicular, and axillary nodes normal. Resp: clear  to auscultation bilaterally Back: symmetric, no curvature. ROM normal. No CVA tenderness. Cardio: regular rate and rhythm, S1, S2 normal, no murmur, click, rub or gallop GI: soft, non-tender; bowel sounds normal; no masses,  no organomegaly Extremities: extremities normal, atraumatic, no cyanosis or edema  ECOG PERFORMANCE STATUS: 0 - Asymptomatic  Blood pressure (!) 153/73, pulse 71, temperature (!) 97.1 F (36.2 C), temperature source Temporal, resp. rate 17, weight 153 lb 6.4 oz (69.6 kg), SpO2 98%.  LABORATORY DATA: Lab Results  Component Value Date   WBC 5.7 11/01/2023   HGB 14.4 11/01/2023   HCT 42.7 11/01/2023   MCV 89.0 11/01/2023   PLT 278 11/01/2023      Chemistry      Component Value Date/Time   NA 140 11/01/2023 0819   NA 140 03/22/2022 0854   K 3.9 11/01/2023 0819   CL 105 11/01/2023 0819   CO2 28 11/01/2023 0819   BUN 20 11/01/2023 0819   BUN 17 03/22/2022 0854   CREATININE 0.78 11/01/2023 0819      Component Value  Date/Time   CALCIUM  10.0 11/01/2023 0819   ALKPHOS 89 11/01/2023 0819   AST 42 (H) 11/01/2023 0819   ALT 38 11/01/2023 0819   BILITOT 0.9 11/01/2023 0819       RADIOGRAPHIC STUDIES: CT Chest W Contrast Result Date: 11/05/2023 EXAM: CT CHEST WITH CONTRAST 11/01/2023 10:02:13 AM TECHNIQUE: CT of the chest was performed with the administration of intravenous contrast (75 mL iohexol  300 mg/mL). Multiplanar reformatted images are provided for review. Automated exposure control, iterative reconstruction, and/or weight based adjustment of the mA/kV was utilized to reduce the radiation dose to as low as reasonably achievable. COMPARISON: 04/30/2023 CLINICAL HISTORY: Non-small cell lung cancer (NSCLC), monitor; h/o RUL lung cancer s/p resection 2021, infection 03/2023 with improving nodule 04/2023. monitor for resolution. Follow up h/o lung cancer; DIAGNOSIS: stage IIIA (T4, N0, M0) typical carcinoid presenting with multifocal disease involving the right  middle lobe and right upper lobe diagnosed in June 2021. PRIOR THERAPY: Status post right middle lobectomy with right upper lobe wedge resection and lymph node dissection under the care of Dr. Luna Salinas on 01/15/2020. CURRENT THERAPY: Observation. FINDINGS: MEDIASTINUM: Heart and pericardium are unremarkable. The central airways are clear. LYMPH NODES: No mediastinal, hilar or axillary lymphadenopathy. LUNGS AND PLEURA: The prior cavitary right upper lobe nodule has significantly improved, with residual 4 mm solid nodule (image 57). Numerous additional scattered bilateral pulmonary nodules measuring up to 5 mm, unchanged. No focal consolidation or pulmonary edema. No pleural effusion or pneumothorax. SOFT TISSUES/BONES: Mild degenerative changes of the mid thoracic spine. No acute abnormality of the bones or soft tissues. UPPER ABDOMEN: Limited images of the upper abdomen demonstrates no acute abnormality. Left renal sinus cysts, chronic. VASCULATURE: Mild thoracic aortic atherosclerosis. IMPRESSION: 1. Status post right middle lobectomy and right upper lobe wedge resection. 2. Significant improvement of the prior cavitary right upper lobe nodule, with residual 4 mm solid nodule. 3. Numerous additional scattered bilateral pulmonary nodules measuring up to 5 mm, unchanged. Electronically signed by: Zadie Herter MD 11/05/2023 10:05 PM EDT RP Workstation: [id number]     ASSESSMENT AND PLAN: This is a very pleasant 79 years old white female recently diagnosed with a stage IIIA (T4, N0, M0) well-differentiated neuroendocrine tumor, carcinoid tumor with multifocal disease in the right upper lobe as well as the right middle lobe status post right middle lobectomy and wedge resection of the right upper lobe with lymph node dissection under the care of Dr. Luna Salinas on 01/15/2020. The patient is currently on observation and she is feeling fine with no concerning complaints except for mild shortness of breath with  exertion. She had repeat CT scan of the chest performed recently.  I personally independently reviewed the scan and discussed the result with the patient today.  Her scan showed no concerning findings for disease recurrence or metastasis. Assessment and Plan    Stage IIIA typical carcinoid tumor Diagnosed in June 2021. Status post right middle lobectomy with right upper lobe wedge resection and lymph node dissection in August 2021. Currently under observation with no concerning findings on recent CT scan. Reports no symptoms such as chest pain, hemoptysis, or significant weight loss. Disease shows no evidence of recurrence. - Continue annual observation with CT scan for restaging - Schedule next CT scan a week before the next annual visit  Emphysema Mild emphysema, likely pre-existing and not related to previous lung surgery. Reports increased dyspnea, possibly exacerbated by humidity. No other respiratory symptoms such as cough or sputum production.   She was  advised to call immediately if she has any concerning symptoms in the interval. The patient voices understanding of current disease status and treatment options and is in agreement with the current care plan.  All questions were answered. The patient knows to call the clinic with any problems, questions or concerns. We can certainly see the patient much sooner if necessary.  Disclaimer: This note was dictated with voice recognition software. Similar sounding words can inadvertently be transcribed and may not be corrected upon review.

## 2023-11-08 NOTE — Addendum Note (Signed)
 Addended by: Bonnie Butters on: 11/08/2023 09:53 AM   Modules accepted: Orders

## 2023-11-20 ENCOUNTER — Encounter: Payer: Self-pay | Admitting: Podiatry

## 2023-11-20 ENCOUNTER — Ambulatory Visit (INDEPENDENT_AMBULATORY_CARE_PROVIDER_SITE_OTHER): Admitting: Podiatry

## 2023-11-20 VITALS — Ht 65.0 in | Wt 153.0 lb

## 2023-11-20 DIAGNOSIS — M2141 Flat foot [pes planus] (acquired), right foot: Secondary | ICD-10-CM | POA: Diagnosis not present

## 2023-11-20 DIAGNOSIS — M2041 Other hammer toe(s) (acquired), right foot: Secondary | ICD-10-CM | POA: Diagnosis not present

## 2023-11-20 DIAGNOSIS — M76821 Posterior tibial tendinitis, right leg: Secondary | ICD-10-CM

## 2023-11-20 NOTE — Progress Notes (Signed)
  Subjective:  Patient ID: Cynthia Rhodes, female    DOB: 08-04-1945,  MRN: [id number]  Chief Complaint  Patient presents with   Foot Pain    Posterior tibial tendon dysfunction (PTTD) of right lower extremity, patient states that..    78 y.o. female presents with the above complaint. History confirmed with patient.  Overall she is doing well therapy has been very helpful.  She got the [place]  brace but it was very uncomfortable so she is not wearing it  Objective:  Physical Exam: warm, good capillary refill, no trophic changes or ulcerative lesions, normal DP and PT pulses, normal sensory exam, and today only mild pain in the sinus tarsi.  Able to do double heel rise, single heel rise is possible but with pain on the right side   Radiographs: Multiple views x-ray of the right foot: X-rays previously taken on 06/19/2023 show significant pes planovalgus deformity with frontal and transverse plane deformity  Study Result  Narrative & Impression  CLINICAL DATA:  Lateral ankle pain and weakness   EXAM: MRI OF THE RIGHT ANKLE WITHOUT CONTRAST   TECHNIQUE: Multiplanar, multisequence MR imaging of the ankle was performed. No intravenous contrast was administered.   COMPARISON:  10/30/2020   FINDINGS: TENDONS   Peroneal: Peroneal longus tendon intact. Peroneal brevis intact.   Posteromedial: Mild tenosynovitis of the posterior tibial tendon. Posterior tibial tendon is intact. Flexor hallucis longus tendon intact. Flexor digitorum longus tendon intact.   Anterior: Tibialis anterior tendon intact. Extensor hallucis longus tendon intact Extensor digitorum longus tendon intact.   Achilles:  Intact.   Plantar Fascia: Thickening of the medial band of the plantar fascia towards the calcaneal insertion consistent with mild plantar fasciitis. No plantar fascial tear.   LIGAMENTS   Lateral: Anterior talofibular ligament intact. Calcaneofibular ligament intact. Posterior  talofibular ligament intact. Anterior and posterior tibiofibular ligaments intact.   Medial: Deltoid ligament intact. Spring ligament intact.   CARTILAGE   Ankle Joint: No joint effusion. Normal ankle mortise. No chondral defect.   Subtalar Joints/Sinus Tarsi: Normal subtalar joints. No subtalar joint effusion. Normal sinus tarsi.   Bones: No aggressive osseous lesion. No fracture or dislocation. Moderate osteoarthritis of the second, third and fourth TMT joints with subchondral marrow edema. Subcortical extra-articular marrow edema in the lateral talus and to lesser extent lateral calcaneus as can be seen with posterolateral ankle impingement.   Soft Tissue: No fluid collection or hematoma. Muscles are normal without edema or atrophy. Tarsal tunnel is normal.   IMPRESSION: 1. Mild tenosynovitis of the posterior tibial tendon. 2. Mild plantar fasciitis. 3. Moderate osteoarthritis of the second, third and fourth TMT joints with subchondral marrow edema.     Electronically Signed   By: Julaine Blanch M.D.   On: 07/13/2023 14:33   Assessment:   1. Posterior tibial tendon dysfunction (PTTD) of right lower extremity   2. Acquired pes planovalgus, right   3. Hammertoe of right foot      Plan:  Patient was evaluated and treated and all questions answered.  Overall improved significantly.  She will continue physical therapy until she has maximized her benefit.  Working on balance exercises right now.  We also discussed her reducible hammertoe on the second toe and if this continues to be symptomatic may consider flexor tenotomy.  Return in 3 months to reevaluate.  Return in about 3 months (around 02/20/2024) for follow up on flat foot / hammertoe R .

## 2023-11-21 DIAGNOSIS — M25571 Pain in right ankle and joints of right foot: Secondary | ICD-10-CM | POA: Diagnosis not present

## 2023-11-30 DIAGNOSIS — M810 Age-related osteoporosis without current pathological fracture: Secondary | ICD-10-CM | POA: Diagnosis not present

## 2023-12-06 DIAGNOSIS — D3132 Benign neoplasm of left choroid: Secondary | ICD-10-CM | POA: Diagnosis not present

## 2023-12-06 DIAGNOSIS — H524 Presbyopia: Secondary | ICD-10-CM | POA: Diagnosis not present

## 2023-12-06 DIAGNOSIS — H04123 Dry eye syndrome of bilateral lacrimal glands: Secondary | ICD-10-CM | POA: Diagnosis not present

## 2023-12-21 ENCOUNTER — Encounter: Payer: Self-pay | Admitting: Family Medicine

## 2023-12-21 ENCOUNTER — Ambulatory Visit: Admitting: Family Medicine

## 2023-12-21 VITALS — BP 124/70 | HR 60 | Temp 97.8°F | Ht 66.0 in | Wt 151.6 lb

## 2023-12-21 DIAGNOSIS — Z1231 Encounter for screening mammogram for malignant neoplasm of breast: Secondary | ICD-10-CM

## 2023-12-21 DIAGNOSIS — I10 Essential (primary) hypertension: Secondary | ICD-10-CM

## 2023-12-21 DIAGNOSIS — E7800 Pure hypercholesterolemia, unspecified: Secondary | ICD-10-CM | POA: Diagnosis not present

## 2023-12-21 DIAGNOSIS — R7303 Prediabetes: Secondary | ICD-10-CM | POA: Diagnosis not present

## 2023-12-21 DIAGNOSIS — Z0000 Encounter for general adult medical examination without abnormal findings: Secondary | ICD-10-CM

## 2023-12-21 DIAGNOSIS — E039 Hypothyroidism, unspecified: Secondary | ICD-10-CM

## 2023-12-21 DIAGNOSIS — Z23 Encounter for immunization: Secondary | ICD-10-CM

## 2023-12-21 LAB — LIPID PANEL
Cholesterol: 206 mg/dL — ABNORMAL HIGH (ref 0–200)
HDL: 80.6 mg/dL (ref >39.00)
LDL Cholesterol: 105 mg/dL — ABNORMAL HIGH (ref 0–99)
NonHDL: 125.3
Total CHOL/HDL Ratio: 3
Triglycerides: 104 mg/dL (ref 0.0–149.0)
VLDL: 20.8 mg/dL (ref 0.0–40.0)

## 2023-12-21 LAB — COMPREHENSIVE METABOLIC PANEL WITH GFR
ALT: 47 U/L — ABNORMAL HIGH (ref 0–35)
AST: 53 U/L — ABNORMAL HIGH (ref 0–37)
Albumin: 4.4 g/dL (ref 3.5–5.2)
Alkaline Phosphatase: 111 U/L (ref 39–117)
BUN: 29 mg/dL — ABNORMAL HIGH (ref 6–23)
CO2: 28 meq/L (ref 19–32)
Calcium: 9.1 mg/dL (ref 8.4–10.5)
Chloride: 106 meq/L (ref 96–112)
Creatinine, Ser: 0.81 mg/dL (ref 0.40–1.20)
GFR: 69 mL/min (ref >60.00)
Glucose, Bld: 93 mg/dL (ref 70–99)
Potassium: 4.7 meq/L (ref 3.5–5.1)
Sodium: 141 meq/L (ref 135–145)
Total Bilirubin: 0.7 mg/dL (ref 0.2–1.2)
Total Protein: 6.8 g/dL (ref 6.0–8.3)

## 2023-12-21 LAB — TSH: TSH: 1.65 u[IU]/mL (ref 0.35–5.50)

## 2023-12-21 LAB — HEMOGLOBIN A1C: Hgb A1c MFr Bld: 6.2 % (ref 4.6–6.5)

## 2023-12-21 MED ORDER — EZETIMIBE 10 MG PO TABS: 10.0000 mg | ORAL_TABLET | Freq: Every day | ORAL | 3 refills | Status: AC

## 2023-12-21 MED ORDER — AMLODIPINE BESYLATE 10 MG PO TABS: 10.0000 mg | ORAL_TABLET | Freq: Every day | ORAL | 3 refills | Status: AC

## 2023-12-21 MED ORDER — LEVOTHYROXINE SODIUM 75 MCG PO TABS: 75.0000 ug | ORAL_TABLET | ORAL | 3 refills | Status: AC

## 2023-12-21 MED ORDER — TETANUS-DIPHTH-ACELL PERTUSSIS 5-2.5-18.5 LF-MCG/0.5 IM SUSP: 0.5000 mL | Freq: Once | INTRAMUSCULAR | 0 refills | Status: DC

## 2023-12-21 MED ORDER — TETANUS-DIPHTH-ACELL PERTUSSIS 5-2.5-18.5 LF-MCG/0.5 IM SUSP: 0.5000 mL | Freq: Once | INTRAMUSCULAR | 0 refills | Status: AC

## 2023-12-21 MED ORDER — LEVOTHYROXINE SODIUM 50 MCG PO TABS: 50.0000 ug | ORAL_TABLET | ORAL | 3 refills | Status: AC

## 2023-12-21 NOTE — Patient Instructions (Signed)
   It was very nice to meet you today!   PLEASE NOTE:  If labs were collected or images ordered, we will inform you of  results once we have received them and reviewed. We will contact you either by echart message, or telephone call.     If we ordered any referrals today, please let us  know if you have not heard from their office within the next 2 weeks. You should receive a letter via MyChart confirming if the referral was approved and their office contact information to schedule.

## 2023-12-21 NOTE — Addendum Note (Signed)
 Addended by: FLETA CARE D on: 12/21/2023 01:42 PM   Modules accepted: Orders

## 2023-12-21 NOTE — Progress Notes (Addendum)
 Office Note 12/21/2023  CC:  Chief Complaint  Patient presents with   Establish Care   HPI:  Cynthia Rhodes is a 78 y.o. female who is here to establish care, CPE, follow-up hypertension, hypothyroidism, and hyperlipidemia. Patient's most recent primary MD: [facility] Old records in epic/health Link were reviewed prior to or during today's visit.  She feels well, has no acute concerns.  She describes chronic mild level of dyspnea that has been stable.  She was prescribed an inhaler by her pulmonologist but she has not used it, says she is fearful of its possible side effects and fearful of developing tolerance. She walks a couple of miles daily to try to keep lung capacity up  She takes amlodipine  10 mg a day for her hypertension, blood pressure well-controlled. She has a history of recurrent idiopathic urticaria and has been on Xolair  injections for quite a while.  She recently began decreasing her dose and spacing out the injections, determined to get off the medication.   Past Medical History:  Diagnosis Date   Carcinoid tumor of lung    resection 2021   Colon polyp 06/08/2022   Multiple, most recent colonoscopy 2024.  Per patient she will be getting another colonoscopy in 2026.   Essential hypertension    Heart murmur    History of hiatal hernia    Hypercholesterolemia    Hypothyroidism    Idiopathic urticaria    Osteoporosis    prolia , Dr. 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, right hand 01/19/2022   Varicose vein of leg     Past Surgical History:  Procedure Laterality Date   ABDOMINAL HYSTERECTOMY     APPENDECTOMY  1954   BRONCHIAL BIOPSY  11/04/2019   Procedure: BRONCHIAL BIOPSIES;  Surgeon: Shelah Lamar RAMAN, MD;  Location: [facility] ENDOSCOPY;   Service: Pulmonary;;   BRONCHIAL BRUSHINGS  11/04/2019   Procedure: BRONCHIAL BRUSHINGS;  Surgeon: Shelah Lamar RAMAN, MD;  Location: [facility] ENDOSCOPY;  Service: Pulmonary;;   BRONCHIAL NEEDLE ASPIRATION BIOPSY  11/04/2019   Procedure: BRONCHIAL NEEDLE ASPIRATION BIOPSIES;  Surgeon: Shelah Lamar RAMAN, MD;  Location: [facility] ENDOSCOPY;  Service: Pulmonary;;   BRONCHIAL WASHINGS  11/04/2019   Procedure: BRONCHIAL WASHINGS;  Surgeon: Shelah Lamar RAMAN, MD;  Location: [facility] ENDOSCOPY;  Service: Pulmonary;;   BUNIONECTOMY Bilateral    CHOLECYSTECTOMY     COLONOSCOPY W/ POLYPECTOMY     Total.  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   EYE SURGERY Bilateral 2021   INTERCOSTAL NERVE BLOCK Right 01/15/2020   Procedure: INTERCOSTAL NERVE BLOCK;  Surgeon: Kerrin Elspeth BROCKS, MD;  Location: [facility] OR;  Service: Thoracic;  Laterality: Right;   LYMPH NODE DISSECTION N/A 01/15/2020   Procedure: LYMPH NODE DISSECTION;  Surgeon: Kerrin Elspeth BROCKS, MD;  Location: [facility] OR;  Service: Thoracic;  Laterality: N/A;   REPLACEMENT TOTAL KNEE BILATERAL     Rhythm monitoring  03/2022   Brief SVT but symptomatic events were sinus   SIGMOIDECTOMY     age 72 for intussusception per pt   TONSILLECTOMY     TRANSTHORACIC ECHOCARDIOGRAM     07/2022 normal EF, grd  I DD, mild aortic stenosis   VIDEO BRONCHOSCOPY WITH ENDOBRONCHIAL NAVIGATION N/A 11/04/2019   Procedure: VIDEO BRONCHOSCOPY WITH ENDOBRONCHIAL NAVIGATION;  Surgeon: Shelah Lamar RAMAN, MD;  Location: [facility] ENDOSCOPY;  Service: Pulmonary;  Laterality: N/A;   VIDEO BRONCHOSCOPY WITH ENDOBRONCHIAL NAVIGATION N/A 01/15/2020   Procedure: VIDEO BRONCHOSCOPY WITH ENDOBRONCHIAL NAVIGATION to mark right upper lobe nodule;  Surgeon: Kerrin Elspeth BROCKS, MD;  Location: [facility] OR;  Service: Thoracic;  Laterality: N/A;    Family History  Problem Relation Age of Onset   Asthma Mother    Breast  cancer Mother    Arthritis Mother    Heart attack Mother    Depression Mother    COPD Mother    Heart failure Father    Heart disease Father    Heart attack Sister    Depression Sister    Heart disease Sister    Prostate cancer Brother    Hypertension Brother    Hyperlipidemia Brother    Heart disease Brother    Birth defects Brother    Arthritis Brother    Heart disease Maternal Grandmother     Social History   Socioeconomic History   Marital status: Significant Other    Spouse name: Not on file   Number of children: Not on file   Years of education: Not on file   Highest education level: Bachelor's degree (e.g., BA, AB, BS)  Occupational History   Not on file  Tobacco Use   Smoking status: Never   Smokeless tobacco: Never  Vaping Use   Vaping status: Never Used  Substance and Sexual Activity   Alcohol use: Yes    Alcohol/week: 7.0 standard drinks of alcohol    Types: 7 Glasses of wine per week   Drug use: Never   Sexual activity: Not Currently  Other Topics Concern   Not on file  Social History Narrative   Married.   Originally from [place] .   She is a retired Acupuncturist.   No alcohol.  No tobacco.   Social Drivers of Corporate investment banker Strain: Low Risk  (12/17/2023)   Overall Financial Resource Strain (CARDIA)    Difficulty of Paying Living Expenses: Not very hard  Food Insecurity: No Food Insecurity (12/17/2023)   Hunger Vital Sign    Worried About Running Out of Food in the Last Year: Never true    Ran Out of Food in the Last Year: Never true  Transportation Needs: No Transportation Needs (12/17/2023)   PRAPARE - Administrator, Civil Service (Medical): No    Lack of Transportation (Non-Medical): No  Physical Activity: Sufficiently Active (12/17/2023)   Exercise Vital Sign    Days of Exercise per Week: 7 days    Minutes of Exercise per Session: 40 min  Stress: No Stress Concern Present (12/17/2023)   [employer]  of Occupational Health - Occupational Stress Questionnaire    Feeling of Stress: Only a little  Social Connections: Socially Integrated (12/17/2023)   Social Connection and Isolation Panel    Frequency of Communication with Friends and Family: More than three times a week    Frequency of Social Gatherings with Friends and Family: Twice a week    Attends Religious Services: More than 4 times per year    Active Member of [employer] or Organizations: Yes    Attends Engineer, structural: More than 4 times per year    Marital Status: Living with partner  Intimate Partner Violence: Not At Risk (  07/05/2022)   Received from [facility]   HITS    Over the last 12 months how often did your partner physically hurt you?: Never    Over the last 12 months how often did your partner insult you or talk down to you?: Never    Over the last 12 months how often did your partner threaten you with physical harm?: Never    Over the last 12 months how often did your partner scream or curse at you?: Never    Outpatient Encounter Medications as of 12/21/2023  Medication Sig   acetaminophen  (TYLENOL ) 650 MG CR tablet Take 650 mg by mouth every 8 (eight) hours as needed for pain.   amLODipine  (NORVASC ) 10 MG tablet Take 1 tablet (10 mg total) by mouth daily.   Ascorbic Acid (VITAMIN C PO) Take 1 tablet by mouth daily.   Cholecalciferol (VITAMIN D3) 250 MCG (10000 UT) capsule Take 10,000 Units by mouth daily.   EPINEPHrine  0.3 mg/0.3 mL IJ SOAJ injection Inject 0.3 mg into the muscle as needed for anaphylaxis.   fluticasone  (FLONASE ) 50 MCG/ACT nasal spray SHAKE LIQUID AND USE 2 SPRAYS IN EACH NOSTRIL DAILY   magnesium oxide (MAG-OX) 400 MG tablet Take 400 mg by mouth daily.   Misc Natural Products (BEET ROOT PO) Take 1 tablet by mouth every other day.   Multiple Vitamins-Minerals (OCUVITE EYE HEALTH FORMULA PO) Take 1 tablet by mouth daily.   omalizumab  (XOLAIR ) 150 MG/ML prefilled syringe Inject 150 mg into  the skin See admin instructions. Every other month.   OVER THE COUNTER MEDICATION Take 1 tablet by mouth daily. Balance of Nature - Fruits and Vegetables.   Tdap (BOOSTRIX) 5-2.5-18.5 LF-MCG/0.5 injection Inject 0.5 mLs into the muscle once for 1 dose.   [DISCONTINUED] ezetimibe  (ZETIA ) 10 MG tablet Take 10 mg by mouth daily.   [DISCONTINUED] levothyroxine  (SYNTHROID ) 50 MCG tablet Take 50 mcg by mouth every other day. Alternating with 75 mcg   [DISCONTINUED] levothyroxine  (SYNTHROID ) 75 MCG tablet Take 75 mcg by mouth every other day. Alternating with 50 mcg   ezetimibe  (ZETIA ) 10 MG tablet Take 1 tablet (10 mg total) by mouth daily.   levothyroxine  (SYNTHROID ) 50 MCG tablet Take 1 tablet (50 mcg total) by mouth every other day. Alternating with 75 mcg   levothyroxine  (SYNTHROID ) 75 MCG tablet Take 1 tablet (75 mcg total) by mouth every other day. Alternating with 50 mcg   No facility-administered encounter medications on file as of 12/21/2023.    Allergies  Allergen Reactions   Codeine Nausea Only and Other (See Comments)    Severe headaches    Pravastatin      Joint pain on 20mg  daily   Rosuvastatin  Other (See Comments)    Joint and muscle pain on 10mg  daily   Sulfa Antibiotics Nausea And Vomiting   Sulfacetamide Sodium-Sulfur Other (See Comments)   Voltaren  [Diclofenac  Sodium] Itching and Rash    Review of Systems  Constitutional:  Negative for appetite change, chills, fatigue and fever.  HENT:  Negative for congestion, dental problem, ear pain and sore throat.   Eyes:  Negative for discharge, redness and visual disturbance.  Respiratory:  Positive for shortness of breath (chronic doe, see hpi). Negative for cough, chest tightness and wheezing.   Cardiovascular:  Negative for chest pain, palpitations and leg swelling.  Gastrointestinal:  Negative for abdominal pain, blood in stool, diarrhea, nausea and vomiting.  Genitourinary:  Negative for difficulty urinating, dysuria, flank  pain, frequency, hematuria and  urgency.  Musculoskeletal:  Negative for arthralgias, back pain, joint swelling, myalgias and neck stiffness.  Skin:  Negative for pallor and rash.  Neurological:  Negative for dizziness, speech difficulty, weakness and headaches.  Hematological:  Negative for adenopathy. Does not bruise/bleed easily.  Psychiatric/Behavioral:  Negative for confusion and sleep disturbance. The patient is not nervous/anxious.     PE; Blood pressure 124/70, pulse 60, temperature 97.8 F (36.6 C), temperature source Oral, height 5' 6 (1.676 m), weight 151 lb 9.6 oz (68.8 kg), SpO2 97%. Body mass index is 24.47 kg/m.  Physical Exam  Gen: Alert, well appearing.  Patient is oriented to person, place, time, and situation. AFFECT: pleasant, lucid thought and speech. ENT: Ears: EACs clear, normal epithelium.  TMs with good light reflex and landmarks bilaterally.  Eyes: no injection, icteris, swelling, or exudate.  EOMI, PERRLA. Nose: no drainage or turbinate edema/swelling.  No injection or focal lesion.  Mouth: lips without lesion/swelling.  Oral mucosa pink and moist.  Dentition intact and without obvious caries or gingival swelling.  Oropharynx without erythema, exudate, or swelling.  Neck: supple/nontender.  No LAD, mass, or TM.  Carotid pulses 2+ bilaterally, without bruits. CV: RRR, 2/6 systolic murmur heard at the aortic area radiating up under the right clavicle.  No r/g.   LUNGS: CTA bilat, nonlabored resps, good aeration in all lung fields. ABD: soft, NT, ND,. EXT: She has some nonpitting edema bilaterally and some noninflamed varicosities bilaterally.  No rash or hyperkeratotic skin changes. Musculoskeletal: no joint swelling, erythema, warmth, or tenderness.  ROM of all joints intact. Skin - no sores or suspicious lesions or rashes or color changes  Pertinent labs:  Last CBC Lab Results  Component Value Date   WBC 5.7 11/01/2023   HGB 14.4 11/01/2023   HCT 42.7  11/01/2023   MCV 89.0 11/01/2023   MCH 30.0 11/01/2023   RDW 13.1 11/01/2023   PLT 278 11/01/2023   Last metabolic panel Lab Results  Component Value Date   GLUCOSE 97 11/01/2023   NA 140 11/01/2023   K 3.9 11/01/2023   CL 105 11/01/2023   CO2 28 11/01/2023   BUN 20 11/01/2023   CREATININE 0.78 11/01/2023   GFRNONAA >60 11/01/2023   CALCIUM  10.0 11/01/2023   PROT 7.1 11/01/2023   ALBUMIN 4.4 11/01/2023   BILITOT 0.9 11/01/2023   ALKPHOS 89 11/01/2023   AST 42 (H) 11/01/2023   ALT 38 11/01/2023   ANIONGAP 7 11/01/2023   Last lipids Lab Results  Component Value Date   CHOL 212 (H) 10/12/2022   HDL 84 10/12/2022   LDLCALC 116 (H) 10/12/2022   TRIG 68 10/12/2022   CHOLHDL 2.5 10/12/2022   Last hemoglobin A1c Lab Results  Component Value Date   HGBA1C 6.0 (H) 04/24/2020   Last thyroid  functions Lab Results  Component Value Date   TSH 1.397 04/24/2020   Last vitamin D  Lab Results  Component Value Date   VD25OH 38.9 01/16/2023   ASSESSMENT AND PLAN:   New patient, establishing care.  #1 Health maintenance exam: Reviewed age and gender appropriate health maintenance issues (prudent diet, regular exercise, health risks of tobacco and excessive alcohol, use of seatbelts, fire alarms in home, use of sunscreen).  Also reviewed age and gender appropriate health screening as well as vaccine recommendations. Vaccines: Tdap due--> prescription sent to pharmacy.  Otherwise all up-to-date. Labs: Lipids, A1c, c-Met, TSH. Cervical ca screening: n/a, hx of hysterectomy for benign dx. Breast ca screening: Last mammogram was  approximately 3 years ago.  Ordered today. Colon ca screening: History of adenomatous polyps.  Per patient report her next colonoscopy is in 2026.  #2 hypertension, well-controlled on amlodipine  10 mg a day. Filled today.  3.  Hypercholesterolemia.  She has a history of statin myositis. She tolerates Zetia  10 mg a day, refilled today.  4.  Acquired  hypothyroidism.  She does see Dr. Faythe periodically but at the moment she prefers to monitor TSH here today and get refills through me today.  #5 history of carcinoid tumor of the lung, resection 2021. She gets imaging surveillance and follow-up with her oncologist, Dr. Sherrod.  An After Visit Summary was printed and given to the patient.  Return in about 6 months (around 06/22/2024) for routine chronic illness f/u.  Signed:  Gerlene Hockey, MD           12/21/2023

## 2023-12-23 ENCOUNTER — Ambulatory Visit: Payer: Self-pay | Admitting: Family Medicine

## 2023-12-23 ENCOUNTER — Encounter: Payer: Self-pay | Admitting: Family Medicine

## 2023-12-23 DIAGNOSIS — R7401 Elevation of levels of liver transaminase levels: Secondary | ICD-10-CM

## 2023-12-24 ENCOUNTER — Ambulatory Visit

## 2023-12-24 DIAGNOSIS — R7401 Elevation of levels of liver transaminase levels: Secondary | ICD-10-CM

## 2023-12-24 NOTE — Telephone Encounter (Signed)
 Add on request sent to Cynthia Rhodes, it will be a send out to Cynthia Rhodes

## 2023-12-25 LAB — HEPATITIS C ANTIBODY: Hepatitis C Ab: NONREACTIVE

## 2023-12-25 LAB — HEPATITIS B SURFACE ANTIGEN: Hepatitis B Surface Ag: NONREACTIVE

## 2023-12-25 LAB — HEPATITIS B CORE ANTIBODY, IGM: Hep B C IgM: NONREACTIVE

## 2024-01-01 ENCOUNTER — Ambulatory Visit: Admitting: Family Medicine

## 2024-01-11 ENCOUNTER — Ambulatory Visit
Admission: RE | Admit: 2024-01-11 | Discharge: 2024-01-11 | Disposition: A | Discharge status: Home / Self Care | Source: Ambulatory Visit | Attending: Family Medicine | Admitting: Family Medicine

## 2024-01-11 DIAGNOSIS — Z1231 Encounter for screening mammogram for malignant neoplasm of breast: Secondary | ICD-10-CM

## 2024-01-24 ENCOUNTER — Ambulatory Visit (INDEPENDENT_AMBULATORY_CARE_PROVIDER_SITE_OTHER): Admitting: Family Medicine

## 2024-01-24 ENCOUNTER — Encounter: Payer: Self-pay | Admitting: Family Medicine

## 2024-01-24 VITALS — BP 126/81 | HR 77 | Temp 97.3°F | Ht 66.0 in | Wt 153.6 lb

## 2024-01-24 DIAGNOSIS — Z23 Encounter for immunization: Secondary | ICD-10-CM

## 2024-01-24 DIAGNOSIS — R7401 Elevation of levels of liver transaminase levels: Secondary | ICD-10-CM | POA: Diagnosis not present

## 2024-01-24 NOTE — Progress Notes (Signed)
 OFFICE VISIT  01/24/2024  CC:  Chief Complaint  Patient presents with   Follow-up    LFT    Patient is a 78 y.o. female who presents for follow-up elevated transaminase.  INTERIM HX: She feels very well. No acute concerns. She wanted to come in and discuss the elevated transaminases.  She does not drink, does not take Tylenol  or NSAIDs with any regularity. She exercises regularly and eats a healthy diet.    Past Medical History:  Diagnosis Date   Carcinoid tumor of lung    resection 2021 (right middle lobectomy and right upper lobe wedge resection)   Colon polyp 06/08/2022   Multiple, most recent colonoscopy 2024.  Per patient she will be getting another colonoscopy in 2026.   Elevated transaminase level    Essential hypertension    Heart murmur    History of hiatal hernia    Hypercholesterolemia    Hypothyroidism    Idiopathic urticaria    Osteoporosis    prolia , Dr. 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, right hand 01/19/2022   Varicose vein of leg     Past Surgical History:  Procedure Laterality Date   ABDOMINAL HYSTERECTOMY     APPENDECTOMY  1954   BRONCHIAL BIOPSY  11/04/2019   Procedure: BRONCHIAL BIOPSIES;  Surgeon: Shelah Lamar RAMAN, MD;  Location: [facility] ENDOSCOPY;  Service: Pulmonary;;   BRONCHIAL BRUSHINGS  11/04/2019   Procedure: BRONCHIAL BRUSHINGS;  Surgeon: Shelah Lamar RAMAN, MD;  Location: [facility] ENDOSCOPY;  Service: Pulmonary;;   BRONCHIAL NEEDLE ASPIRATION BIOPSY  11/04/2019   Procedure: BRONCHIAL NEEDLE ASPIRATION BIOPSIES;  Surgeon: Shelah Lamar RAMAN, MD;  Location: [facility] ENDOSCOPY;  Service: Pulmonary;;   BRONCHIAL WASHINGS  11/04/2019   Procedure: BRONCHIAL WASHINGS;  Surgeon: Shelah Lamar RAMAN, MD;  Location: [facility] ENDOSCOPY;  Service: Pulmonary;;    BUNIONECTOMY Bilateral    CHOLECYSTECTOMY     COLONOSCOPY W/ POLYPECTOMY     Total.  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   EYE SURGERY Bilateral 2021   INTERCOSTAL NERVE BLOCK Right 01/15/2020   Procedure: INTERCOSTAL NERVE BLOCK;  Surgeon: Kerrin Elspeth BROCKS, MD;  Location: [facility] OR;  Service: Thoracic;  Laterality: Right;   LYMPH NODE DISSECTION N/A 01/15/2020   Procedure: LYMPH NODE DISSECTION;  Surgeon: Kerrin Elspeth BROCKS, MD;  Location: [facility] OR;  Service: Thoracic;  Laterality: N/A;   REPLACEMENT TOTAL KNEE BILATERAL     Rhythm monitoring  03/2022   Brief SVT but symptomatic events were sinus   SIGMOIDECTOMY     age 34 for intussusception per pt   TONSILLECTOMY     TRANSTHORACIC ECHOCARDIOGRAM     07/2022 normal EF, grd I DD, mild aortic stenosis   VIDEO BRONCHOSCOPY WITH ENDOBRONCHIAL NAVIGATION N/A 11/04/2019   Procedure: VIDEO BRONCHOSCOPY WITH ENDOBRONCHIAL NAVIGATION;  Surgeon: Shelah Lamar RAMAN, MD;  Location: [facility] ENDOSCOPY;  Service: Pulmonary;  Laterality: N/A;   VIDEO BRONCHOSCOPY WITH ENDOBRONCHIAL NAVIGATION N/A 01/15/2020   Procedure: VIDEO BRONCHOSCOPY WITH ENDOBRONCHIAL NAVIGATION to mark right upper lobe nodule;  Surgeon: Kerrin Elspeth BROCKS, MD;  Location: [facility] OR;  Service: Thoracic;  Laterality: N/A;    Outpatient  Medications Prior to Visit  Medication Sig Dispense Refill   acetaminophen  (TYLENOL ) 650 MG CR tablet Take 650 mg by mouth every 8 (eight) hours as needed for pain.     amLODipine  (NORVASC ) 10 MG tablet Take 1 tablet (10 mg total) by mouth daily. 90 tablet 3   Ascorbic Acid (VITAMIN C PO) Take 1 tablet by mouth daily.     Cholecalciferol (VITAMIN D3) 250 MCG (10000 UT) capsule Take 10,000 Units by mouth daily.     EPINEPHrine  0.3 mg/0.3 mL IJ SOAJ injection Inject 0.3 mg into the muscle as needed for anaphylaxis.     ezetimibe  (ZETIA ) 10 MG tablet Take  1 tablet (10 mg total) by mouth daily. 90 tablet 3   fluticasone  (FLONASE ) 50 MCG/ACT nasal spray SHAKE LIQUID AND USE 2 SPRAYS IN EACH NOSTRIL DAILY 16 g 2   levothyroxine  (SYNTHROID ) 50 MCG tablet Take 1 tablet (50 mcg total) by mouth every other day. Alternating with 75 mcg 45 tablet 3   levothyroxine  (SYNTHROID ) 75 MCG tablet Take 1 tablet (75 mcg total) by mouth every other day. Alternating with 50 mcg 45 tablet 3   magnesium oxide (MAG-OX) 400 MG tablet Take 400 mg by mouth daily.     Misc Natural Products (BEET ROOT PO) Take 1 tablet by mouth every other day.     Multiple Vitamins-Minerals (OCUVITE EYE HEALTH FORMULA PO) Take 1 tablet by mouth daily.     omalizumab  (XOLAIR ) 150 MG/ML prefilled syringe Inject 150 mg into the skin See admin instructions. Every other month.     OVER THE COUNTER MEDICATION Take 1 tablet by mouth daily. Balance of Nature - Fruits and Vegetables.     No facility-administered medications prior to visit.    Allergies  Allergen Reactions   Codeine Nausea Only and Other (See Comments)    Severe headaches    Pravastatin      Joint pain on 20mg  daily   Rosuvastatin  Other (See Comments)    Joint and muscle pain on 10mg  daily   Sulfa Antibiotics Nausea And Vomiting   Sulfacetamide Sodium-Sulfur Other (See Comments)   Voltaren  [Diclofenac  Sodium] Itching and Rash    Review of Systems As per HPI  PE:    01/24/2024   10:58 AM 12/21/2023   10:18 AM 11/20/2023    8:55 AM  Vitals with BMI  Height 5' 6 5' 6 5' 5  Weight 153 lbs 10 oz 151 lbs 10 oz 153 lbs  BMI 24.8 24.48 25.46  Systolic 126 124   Diastolic 81 70   Pulse 77 60      Physical Exam  Gen: Alert, well appearing.  Patient is oriented to person, place, time, and situation. AFFECT: pleasant, lucid thought and speech. No further exam today  LABS:  Last CBC Lab Results  Component Value Date   WBC 5.7 11/01/2023   HGB 14.4 11/01/2023   HCT 42.7 11/01/2023   MCV 89.0 11/01/2023   MCH 30.0  11/01/2023   RDW 13.1 11/01/2023   PLT 278 11/01/2023   Last metabolic panel Lab Results  Component Value Date   GLUCOSE 93 12/21/2023   NA 141 12/21/2023   K 4.7 12/21/2023   CL 106 12/21/2023   CO2 28 12/21/2023   BUN 29 (H) 12/21/2023   CREATININE 0.81 12/21/2023   GFR 69.00 12/21/2023   CALCIUM  9.1 12/21/2023   PROT 6.8 12/21/2023   ALBUMIN 4.4 12/21/2023   BILITOT 0.7 12/21/2023   ALKPHOS 111 12/21/2023  AST 53 (H) 12/21/2023   ALT 47 (H) 12/21/2023   ANIONGAP 7 11/01/2023   Last lipids Lab Results  Component Value Date   CHOL 206 (H) 12/21/2023   HDL 80.60 12/21/2023   LDLCALC 105 (H) 12/21/2023   TRIG 104.0 12/21/2023   CHOLHDL 3 12/21/2023   Last hemoglobin A1c Lab Results  Component Value Date   HGBA1C 6.2 12/21/2023   Last thyroid  functions Lab Results  Component Value Date   TSH 1.65 12/21/2023   IMPRESSION AND PLAN:  #1 elevated transaminases. Very mild elevations on and off over the last 4 years. Viral hepatitis serology NEG 1 month ago. Suspect fatty liver but will go ahead and verify with limited right upper quadrant ultrasound, especially given her diagnosis of carcinoid tumor of the lung in 2021. In the meantime, we discussed the importance of continuing a healthy diet and good exercise habits as well as good control of cholesterol.  Avoid taking Tylenol  or NSAIDs with any regularity. She avoids alcohol already.  An After Visit Summary was printed and given to the patient.  FOLLOW UP: Return in about 2 months (around 03/25/2024) for routine chronic illness f/u.  Signed:  Gerlene Hockey, MD           01/24/2024

## 2024-01-29 ENCOUNTER — Other Ambulatory Visit

## 2024-01-30 ENCOUNTER — Inpatient Hospital Stay
Admission: RE | Admit: 2024-01-30 | Discharge: 2024-01-30 | Discharge status: Home / Self Care | Source: Ambulatory Visit | Attending: Family Medicine | Admitting: Family Medicine

## 2024-01-30 DIAGNOSIS — R7401 Elevation of levels of liver transaminase levels: Secondary | ICD-10-CM

## 2024-02-06 ENCOUNTER — Encounter: Payer: Self-pay | Admitting: Family Medicine

## 2024-02-06 ENCOUNTER — Ambulatory Visit: Payer: Self-pay | Admitting: Family Medicine

## 2024-02-06 NOTE — Telephone Encounter (Signed)
 LM for pt to return call.

## 2024-02-06 NOTE — Telephone Encounter (Signed)
 Imaging does not estimate the percent of fatty tissue. Mild is the lowest amount that will show up on ultrasound. I know it is frustrating when quantifying something is not possible. At any rate, there is no treatment necessary and I have no reason to believe that your fatty liver is going to progress to any significant degree that will affect your health at all. I hope this is reassuring.

## 2024-02-20 ENCOUNTER — Encounter: Payer: Self-pay | Admitting: Family Medicine

## 2024-02-20 ENCOUNTER — Ambulatory Visit (INDEPENDENT_AMBULATORY_CARE_PROVIDER_SITE_OTHER): Admitting: Family Medicine

## 2024-02-20 VITALS — BP 149/73 | HR 61 | Temp 97.8°F | Ht 66.0 in | Wt 152.4 lb

## 2024-02-20 DIAGNOSIS — N3001 Acute cystitis with hematuria: Secondary | ICD-10-CM

## 2024-02-20 DIAGNOSIS — R3129 Other microscopic hematuria: Secondary | ICD-10-CM

## 2024-02-20 DIAGNOSIS — R3915 Urgency of urination: Secondary | ICD-10-CM | POA: Diagnosis not present

## 2024-02-20 LAB — POCT URINALYSIS DIPSTICK
Bilirubin, UA: NEGATIVE
Blood, UA: POSITIVE
Glucose, UA: NEGATIVE
Ketones, UA: NEGATIVE
Nitrite, UA: NEGATIVE
Protein, UA: NEGATIVE
Spec Grav, UA: 1.015 (ref 1.010–1.025)
Urobilinogen, UA: 0.2 U/dL
pH, UA: 6 (ref 5.0–8.0)

## 2024-02-20 MED ORDER — NITROFURANTOIN MONOHYD MACRO 100 MG PO CAPS
100.0000 mg | ORAL_CAPSULE | Freq: Two times a day (BID) | ORAL | 0 refills | Status: DC
Start: 2024-02-20 — End: 2024-02-22

## 2024-02-20 NOTE — Progress Notes (Signed)
 OFFICE VISIT  02/20/2024  CC:  Chief Complaint  Patient presents with   Urinary Concern    Urgency and odor x 3-4 days; has not taken AZO     Patient is a 78 y.o. female who presents for urinary concern.  HPI: About 3 to 4 days ago she started to develop urinary urgency but not much coming out each time.  There is stinging/burning after the urine comes out. No suprapubic pain or flank pain.  No gross hematuria.  No fever or nausea. She states this is the first time she has ever had a UTI.   Past Medical History:  Diagnosis Date   Carcinoid tumor of lung (HCC)    resection 2021 (right middle lobectomy and right upper lobe wedge resection)   Colon polyp 06/08/2022   Multiple, most recent colonoscopy 2024.  Per patient she will be getting another colonoscopy in 2026.   Elevated transaminase level    Essential hypertension    Heart murmur    Hepatic steatosis    History of hiatal hernia    Hypercholesterolemia    Hypothyroidism    Idiopathic urticaria    Osteoporosis    prolia , Dr. 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, right hand 01/19/2022   Varicose vein of leg     Past Surgical History:  Procedure Laterality Date   ABDOMINAL HYSTERECTOMY     APPENDECTOMY  1954   BRONCHIAL BIOPSY  11/04/2019   Procedure: BRONCHIAL BIOPSIES;  Surgeon: Shelah Lamar RAMAN, MD;  Location: [facility] ENDOSCOPY;  Service: Pulmonary;;   BRONCHIAL BRUSHINGS  11/04/2019   Procedure: BRONCHIAL BRUSHINGS;  Surgeon: Shelah Lamar RAMAN, MD;  Location: [facility] ENDOSCOPY;  Service: Pulmonary;;   BRONCHIAL NEEDLE ASPIRATION BIOPSY  11/04/2019   Procedure: BRONCHIAL NEEDLE ASPIRATION BIOPSIES;  Surgeon: Shelah Lamar RAMAN, MD;  Location: [facility] ENDOSCOPY;  Service: Pulmonary;;   BRONCHIAL WASHINGS  11/04/2019    Procedure: BRONCHIAL WASHINGS;  Surgeon: Shelah Lamar RAMAN, MD;  Location: [facility] ENDOSCOPY;  Service: Pulmonary;;   BUNIONECTOMY Bilateral    CHOLECYSTECTOMY     COLONOSCOPY W/ POLYPECTOMY     Total.  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   EYE SURGERY Bilateral 2021   INTERCOSTAL NERVE BLOCK Right 01/15/2020   Procedure: INTERCOSTAL NERVE BLOCK;  Surgeon: Kerrin Elspeth BROCKS, MD;  Location: [facility] OR;  Service: Thoracic;  Laterality: Right;   LYMPH NODE DISSECTION N/A 01/15/2020   Procedure: LYMPH NODE DISSECTION;  Surgeon: Kerrin Elspeth BROCKS, MD;  Location: [facility] OR;  Service: Thoracic;  Laterality: N/A;   REPLACEMENT TOTAL KNEE BILATERAL     Rhythm monitoring  03/2022   Brief SVT but symptomatic events were sinus   SIGMOIDECTOMY     age 58 for intussusception per pt   TONSILLECTOMY     TRANSTHORACIC ECHOCARDIOGRAM     07/2022 normal EF, grd I DD, mild aortic stenosis   VIDEO BRONCHOSCOPY WITH ENDOBRONCHIAL NAVIGATION N/A 11/04/2019   Procedure: VIDEO BRONCHOSCOPY WITH ENDOBRONCHIAL NAVIGATION;  Surgeon: Shelah Lamar RAMAN, MD;  Location: [facility] ENDOSCOPY;  Service: Pulmonary;  Laterality: N/A;   VIDEO BRONCHOSCOPY WITH ENDOBRONCHIAL NAVIGATION N/A 01/15/2020   Procedure:  VIDEO BRONCHOSCOPY WITH ENDOBRONCHIAL NAVIGATION to mark right upper lobe nodule;  Surgeon: Kerrin Elspeth BROCKS, MD;  Location: [facility] OR;  Service: Thoracic;  Laterality: N/A;    Outpatient Medications Prior to Visit  Medication Sig Dispense Refill   amLODipine  (NORVASC ) 10 MG tablet Take 1 tablet (10 mg total) by mouth daily. 90 tablet 3   Ascorbic Acid (VITAMIN C PO) Take 1 tablet by mouth daily.     Cholecalciferol (VITAMIN D3) 250 MCG (10000 UT) capsule Take 10,000 Units by mouth daily.     EPINEPHrine  0.3 mg/0.3 mL IJ SOAJ injection Inject 0.3 mg into the muscle as needed for anaphylaxis.     ezetimibe  (ZETIA ) 10 MG tablet  Take 1 tablet (10 mg total) by mouth daily. 90 tablet 3   fluticasone  (FLONASE ) 50 MCG/ACT nasal spray SHAKE LIQUID AND USE 2 SPRAYS IN EACH NOSTRIL DAILY 16 g 2   levothyroxine  (SYNTHROID ) 50 MCG tablet Take 1 tablet (50 mcg total) by mouth every other day. Alternating with 75 mcg 45 tablet 3   levothyroxine  (SYNTHROID ) 75 MCG tablet Take 1 tablet (75 mcg total) by mouth every other day. Alternating with 50 mcg 45 tablet 3   magnesium oxide (MAG-OX) 400 MG tablet Take 400 mg by mouth daily.     Misc Natural Products (BEET ROOT PO) Take 1 tablet by mouth every other day.     Multiple Vitamins-Minerals (OCUVITE EYE HEALTH FORMULA PO) Take 1 tablet by mouth daily.     OVER THE COUNTER MEDICATION Take 1 tablet by mouth daily. Balance of Nature - Fruits and Vegetables.     acetaminophen  (TYLENOL ) 650 MG CR tablet Take 650 mg by mouth every 8 (eight) hours as needed for pain.     omalizumab  (XOLAIR ) 150 MG/ML prefilled syringe Inject 150 mg into the skin See admin instructions. Every other month.     No facility-administered medications prior to visit.    Allergies  Allergen Reactions   Codeine Nausea Only and Other (See Comments)    Severe headaches    Pravastatin      Joint pain on 20mg  daily   Rosuvastatin  Other (See Comments)    Joint and muscle pain on 10mg  daily   Sulfa Antibiotics Nausea And Vomiting   Sulfacetamide Sodium-Sulfur Other (See Comments)   Voltaren  [Diclofenac  Sodium] Itching and Rash    Review of Systems  As per HPI  PE:    02/20/2024   11:30 AM 01/24/2024   10:58 AM 12/21/2023   10:18 AM  Vitals with BMI  Height 5' 6 5' 6 5' 6  Weight 152 lbs 6 oz 153 lbs 10 oz 151 lbs 10 oz  BMI 24.61 24.8 24.48  Systolic 149 126 875  Diastolic 73 81 70  Pulse 61 77 60     Physical Exam  General: Alert and well-appearing. No further exam today.  LABS:  Last CBC Lab Results  Component Value Date   WBC 5.7 11/01/2023   HGB 14.4 11/01/2023   HCT 42.7 11/01/2023    MCV 89.0 11/01/2023   MCH 30.0 11/01/2023   RDW 13.1 11/01/2023   PLT 278 11/01/2023   Last metabolic panel Lab Results  Component Value Date   GLUCOSE 93 12/21/2023   NA 141 12/21/2023   K 4.7 12/21/2023   CL 106 12/21/2023   CO2 28 12/21/2023   BUN 29 (H) 12/21/2023   CREATININE 0.81 12/21/2023   GFR 69.00 12/21/2023   CALCIUM  9.1 12/21/2023   PROT 6.8  12/21/2023   ALBUMIN 4.4 12/21/2023   BILITOT 0.7 12/21/2023   ALKPHOS 111 12/21/2023   AST 53 (H) 12/21/2023   ALT 47 (H) 12/21/2023   ANIONGAP 7 11/01/2023   IMPRESSION AND PLAN:  Acute cystitis with microhematuria. UA today with 2+ LEU, 1+ blood, otherwise normal. Start Macrobid 100 twice daily x 3 days. Sent urine specimen for culture and sensitivities. Since this is her first UTI I recommend she come back to leave a specimen in 1 week to make sure the microhematuria has cleared.  An After Visit Summary was printed and given to the patient.  FOLLOW UP: No follow-ups on file.  Signed:  Gerlene Hockey, MD           02/20/2024

## 2024-02-21 LAB — URINE CULTURE
MICRO NUMBER:: 17042487
SPECIMEN QUALITY:: ADEQUATE

## 2024-02-22 ENCOUNTER — Ambulatory Visit: Payer: Self-pay | Admitting: Family Medicine

## 2024-02-22 DIAGNOSIS — M545 Low back pain, unspecified: Secondary | ICD-10-CM | POA: Diagnosis not present

## 2024-02-22 MED ORDER — NITROFURANTOIN MONOHYD MACRO 100 MG PO CAPS: 100.0000 mg | ORAL_CAPSULE | Freq: Two times a day (BID) | ORAL | 0 refills | Status: AC

## 2024-02-22 NOTE — Telephone Encounter (Signed)
 No I do not think the lemon water had anything to do with it. I am sending in a prescription now for 3 more days of antibiotics.

## 2024-02-25 ENCOUNTER — Telehealth: Payer: Self-pay | Admitting: Podiatry

## 2024-02-25 ENCOUNTER — Ambulatory Visit (INDEPENDENT_AMBULATORY_CARE_PROVIDER_SITE_OTHER): Admitting: Podiatry

## 2024-02-25 ENCOUNTER — Other Ambulatory Visit (INDEPENDENT_AMBULATORY_CARE_PROVIDER_SITE_OTHER)

## 2024-02-25 VITALS — Ht 66.0 in | Wt 152.4 lb

## 2024-02-25 DIAGNOSIS — N3001 Acute cystitis with hematuria: Secondary | ICD-10-CM | POA: Diagnosis not present

## 2024-02-25 DIAGNOSIS — M76821 Posterior tibial tendinitis, right leg: Secondary | ICD-10-CM

## 2024-02-25 DIAGNOSIS — R3129 Other microscopic hematuria: Secondary | ICD-10-CM

## 2024-02-25 DIAGNOSIS — M2141 Flat foot [pes planus] (acquired), right foot: Secondary | ICD-10-CM | POA: Diagnosis not present

## 2024-02-25 DIAGNOSIS — R3915 Urgency of urination: Secondary | ICD-10-CM

## 2024-02-25 DIAGNOSIS — M2041 Other hammer toe(s) (acquired), right foot: Secondary | ICD-10-CM | POA: Diagnosis not present

## 2024-02-25 LAB — URINALYSIS, ROUTINE W REFLEX MICROSCOPIC
Bilirubin Urine: NEGATIVE
Hgb urine dipstick: NEGATIVE
Ketones, ur: NEGATIVE
Nitrite: NEGATIVE
Specific Gravity, Urine: 1.025 (ref 1.000–1.030)
Total Protein, Urine: NEGATIVE
Urine Glucose: NEGATIVE
Urobilinogen, UA: 0.2 (ref 0.0–1.0)
pH: 6 (ref 5.0–8.0)

## 2024-02-25 NOTE — Progress Notes (Signed)
 Subjective:  Patient ID: Cynthia Rhodes, female    DOB: Nov 25, 1945,  MRN: [id number]  Chief Complaint  Patient presents with   Foot Problem    RM 21 Patient is here to follow up on flat foot / hammertoe R. Patient states pain has improved by 95%, pt is using insole for flat fleet.    78 y.o. female presents with the above complaint. History confirmed with patient.  She is doing well she still doing therapy now for her SI joint which is progressing her hindfoot pain does not bother her anymore.  Worst issue for her is the second hammertoe with pain in the joint  Objective:  Physical Exam: warm, good capillary refill, no trophic changes or ulcerative lesions, normal DP and PT pulses, normal sensory exam, and today no pain in sinus tarsi 5 out of 5 strength in the hindfoot, no pain in the hindfoot, has rigid hammertoe contracture at the PIPJ with pain and swelling around the joint.   Radiographs: Multiple views x-ray of the right foot: X-rays previously taken on 06/19/2023 show significant pes planovalgus deformity with frontal and transverse plane deformity  Study Result  Narrative & Impression  CLINICAL DATA:  Lateral ankle pain and weakness   EXAM: MRI OF THE RIGHT ANKLE WITHOUT CONTRAST   TECHNIQUE: Multiplanar, multisequence MR imaging of the ankle was performed. No intravenous contrast was administered.   COMPARISON:  10/30/2020   FINDINGS: TENDONS   Peroneal: Peroneal longus tendon intact. Peroneal brevis intact.   Posteromedial: Mild tenosynovitis of the posterior tibial tendon. Posterior tibial tendon is intact. Flexor hallucis longus tendon intact. Flexor digitorum longus tendon intact.   Anterior: Tibialis anterior tendon intact. Extensor hallucis longus tendon intact Extensor digitorum longus tendon intact.   Achilles:  Intact.   Plantar Fascia: Thickening of the medial band of the plantar fascia towards the calcaneal insertion consistent with mild  plantar fasciitis. No plantar fascial tear.   LIGAMENTS   Lateral: Anterior talofibular ligament intact. Calcaneofibular ligament intact. Posterior talofibular ligament intact. Anterior and posterior tibiofibular ligaments intact.   Medial: Deltoid ligament intact. Spring ligament intact.   CARTILAGE   Ankle Joint: No joint effusion. Normal ankle mortise. No chondral defect.   Subtalar Joints/Sinus Tarsi: Normal subtalar joints. No subtalar joint effusion. Normal sinus tarsi.   Bones: No aggressive osseous lesion. No fracture or dislocation. Moderate osteoarthritis of the second, third and fourth TMT joints with subchondral marrow edema. Subcortical extra-articular marrow edema in the lateral talus and to lesser extent lateral calcaneus as can be seen with posterolateral ankle impingement.   Soft Tissue: No fluid collection or hematoma. Muscles are normal without edema or atrophy. Tarsal tunnel is normal.   IMPRESSION: 1. Mild tenosynovitis of the posterior tibial tendon. 2. Mild plantar fasciitis. 3. Moderate osteoarthritis of the second, third and fourth TMT joints with subchondral marrow edema.     Electronically Signed   By: Julaine Blanch M.D.   On: 07/13/2023 14:33   Assessment:   1. Hammertoe of right foot   2. Posterior tibial tendon dysfunction (PTTD) of right lower extremity   3. Acquired pes planovalgus, right       Plan:  Patient was evaluated and treated and all questions answered.  Her PTTD and hindfoot pain has improved significantly.  Nonsurgical treatment has been successful for her thus far.  Currently biggest issue for her is the right second semirigid hammertoe.  Has arthritic changes with rigid contracture.  We discussed treatment of this including  surgical correction with PIPJ arthrodesis.  We discussed the risk benefits and potential complications could arise from this including but not limited to pain, swelling, infection, scar, numbness  which may be temporary or permanent, chronic pain, stiffness, nerve pain or damage, wound healing problems, bone healing problems including delayed or non-union.  She understands wished to proceed.  She would like to schedule this in a few months when she has completed her SI joint therapy.  All questions addressed.  Informed consent signed and reviewed.  Surgery be scheduled mutually agreeable date.   Surgical plan:  Procedure: - Right second hammertoe correction  Location: - [facility]  Anesthesia plan: - Sedation with local  Postoperative pain plan: - Tylenol  1000 mg every 6 hours, ibuprofen 600 mg every 6 hours,oxycodone  5 mg 1-2 tabs every 6 hours only as needed  DVT prophylaxis: - None required  WB Restrictions / DME needs: - WBAT in surgical shoe postop    No follow-ups on file.

## 2024-02-25 NOTE — Telephone Encounter (Signed)
 Called and scheduled patient for 05/23/2024 with Dr. Silva. Patient is not on any GLP1 or blood thinners. Pt did receive surgical bag and is aware that [facility] will reach out 24 to 48 hr prior to surgery with her arrival time. Confirmed that pt pharmacy is [facility],[place] in chart.

## 2024-02-28 DIAGNOSIS — M533 Sacrococcygeal disorders, not elsewhere classified: Secondary | ICD-10-CM | POA: Diagnosis not present

## 2024-03-11 DIAGNOSIS — M533 Sacrococcygeal disorders, not elsewhere classified: Secondary | ICD-10-CM | POA: Diagnosis not present

## 2024-03-17 DIAGNOSIS — M533 Sacrococcygeal disorders, not elsewhere classified: Secondary | ICD-10-CM | POA: Diagnosis not present

## 2024-03-24 DIAGNOSIS — M533 Sacrococcygeal disorders, not elsewhere classified: Secondary | ICD-10-CM | POA: Diagnosis not present

## 2024-04-01 ENCOUNTER — Telehealth: Payer: Self-pay

## 2024-04-01 NOTE — Telephone Encounter (Signed)
 Reason for CRM: Pt is calling in and states [facility] is requesting an order for physical therapy for dates 02/28/24-03/29/24 in order for insurance to cover, pt states her previous PCP was the one who ordered PT to begin with but has since retired. Is wondering what could be done and is requesting a call back if possible.

## 2024-04-01 NOTE — Telephone Encounter (Signed)
 Okay for order?

## 2024-04-09 ENCOUNTER — Ambulatory Visit

## 2024-04-24 DIAGNOSIS — L2089 Other atopic dermatitis: Secondary | ICD-10-CM | POA: Diagnosis not present

## 2024-04-24 DIAGNOSIS — L648 Other androgenic alopecia: Secondary | ICD-10-CM | POA: Diagnosis not present

## 2024-04-24 DIAGNOSIS — Z129 Encounter for screening for malignant neoplasm, site unspecified: Secondary | ICD-10-CM | POA: Diagnosis not present

## 2024-04-24 DIAGNOSIS — L570 Actinic keratosis: Secondary | ICD-10-CM | POA: Diagnosis not present

## 2024-04-24 DIAGNOSIS — L218 Other seborrheic dermatitis: Secondary | ICD-10-CM | POA: Diagnosis not present

## 2024-04-24 DIAGNOSIS — L821 Other seborrheic keratosis: Secondary | ICD-10-CM | POA: Diagnosis not present

## 2024-05-01 ENCOUNTER — Telehealth: Payer: Self-pay | Admitting: Podiatry

## 2024-05-01 NOTE — Telephone Encounter (Signed)
 Patient called in regards tyo upcoming appointment and was transferred to my line. I answered and patient had no recollection of scheduling surgery. She has opted to cancel, [facility] notified

## 2024-05-15 NOTE — Progress Notes (Signed)
 OFFICE VISIT  05/16/2024  CC:  Chief Complaint  Patient presents with   Inflammation    Joint and rash on scalp and back; started using medications prescribed by Dr.Edward Smith (Clobetasol Propionate 0.05%, Diclofenac  Sodium 1%, Triamcinolone  0.1%, Dutasteride 0.5mg  ) starting 12/5    Patient is a 78 y.o. female who presents for joint concerns.  HPI: Complains of a 2 or 3-week history of burning and aching pain diffusely from shoulders down to feet.  Not so much in the neck or back or hips/pelvis.  No swelling.  She has had a couple of itchy patches of dry flaky rash on the scalp and upper back recently.  Saw dermatologist and was told it was eczema and was given some steroid cream.  No home blood pressure measurements lately. No fevers, oral ulcers, eye pain or redness, hives, pustules, vesicles, malaise, or abnormal weight loss. No nausea, vomiting, abdominal pain, constipation, or diarrhea.   She does have a remote history of recurrent hives (idiopathic) which she says that she was put on Xolair  for in the past.  She says she weaned herself off of the Xolair  approximately 6 to 9 months ago and has had no recurrence.   Past Medical History:  Diagnosis Date   Carcinoid tumor of lung (HCC)    resection 2021 (right middle lobectomy and right upper lobe wedge resection)   Colon polyp 06/08/2022   Multiple, most recent colonoscopy 2024.  Per patient she will be getting another colonoscopy in 2026.   Elevated transaminase level    Essential hypertension    Heart murmur    Hepatic steatosis    History of hiatal hernia    Hypercholesterolemia    Hypothyroidism    Idiopathic urticaria    Osteoporosis    prolia , Dr. 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, right hand 01/19/2022    Varicose vein of leg     Past Surgical History:  Procedure Laterality Date   ABDOMINAL HYSTERECTOMY     APPENDECTOMY  1954   BRONCHIAL BIOPSY  11/04/2019   Procedure: BRONCHIAL BIOPSIES;  Surgeon: Shelah Lamar RAMAN, MD;  Location: [facility] ENDOSCOPY;  Service: Pulmonary;;   BRONCHIAL BRUSHINGS  11/04/2019   Procedure: BRONCHIAL BRUSHINGS;  Surgeon: Shelah Lamar RAMAN, MD;  Location: [facility] ENDOSCOPY;  Service: Pulmonary;;   BRONCHIAL NEEDLE ASPIRATION BIOPSY  11/04/2019   Procedure: BRONCHIAL NEEDLE ASPIRATION BIOPSIES;  Surgeon: Shelah Lamar RAMAN, MD;  Location: [facility] ENDOSCOPY;  Service: Pulmonary;;   BRONCHIAL WASHINGS  11/04/2019   Procedure: BRONCHIAL WASHINGS;  Surgeon: Shelah Lamar RAMAN, MD;  Location: [facility] ENDOSCOPY;  Service: Pulmonary;;   BUNIONECTOMY Bilateral    CHOLECYSTECTOMY     COLONOSCOPY W/ POLYPECTOMY     Total.  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   EYE SURGERY Bilateral 2021   INTERCOSTAL NERVE BLOCK Right 01/15/2020   Procedure: INTERCOSTAL NERVE BLOCK;  Surgeon: Kerrin Elspeth BROCKS, MD;  Location: [facility] OR;  Service: Thoracic;  Laterality: Right;   LYMPH NODE DISSECTION N/A 01/15/2020   Procedure: LYMPH NODE DISSECTION;  Surgeon: Kerrin Elspeth BROCKS, MD;  Location: [facility] OR;  Service: Thoracic;  Laterality: N/A;  REPLACEMENT TOTAL KNEE BILATERAL     Rhythm monitoring  03/2022   Brief SVT but symptomatic events were sinus   SIGMOIDECTOMY     age 12 for intussusception per pt   TONSILLECTOMY     TRANSTHORACIC ECHOCARDIOGRAM     07/2022 normal EF, grd I DD, mild aortic stenosis   VIDEO BRONCHOSCOPY WITH ENDOBRONCHIAL NAVIGATION N/A 11/04/2019   Procedure: VIDEO BRONCHOSCOPY WITH ENDOBRONCHIAL NAVIGATION;  Surgeon: Shelah Lamar RAMAN, MD;  Location: [facility] ENDOSCOPY;  Service: Pulmonary;  Laterality: N/A;   VIDEO BRONCHOSCOPY WITH ENDOBRONCHIAL NAVIGATION N/A 01/15/2020   Procedure: VIDEO  BRONCHOSCOPY WITH ENDOBRONCHIAL NAVIGATION to mark right upper lobe nodule;  Surgeon: Kerrin Elspeth BROCKS, MD;  Location: [facility] OR;  Service: Thoracic;  Laterality: N/A;    Outpatient Medications Prior to Visit  Medication Sig Dispense Refill   amLODipine  (NORVASC ) 10 MG tablet Take 1 tablet (10 mg total) by mouth daily. 90 tablet 3   Ascorbic Acid (VITAMIN C PO) Take 1 tablet by mouth daily.     Cholecalciferol (VITAMIN D3) 250 MCG (10000 UT) capsule Take 10,000 Units by mouth daily.     dutasteride (AVODART) 0.5 MG capsule Take 0.5 mg by mouth daily.     EPINEPHrine  0.3 mg/0.3 mL IJ SOAJ injection Inject 0.3 mg into the muscle as needed for anaphylaxis.     estradiol (VIVELLE-DOT) 0.025 MG/24HR Place 1 patch onto the skin 2 (two) times a week.     ezetimibe  (ZETIA ) 10 MG tablet Take 1 tablet (10 mg total) by mouth daily. 90 tablet 3   fluticasone  (FLONASE ) 50 MCG/ACT nasal spray SHAKE LIQUID AND USE 2 SPRAYS IN EACH NOSTRIL DAILY 16 g 2   levothyroxine  (SYNTHROID ) 50 MCG tablet Take 1 tablet (50 mcg total) by mouth every other day. Alternating with 75 mcg 45 tablet 3   levothyroxine  (SYNTHROID ) 75 MCG tablet Take 1 tablet (75 mcg total) by mouth every other day. Alternating with 50 mcg 45 tablet 3   magnesium oxide (MAG-OX) 400 MG tablet Take 400 mg by mouth daily.     Misc Natural Products (BEET ROOT PO) Take 1 tablet by mouth every other day.     Multiple Vitamins-Minerals (OCUVITE EYE HEALTH FORMULA PO) Take 1 tablet by mouth daily.     OVER THE COUNTER MEDICATION Take 1 tablet by mouth daily. Balance of Nature - Fruits and Vegetables.     progesterone (PROMETRIUM) 100 MG capsule TAKE 1 CAPSULE BY MOUTH EVERY NIGHT. INCREASE AFTER 1-2 WEEKS TO 2 CAPSULES     tiZANidine (ZANAFLEX) 4 MG tablet Take 4 mg by mouth 2 (two) times daily as needed.     triamcinolone  cream (KENALOG ) 0.1 % Apply 1 Application topically 2 (two) times daily.     No facility-administered medications prior to visit.     Allergies[1]  Review of Systems  As per HPI  PE:    05/16/2024    3:45 PM 05/16/2024    3:38 PM 02/25/2024    9:18 AM  Vitals with BMI  Height  5' 6 5' 6  Weight  152 lbs 3 oz 152 lbs 6 oz  BMI  24.58 24.61  Systolic 166 179   Diastolic 81 83   Pulse  63      Physical Exam  Gen: Alert, well appearing.  Patient is oriented to person, place, time, and situation. AFFECT: pleasant, lucid thought and speech. Skin: No rash.  Scalp: No rash. Musculoskeletal: She has no joint swelling, redness, or warmth.  She does have some tenderness to palpation diffusely from the tops of her shoulders down both sides all the way to the feet.  The tenderness is mostly around the joint regions but vague enough that it may involve some soft tissues as well.  LABS:  Last CBC Lab Results  Component Value Date   WBC 5.7 11/01/2023   HGB 14.4 11/01/2023   HCT 42.7 11/01/2023   MCV 89.0 11/01/2023   MCH 30.0 11/01/2023   RDW 13.1 11/01/2023   PLT 278 11/01/2023   Last metabolic panel Lab Results  Component Value Date   GLUCOSE 93 12/21/2023   NA 141 12/21/2023   K 4.7 12/21/2023   CL 106 12/21/2023   CO2 28 12/21/2023   BUN 29 (H) 12/21/2023   CREATININE 0.81 12/21/2023   GFR 69.00 12/21/2023   CALCIUM  9.1 12/21/2023   PROT 6.8 12/21/2023   ALBUMIN 4.4 12/21/2023   BILITOT 0.7 12/21/2023   ALKPHOS 111 12/21/2023   AST 53 (H) 12/21/2023   ALT 47 (H) 12/21/2023   ANIONGAP 7 11/01/2023   Last hemoglobin A1c Lab Results  Component Value Date   HGBA1C 6.2 12/21/2023   Last thyroid  functions Lab Results  Component Value Date   TSH 1.65 12/21/2023   Lab Results  Component Value Date   ESRSEDRATE 10 04/24/2020   IMPRESSION AND PLAN:  #1 polyarthralgias, question myalgias as well. Unknown etiology. Will check ANA panel, sed rate, CRP, CBC, CMET.  #2 eczema.  Reassured.  Encouraged her to use her clobetasol propionate 0.0 0.5% topical as prescribed by the dermatologist  recently. No sign of any psoriasis.  3.  Elevated blood pressure. She does take her amlodipine  10 mg a day. I will have her start monitoring her blood pressures at home more and will recheck her in 2 weeks.  An After Visit Summary was printed and given to the patient.  FOLLOW UP: 2 wks Has appointment set 06/23/24 Signed:  Gerlene Hockey, MD           05/16/2024     [1]  Allergies Allergen Reactions   Codeine Nausea Only and Other (See Comments)    Severe headaches    Pravastatin      Joint pain on 20mg  daily   Rosuvastatin  Other (See Comments)    Joint and muscle pain on 10mg  daily   Sulfa Antibiotics Nausea And Vomiting   Sulfacetamide Sodium-Sulfur Other (See Comments)   Voltaren  [Diclofenac  Sodium] Itching and Rash

## 2024-05-16 ENCOUNTER — Encounter: Payer: Self-pay | Admitting: Family Medicine

## 2024-05-16 ENCOUNTER — Ambulatory Visit: Admitting: Family Medicine

## 2024-05-16 VITALS — BP 166/81 | HR 63 | Temp 97.2°F | Ht 66.0 in | Wt 152.2 lb

## 2024-05-16 DIAGNOSIS — E039 Hypothyroidism, unspecified: Secondary | ICD-10-CM

## 2024-05-16 DIAGNOSIS — I10 Essential (primary) hypertension: Secondary | ICD-10-CM

## 2024-05-16 DIAGNOSIS — M130 Polyarthritis, unspecified: Secondary | ICD-10-CM

## 2024-05-16 DIAGNOSIS — R030 Elevated blood-pressure reading, without diagnosis of hypertension: Secondary | ICD-10-CM

## 2024-05-18 ENCOUNTER — Ambulatory Visit: Payer: Self-pay | Admitting: Family Medicine

## 2024-05-23 LAB — COMPREHENSIVE METABOLIC PANEL WITH GFR
AG Ratio: 1.7 (calc) (ref 1.0–2.5)
ALT: 28 U/L (ref 6–29)
AST: 36 U/L — ABNORMAL HIGH (ref 10–35)
Albumin: 4.3 g/dL (ref 3.6–5.1)
Alkaline phosphatase (APISO): 106 U/L (ref 37–153)
BUN: 21 mg/dL (ref 7–25)
CO2: 28 mmol/L (ref 20–32)
Calcium: 9.7 mg/dL (ref 8.6–10.4)
Chloride: 103 mmol/L (ref 98–110)
Creat: 0.83 mg/dL (ref 0.60–1.00)
Globulin: 2.6 g/dL (ref 1.9–3.7)
Glucose, Bld: 109 mg/dL — ABNORMAL HIGH (ref 65–99)
Potassium: 4 mmol/L (ref 3.5–5.3)
Sodium: 139 mmol/L (ref 135–146)
Total Bilirubin: 0.5 mg/dL (ref 0.2–1.2)
Total Protein: 6.9 g/dL (ref 6.1–8.1)
eGFR: 72 mL/min/1.73m2

## 2024-05-23 LAB — CBC WITH DIFFERENTIAL/PLATELET
Absolute Lymphocytes: 1715 {cells}/uL (ref 850–3900)
Absolute Monocytes: 757 {cells}/uL (ref 200–950)
Basophils Absolute: 74 {cells}/uL (ref 0–200)
Basophils Relative: 1.1 %
Eosinophils Absolute: 275 {cells}/uL (ref 15–500)
Eosinophils Relative: 4.1 %
HCT: 42.2 % (ref 35.9–46.0)
Hemoglobin: 13.6 g/dL (ref 11.7–15.5)
MCH: 29.3 pg (ref 27.0–33.0)
MCHC: 32.2 g/dL (ref 31.6–35.4)
MCV: 90.9 fL (ref 81.4–101.7)
MPV: 12.6 fL — ABNORMAL HIGH (ref 7.5–12.5)
Monocytes Relative: 11.3 %
Neutro Abs: 3879 {cells}/uL (ref 1500–7800)
Neutrophils Relative %: 57.9 %
Platelets: 343 Thousand/uL (ref 140–400)
RBC: 4.64 Million/uL (ref 3.80–5.10)
RDW: 12.2 % (ref 11.0–15.0)
Total Lymphocyte: 25.6 %
WBC: 6.7 Thousand/uL (ref 3.8–10.8)

## 2024-05-23 LAB — TIER 1
Chromatin (Nucleosomal) Antibody: <1 AI
ENA SM Ab Ser-aCnc: <1 AI
Ribonucleic Protein(ENA) Antibody, IgG: <1 AI
SM/RNP: <1 AI
ds DNA Ab: 1 [IU]/mL

## 2024-05-23 LAB — ANA SCREEN,IFA,REFLEX TITER/PATTERN,REFLEX MPLX 11 AB CASCADE
Anti Nuclear Antibody (ANA): POSITIVE — AB
Cyclic Citrullin Peptide Ab: <16 U
MUTATED CITRULLINATED VIMENTIN (MCV) AB: <20 U/mL
Rheumatoid fact SerPl-aCnc: 14 [IU]/mL — ABNORMAL HIGH

## 2024-05-23 LAB — ANTI-NUCLEAR AB-TITER (ANA TITER): ANA Titer 1: 1:640 {titer} — ABNORMAL HIGH

## 2024-05-23 LAB — TIER 2
Jo-1 Autoabs: <1 AI
SSA (Ro) (ENA) Antibody, IgG: <1 AI
SSB (La) (ENA) Antibody, IgG: <1 AI
Scleroderma (Scl-70) (ENA) Antibody, IgG: <1 AI

## 2024-05-23 LAB — SEDIMENTATION RATE: Sed Rate: 6 mm/h (ref 0–30)

## 2024-05-23 LAB — TSH: TSH: 1.7 m[IU]/L (ref 0.40–4.50)

## 2024-05-23 LAB — TIER 3
Centromere Ab Screen: <1 AI
Ribosomal P Protein Ab: <1 AI

## 2024-05-23 LAB — C-REACTIVE PROTEIN: CRP: <3 mg/L

## 2024-05-23 LAB — INTERPRETATION

## 2024-05-29 ENCOUNTER — Encounter: Payer: Self-pay | Admitting: Family Medicine

## 2024-05-29 ENCOUNTER — Ambulatory Visit (INDEPENDENT_AMBULATORY_CARE_PROVIDER_SITE_OTHER): Admitting: Family Medicine

## 2024-05-29 ENCOUNTER — Encounter

## 2024-05-29 VITALS — BP 148/70 | HR 65 | Temp 96.4°F | Wt 155.2 lb

## 2024-05-29 DIAGNOSIS — I10 Essential (primary) hypertension: Secondary | ICD-10-CM

## 2024-05-29 DIAGNOSIS — R7689 Other specified abnormal immunological findings in serum: Secondary | ICD-10-CM | POA: Diagnosis not present

## 2024-05-29 DIAGNOSIS — K8309 Other cholangitis: Secondary | ICD-10-CM

## 2024-05-29 LAB — CK: Total CK: 182 U/L — ABNORMAL HIGH (ref 17–177)

## 2024-05-29 NOTE — Progress Notes (Signed)
 OFFICE VISIT  05/29/2024  CC: No chief complaint on file.   Patient is a 79 y.o. female who presents for 2-week follow-up widespread musculoskeletal pain and elevated blood pressure. A/P as of last visit: #1 polyarthralgias, question myalgias as well. Unknown etiology. Will check ANA panel, sed rate, CRP, CBC, CMET.   #2 eczema.  Reassured.  Encouraged her to use her clobetasol propionate 0.0 0.5% topical as prescribed by the dermatologist recently. No sign of any psoriasis.   3.  Elevated blood pressure. She does take her amlodipine  10 mg a day. I will have her start monitoring her blood pressures at home more and will recheck her in 2 weeks.  INTERIM HX: Her pain is all gone. She says she has a long history of feeling some pain in the right upper quadrant.  She had always felt like this possibly was scar tissue since having surgery for intussusception as a 60-year-old. The pain seems to wax and wane and is not clearly associated with any trigger. She feels like it is gradually getting worse over time. ANA pos 1:640, cytoplasmic, reticular/AMA staining pattern.  All of her OTHER labs were normal/negative.  Regarding her blood pressure, she says she has not monitored her pressure since last visit but says she had not been taking the medication regularly at the time of last visit.  She says now she is taking amlodipine  10 mg every day.   ROS as above, plus--> gradual onset short-term memory loss progressing over the last several years. no fevers, no nausea, vomiting, diarrhea, or constipation. No CP, no SOB, no wheezing, no cough, no dizziness, no HAs, no rashes, no melena/hematochezia.  No polyuria or polydipsia.  No myalgias or arthralgias.  No focal weakness, paresthesias, or tremors.  No acute vision or hearing abnormalities.  No dysuria or unusual/new urinary urgency or frequency.  No recent changes in lower legs.  No palpitations.    Past Medical History:  Diagnosis Date    Carcinoid tumor of lung (HCC)    resection 2021 (right middle lobectomy and right upper lobe wedge resection)   Colon polyp 06/08/2022   Multiple, most recent colonoscopy 2024.  Per patient she will be getting another colonoscopy in 2026.   Elevated transaminase level    Essential hypertension    Heart murmur    Hepatic steatosis    History of hiatal hernia    Hypercholesterolemia    Hypothyroidism    Idiopathic urticaria    Osteoporosis    prolia , Dr. 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, right hand 01/19/2022   Varicose vein of leg     Past Surgical History:  Procedure Laterality Date   ABDOMINAL HYSTERECTOMY     APPENDECTOMY  1954   BRONCHIAL BIOPSY  11/04/2019   Procedure: BRONCHIAL BIOPSIES;  Surgeon: Shelah Lamar RAMAN, MD;  Location: [facility] ENDOSCOPY;  Service: Pulmonary;;   BRONCHIAL BRUSHINGS  11/04/2019   Procedure: BRONCHIAL BRUSHINGS;  Surgeon: Shelah Lamar RAMAN, MD;  Location: [facility] ENDOSCOPY;  Service: Pulmonary;;   BRONCHIAL NEEDLE ASPIRATION BIOPSY  11/04/2019   Procedure: BRONCHIAL NEEDLE ASPIRATION BIOPSIES;  Surgeon: Shelah Lamar RAMAN, MD;  Location: [facility] ENDOSCOPY;  Service: Pulmonary;;   BRONCHIAL WASHINGS  11/04/2019   Procedure: BRONCHIAL WASHINGS;  Surgeon: Shelah Lamar RAMAN,  MD;  Location: [facility] ENDOSCOPY;  Service: Pulmonary;;   BUNIONECTOMY Bilateral    CHOLECYSTECTOMY     COLONOSCOPY W/ POLYPECTOMY     Total.  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   EYE SURGERY Bilateral 2021   INTERCOSTAL NERVE BLOCK Right 01/15/2020   Procedure: INTERCOSTAL NERVE BLOCK;  Surgeon: Kerrin Elspeth BROCKS, MD;  Location: [facility] OR;  Service: Thoracic;  Laterality: Right;   LYMPH NODE  DISSECTION N/A 01/15/2020   Procedure: LYMPH NODE DISSECTION;  Surgeon: Kerrin Elspeth BROCKS, MD;  Location: [facility] OR;  Service: Thoracic;  Laterality: N/A;   REPLACEMENT TOTAL KNEE BILATERAL     Rhythm monitoring  03/2022   Brief SVT but symptomatic events were sinus   SIGMOIDECTOMY     age 79 for intussusception per pt   TONSILLECTOMY     TRANSTHORACIC ECHOCARDIOGRAM     07/2022 normal EF, grd I DD, mild aortic stenosis   VIDEO BRONCHOSCOPY WITH ENDOBRONCHIAL NAVIGATION N/A 11/04/2019   Procedure: VIDEO BRONCHOSCOPY WITH ENDOBRONCHIAL NAVIGATION;  Surgeon: Shelah Lamar RAMAN, MD;  Location: [facility] ENDOSCOPY;  Service: Pulmonary;  Laterality: N/A;   VIDEO BRONCHOSCOPY WITH ENDOBRONCHIAL NAVIGATION N/A 01/15/2020   Procedure: VIDEO BRONCHOSCOPY WITH ENDOBRONCHIAL NAVIGATION to mark right upper lobe nodule;  Surgeon: Kerrin Elspeth BROCKS, MD;  Location: [facility] OR;  Service: Thoracic;  Laterality: N/A;    Outpatient Medications Prior to Visit  Medication Sig Dispense Refill   amLODipine  (NORVASC ) 10 MG tablet Take 1 tablet (10 mg total) by mouth daily. 90 tablet 3   Ascorbic Acid (VITAMIN C PO) Take 1 tablet by mouth daily.     Cholecalciferol (VITAMIN D3) 250 MCG (10000 UT) capsule Take 10,000 Units by mouth daily.     dutasteride (AVODART) 0.5 MG capsule Take 0.5 mg by mouth daily.     EPINEPHrine  0.3 mg/0.3 mL IJ SOAJ injection Inject 0.3 mg into the muscle as needed for anaphylaxis.     estradiol (VIVELLE-DOT) 0.025 MG/24HR Place 1 patch onto the skin 2 (two) times a week.     ezetimibe  (ZETIA ) 10 MG tablet Take 1 tablet (10 mg total) by mouth daily. 90 tablet 3   fluticasone  (FLONASE ) 50 MCG/ACT nasal spray SHAKE LIQUID AND USE 2 SPRAYS IN EACH NOSTRIL DAILY 16 g 2   levothyroxine  (SYNTHROID ) 50 MCG tablet Take 1 tablet (50 mcg total) by mouth every other day. Alternating with 75 mcg 45 tablet 3   levothyroxine  (SYNTHROID ) 75 MCG tablet Take 1 tablet (75 mcg total) by mouth every other day.  Alternating with 50 mcg 45 tablet 3   magnesium oxide (MAG-OX) 400 MG tablet Take 400 mg by mouth daily.     Misc Natural Products (BEET ROOT PO) Take 1 tablet by mouth every other day.     Multiple Vitamins-Minerals (OCUVITE EYE HEALTH FORMULA PO) Take 1 tablet by mouth daily.     OVER THE COUNTER MEDICATION Take 1 tablet by mouth daily. Balance of Nature - Fruits and Vegetables.     progesterone (PROMETRIUM) 100 MG capsule TAKE 1 CAPSULE BY MOUTH EVERY NIGHT. INCREASE AFTER 1-2 WEEKS TO 2 CAPSULES     tiZANidine (ZANAFLEX) 4 MG tablet Take 4 mg by mouth 2 (two) times daily as needed.     triamcinolone  cream (KENALOG ) 0.1 % Apply 1 Application topically 2 (two) times daily.     No facility-administered  medications prior to visit.    Allergies[1]  Review of Systems As per HPI  PE:    05/29/2024    1:29 PM 05/29/2024    1:15 PM 05/16/2024    3:45 PM  Vitals with BMI  Weight  155 lbs 3 oz   BMI  25.06   Systolic 148 150 833  Diastolic 70 72 81  Pulse  65      Physical Exam  Gen: Alert, well appearing.  Patient is oriented to person, place, time, and situation. AFFECT: pleasant, lucid thought and speech. ZWU:Zbzd: no injection, icteris, swelling, or exudate.  EOMI, PERRLA. Mouth: lips without lesion/swelling.  Oral mucosa pink and moist. Oropharynx without erythema, exudate, or swelling.  CV: RRR, no m/r/g.   LUNGS: CTA bilat, nonlabored resps, good aeration in all lung fields. ABD: soft, mild RUQ TTP no abdominal distention, no mass.  No guarding or rebound tenderness.  No HSM or bruit. EXT: no clubbing or cyanosis.  No pitting edema.  Neuro: CN 2-12 intact bilaterally, strength 5/5 in proximal and distal upper extremities and lower extremities bilaterally.  No resting tremor.  FNF normal bilat.  No ataxia.  Upper extremity and lower extremity DTRs symmetric.  No pronator drift.  LABS:  Last CBC Lab Results  Component Value Date   WBC 6.7 05/16/2024   HGB 13.6 05/16/2024    HCT 42.2 05/16/2024   MCV 90.9 05/16/2024   MCH 29.3 05/16/2024   RDW 12.2 05/16/2024   PLT 343 05/16/2024   Last metabolic panel Lab Results  Component Value Date   GLUCOSE 109 (H) 05/16/2024   NA 139 05/16/2024   K 4.0 05/16/2024   CL 103 05/16/2024   CO2 28 05/16/2024   BUN 21 05/16/2024   CREATININE 0.83 05/16/2024   EGFR 72 05/16/2024   CALCIUM  9.7 05/16/2024   PROT 6.9 05/16/2024   ALBUMIN 4.4 12/21/2023   BILITOT 0.5 05/16/2024   ALKPHOS 111 12/21/2023   AST 36 (H) 05/16/2024   ALT 28 05/16/2024   ANIONGAP 7 11/01/2023   Lab Results  Component Value Date   ANA POSITIVE (A) 05/16/2024   Lab Results  Component Value Date   ESRSEDRATE 6 05/16/2024   Lab Results  Component Value Date   CRP <3.0 05/16/2024   IMPRESSION AND PLAN:  #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 ANA pos 1:640, cytoplasmic, reticular/AMA staining pattern.  All of her OTHER labs were normal/negative (05/16/24). I initially checked an ANA/autoimmune panel when she had musculoskeletal pain. The pain has resolved and now she gives a history of some right upper quadrant pain. Will check mitochondrial antibody.  Will ask gastroenterology to evaluate her.  #2 hypertension. Blood pressure up here today. It is not clear whether she is 100% compliant with her medications.  We went over this again today and we will hold off from adding any additional medication at this time.  Continue amlodipine  10 mg a day.   An After Visit Summary was printed and given to the patient.  FOLLOW UP: No follow-ups on file.  Signed:  Gerlene Hockey, MD           05/29/2024      [1]  Allergies Allergen Reactions   Codeine Nausea Only and Other (See Comments)    Severe headaches    Pravastatin      Joint pain on 20mg  daily   Rosuvastatin  Other (See Comments)    Joint and muscle pain on 10mg  daily   Sulfa Antibiotics Nausea And Vomiting  Sulfacetamide Sodium-Sulfur Other (See Comments)   Voltaren  [Diclofenac  Sodium]  Itching and Rash

## 2024-06-02 LAB — ANTI-SMOOTH MUSCLE ANTIBODY, IGG: Actin (Smooth Muscle) Antibody (IGG): <20 U

## 2024-06-02 LAB — MITOCHONDRIAL ANTIBODIES: Mitochondrial M2 Ab, IgG: 127 U — ABNORMAL HIGH

## 2024-06-12 ENCOUNTER — Encounter

## 2024-06-20 ENCOUNTER — Other Ambulatory Visit (HOSPITAL_COMMUNITY): Payer: Self-pay

## 2024-06-20 ENCOUNTER — Encounter: Payer: Self-pay | Admitting: Gastroenterology

## 2024-06-20 ENCOUNTER — Telehealth: Payer: Self-pay | Admitting: Gastroenterology

## 2024-06-20 ENCOUNTER — Encounter: Payer: Self-pay | Admitting: Radiology

## 2024-06-20 DIAGNOSIS — R7689 Other specified abnormal immunological findings in serum: Secondary | ICD-10-CM

## 2024-06-20 DIAGNOSIS — I771 Stricture of artery: Secondary | ICD-10-CM

## 2024-06-20 DIAGNOSIS — R7989 Other specified abnormal findings of blood chemistry: Secondary | ICD-10-CM

## 2024-06-20 DIAGNOSIS — R1011 Right upper quadrant pain: Secondary | ICD-10-CM

## 2024-06-20 NOTE — Progress Notes (Signed)
 Karalee Wilkie POUR, MD  Daralene Ferol FALCON, RT Random liver biopsy, no review needed.  HKM       Previous Messages    ----- Message ----- From: Daralene Ferol FALCON, RT Sent: 06/20/2024  11:48 AM EST To: Ir Procedure Requests Subject: US  Guided Needle Placement                    Procedure :US  Guided Needle Placement  Reason :RUQ pain, Abnormal LFT's, Hepatic Stenosis, Elevated ANA level Dx: Abdominal pain, RUQ [R10.11 (ICD-10-CM)]; Abnormal LFTs [R79.89 (ICD-10-CM)]; Hepatic artery stenosis [I77.1 (ICD-10-CM)]; Elevated antinuclear antibody (ANA) level [R76.89 (ICD-10-CM)]  History :US  Abdomen Limited RUQ (LIVER/GB) (Accession [id number]) (Order [id number])  Provider: Leonette Rad  Provider contact ; [phone]

## 2024-06-20 NOTE — Telephone Encounter (Signed)
 Error

## 2024-06-23 ENCOUNTER — Ambulatory Visit: Admitting: Family Medicine

## 2024-07-03 ENCOUNTER — Encounter: Admitting: Podiatry

## 2024-07-10 ENCOUNTER — Ambulatory Visit: Admitting: Family Medicine

## 2024-07-10 ENCOUNTER — Ambulatory Visit (HOSPITAL_COMMUNITY)

## 2024-07-14 ENCOUNTER — Ambulatory Visit: Admitting: Family Medicine

## 2024-07-16 ENCOUNTER — Ambulatory Visit: Admitting: Gastroenterology

## 2024-07-16 ENCOUNTER — Ambulatory Visit

## 2024-10-28 ENCOUNTER — Other Ambulatory Visit

## 2024-11-06 ENCOUNTER — Ambulatory Visit: Admitting: Internal Medicine

## 2024-11-10 ENCOUNTER — Other Ambulatory Visit

## 2024-11-10 ENCOUNTER — Ambulatory Visit: Admitting: Internal Medicine
